# Patient Record
Sex: Female | Born: 1955
Health system: Southern US, Community
[De-identification: ages and names within clinical notes are randomized; demographics above are authoritative.]

## PROBLEM LIST (undated history)

## (undated) DIAGNOSIS — F329 Major depressive disorder, single episode, unspecified: Secondary | ICD-10-CM

## (undated) DIAGNOSIS — G44209 Tension-type headache, unspecified, not intractable: Secondary | ICD-10-CM

## (undated) DIAGNOSIS — T7840XA Allergy, unspecified, initial encounter: Secondary | ICD-10-CM

## (undated) DIAGNOSIS — I10 Essential (primary) hypertension: Secondary | ICD-10-CM

## (undated) DIAGNOSIS — F32A Depression, unspecified: Secondary | ICD-10-CM

## (undated) DIAGNOSIS — K635 Polyp of colon: Secondary | ICD-10-CM

## (undated) DIAGNOSIS — M1990 Unspecified osteoarthritis, unspecified site: Secondary | ICD-10-CM

## (undated) DIAGNOSIS — K529 Noninfective gastroenteritis and colitis, unspecified: Secondary | ICD-10-CM

## (undated) DIAGNOSIS — E785 Hyperlipidemia, unspecified: Secondary | ICD-10-CM

## (undated) DIAGNOSIS — K5792 Diverticulitis of intestine, part unspecified, without perforation or abscess without bleeding: Secondary | ICD-10-CM

## (undated) DIAGNOSIS — K589 Irritable bowel syndrome without diarrhea: Secondary | ICD-10-CM

## (undated) DIAGNOSIS — K5790 Diverticulosis of intestine, part unspecified, without perforation or abscess without bleeding: Secondary | ICD-10-CM

## (undated) DIAGNOSIS — F419 Anxiety disorder, unspecified: Secondary | ICD-10-CM

## (undated) DIAGNOSIS — Q2730 Arteriovenous malformation, site unspecified: Secondary | ICD-10-CM

## (undated) HISTORY — PX: SHOULDER SURGERY: SHX246

## (undated) HISTORY — DX: Arteriovenous malformation, site unspecified: Q27.30

## (undated) HISTORY — DX: Diverticulitis of intestine, part unspecified, without perforation or abscess without bleeding: K57.92

## (undated) HISTORY — PX: REDUCTION MAMMAPLASTY: SUR839

## (undated) HISTORY — PX: UPPER GASTROINTESTINAL ENDOSCOPY: SHX188

## (undated) HISTORY — DX: Irritable bowel syndrome, unspecified: K58.9

## (undated) HISTORY — DX: Tension-type headache, unspecified, not intractable: G44.209

## (undated) HISTORY — DX: Hyperlipidemia, unspecified: E78.5

## (undated) HISTORY — PX: ORTHOPEDIC SURGERY: SHX850

## (undated) HISTORY — DX: Anxiety disorder, unspecified: F41.9

## (undated) HISTORY — PX: BREAST ENHANCEMENT SURGERY: SHX7

## (undated) HISTORY — PX: COLONOSCOPY: SHX174

## (undated) HISTORY — DX: Major depressive disorder, single episode, unspecified: F32.9

## (undated) HISTORY — DX: Unspecified osteoarthritis, unspecified site: M19.90

## (undated) HISTORY — PX: BREAST SURGERY: SHX581

## (undated) HISTORY — PX: BREAST EXCISIONAL BIOPSY: SUR124

## (undated) HISTORY — DX: Allergy, unspecified, initial encounter: T78.40XA

## (undated) HISTORY — PX: ABDOMINAL HYSTERECTOMY: SHX81

## (undated) HISTORY — DX: Polyp of colon: K63.5

## (undated) HISTORY — DX: Essential (primary) hypertension: I10

## (undated) HISTORY — DX: Diverticulosis of intestine, part unspecified, without perforation or abscess without bleeding: K57.90

## (undated) HISTORY — DX: Depression, unspecified: F32.A

## (undated) HISTORY — PX: TUBAL LIGATION: SHX77

## (undated) HISTORY — DX: Noninfective gastroenteritis and colitis, unspecified: K52.9

---

## 1978-04-23 HISTORY — PX: BREAST SURGERY: SHX581

## 1983-04-24 HISTORY — PX: ABDOMINAL HYSTERECTOMY: SHX81

## 1998-04-23 HISTORY — PX: BUNIONECTOMY: SHX129

## 1998-04-23 HISTORY — PX: SHOULDER SURGERY: SHX246

## 1998-05-23 ENCOUNTER — Ambulatory Visit (HOSPITAL_COMMUNITY): Admission: RE | Admit: 1998-05-23 | Discharge: 1998-05-23 | Payer: Self-pay | Admitting: Obstetrics

## 1998-05-23 ENCOUNTER — Encounter: Payer: Self-pay | Admitting: Obstetrics

## 1999-05-10 ENCOUNTER — Other Ambulatory Visit: Admission: RE | Admit: 1999-05-10 | Discharge: 1999-05-10 | Payer: Self-pay | Admitting: Obstetrics

## 2000-08-30 ENCOUNTER — Ambulatory Visit (HOSPITAL_BASED_OUTPATIENT_CLINIC_OR_DEPARTMENT_OTHER): Admission: RE | Admit: 2000-08-30 | Discharge: 2000-08-30 | Payer: Self-pay | Admitting: Orthopedic Surgery

## 2000-10-03 ENCOUNTER — Other Ambulatory Visit: Admission: RE | Admit: 2000-10-03 | Discharge: 2000-10-03 | Payer: Self-pay | Admitting: Obstetrics

## 2000-10-16 ENCOUNTER — Encounter: Payer: Self-pay | Admitting: Obstetrics

## 2000-10-16 ENCOUNTER — Ambulatory Visit (HOSPITAL_COMMUNITY): Admission: RE | Admit: 2000-10-16 | Discharge: 2000-10-16 | Payer: Self-pay | Admitting: Obstetrics

## 2003-01-04 ENCOUNTER — Ambulatory Visit (HOSPITAL_COMMUNITY): Admission: RE | Admit: 2003-01-04 | Discharge: 2003-01-04 | Payer: Self-pay | Admitting: Obstetrics

## 2003-01-04 ENCOUNTER — Encounter: Payer: Self-pay | Admitting: Obstetrics

## 2003-09-03 ENCOUNTER — Encounter: Payer: Self-pay | Admitting: Gastroenterology

## 2003-09-09 ENCOUNTER — Encounter: Payer: Self-pay | Admitting: Gastroenterology

## 2003-09-17 ENCOUNTER — Ambulatory Visit (HOSPITAL_COMMUNITY): Admission: RE | Admit: 2003-09-17 | Discharge: 2003-09-17 | Payer: Self-pay | Admitting: Gastroenterology

## 2004-05-10 ENCOUNTER — Ambulatory Visit: Payer: Self-pay | Admitting: Internal Medicine

## 2004-06-30 ENCOUNTER — Ambulatory Visit: Payer: Self-pay | Admitting: Internal Medicine

## 2004-07-17 ENCOUNTER — Ambulatory Visit: Payer: Self-pay | Admitting: Internal Medicine

## 2005-01-08 ENCOUNTER — Ambulatory Visit: Payer: Self-pay | Admitting: Internal Medicine

## 2005-02-05 ENCOUNTER — Ambulatory Visit: Payer: Self-pay | Admitting: Internal Medicine

## 2005-02-08 ENCOUNTER — Ambulatory Visit: Payer: Self-pay | Admitting: Internal Medicine

## 2005-05-04 ENCOUNTER — Ambulatory Visit: Payer: Self-pay | Admitting: Internal Medicine

## 2005-06-08 ENCOUNTER — Ambulatory Visit: Payer: Self-pay | Admitting: Internal Medicine

## 2005-06-27 ENCOUNTER — Ambulatory Visit (HOSPITAL_COMMUNITY): Admission: RE | Admit: 2005-06-27 | Discharge: 2005-06-27 | Payer: Self-pay | Admitting: Internal Medicine

## 2005-07-06 ENCOUNTER — Ambulatory Visit (HOSPITAL_BASED_OUTPATIENT_CLINIC_OR_DEPARTMENT_OTHER): Admission: RE | Admit: 2005-07-06 | Discharge: 2005-07-06 | Payer: Self-pay | Admitting: Internal Medicine

## 2005-07-25 ENCOUNTER — Ambulatory Visit: Payer: Self-pay | Admitting: Pulmonary Disease

## 2005-09-14 ENCOUNTER — Ambulatory Visit: Payer: Self-pay | Admitting: Internal Medicine

## 2005-12-13 ENCOUNTER — Ambulatory Visit: Payer: Self-pay | Admitting: Internal Medicine

## 2006-06-26 ENCOUNTER — Ambulatory Visit: Payer: Self-pay | Admitting: Internal Medicine

## 2006-06-26 LAB — CONVERTED CEMR LAB
ALT: 23 units/L (ref 0–40)
AST: 16 units/L (ref 0–37)
Albumin: 4.2 g/dL (ref 3.5–5.2)
Alkaline Phosphatase: 98 units/L (ref 39–117)
BUN: 11 mg/dL (ref 6–23)
Basophils Absolute: 0.1 10*3/uL (ref 0.0–0.1)
Basophils Relative: 0.8 % (ref 0.0–1.0)
Bilirubin Urine: NEGATIVE
Bilirubin, Direct: 0.1 mg/dL (ref 0.0–0.3)
CO2: 30 meq/L (ref 19–32)
Calcium: 9.4 mg/dL (ref 8.4–10.5)
Chloride: 104 meq/L (ref 96–112)
Cholesterol: 320 mg/dL (ref 0–200)
Creatinine, Ser: 0.6 mg/dL (ref 0.4–1.2)
Direct LDL: 258 mg/dL
Eosinophils Absolute: 0 10*3/uL (ref 0.0–0.6)
Eosinophils Relative: 0.4 % (ref 0.0–5.0)
GFR calc Af Amer: 135 mL/min
GFR calc non Af Amer: 112 mL/min
Glucose, Bld: 101 mg/dL — ABNORMAL HIGH (ref 70–99)
HCT: 42.5 % (ref 36.0–46.0)
HDL: 37.9 mg/dL — ABNORMAL LOW (ref >39.0)
Hemoglobin, Urine: NEGATIVE
Hemoglobin: 14.1 g/dL (ref 12.0–15.0)
Ketones, ur: NEGATIVE mg/dL
Leukocytes, UA: NEGATIVE
Lymphocytes Relative: 24.4 % (ref 12.0–46.0)
MCHC: 33.2 g/dL (ref 30.0–36.0)
MCV: 90.9 fL (ref 78.0–100.0)
Monocytes Absolute: 0.7 10*3/uL (ref 0.2–0.7)
Monocytes Relative: 6.2 % (ref 3.0–11.0)
Neutro Abs: 7.5 10*3/uL (ref 1.4–7.7)
Neutrophils Relative %: 68.2 % (ref 43.0–77.0)
Nitrite: NEGATIVE
Platelets: 301 10*3/uL (ref 150–400)
Potassium: 3.6 meq/L (ref 3.5–5.1)
RBC: 4.67 M/uL (ref 3.87–5.11)
RDW: 13.1 % (ref 11.5–14.6)
Sed Rate: 48 mm/hr — ABNORMAL HIGH (ref 0–25)
Sodium: 142 meq/L (ref 135–145)
Specific Gravity, Urine: 1.015 (ref 1.000–1.03)
TSH: 0.89 microintl units/mL (ref 0.35–5.50)
Total Bilirubin: 1.2 mg/dL (ref 0.3–1.2)
Total CHOL/HDL Ratio: 8.4
Total Protein, Urine: NEGATIVE mg/dL
Total Protein: 7.8 g/dL (ref 6.0–8.3)
Triglycerides: 102 mg/dL (ref 0–149)
Urine Glucose: NEGATIVE mg/dL
Urobilinogen, UA: 0.2 (ref 0.0–1.0)
VLDL: 20 mg/dL (ref 0–40)
WBC: 11 10*3/uL — ABNORMAL HIGH (ref 4.5–10.5)
pH: 7 (ref 5.0–8.0)

## 2006-07-19 ENCOUNTER — Encounter: Admission: RE | Admit: 2006-07-19 | Discharge: 2006-07-19 | Payer: Self-pay | Admitting: Internal Medicine

## 2006-07-19 ENCOUNTER — Ambulatory Visit: Payer: Self-pay | Admitting: Internal Medicine

## 2006-08-30 ENCOUNTER — Ambulatory Visit: Payer: Self-pay | Admitting: Internal Medicine

## 2006-11-26 ENCOUNTER — Ambulatory Visit: Payer: Self-pay | Admitting: Internal Medicine

## 2006-11-26 LAB — CONVERTED CEMR LAB
ALT: 21 units/L (ref 0–35)
AST: 17 units/L (ref 0–37)
Albumin: 3.9 g/dL (ref 3.5–5.2)
Alkaline Phosphatase: 87 units/L (ref 39–117)
BUN: 13 mg/dL (ref 6–23)
Basophils Absolute: 0 10*3/uL (ref 0.0–0.1)
Basophils Relative: 0.6 % (ref 0.0–1.0)
Bilirubin Urine: NEGATIVE
Bilirubin, Direct: 0.2 mg/dL (ref 0.0–0.3)
CO2: 32 meq/L (ref 19–32)
Calcium: 9.8 mg/dL (ref 8.4–10.5)
Chloride: 107 meq/L (ref 96–112)
Cholesterol: 318 mg/dL (ref 0–200)
Creatinine, Ser: 0.7 mg/dL (ref 0.4–1.2)
Direct LDL: 254.5 mg/dL
Eosinophils Absolute: 0.1 10*3/uL (ref 0.0–0.6)
Eosinophils Relative: 2.2 % (ref 0.0–5.0)
GFR calc Af Amer: 113 mL/min
GFR calc non Af Amer: 93 mL/min
Glucose, Bld: 99 mg/dL (ref 70–99)
HCT: 41.2 % (ref 36.0–46.0)
HDL: 39.4 mg/dL (ref >39.0)
Hemoglobin, Urine: NEGATIVE
Hemoglobin: 14 g/dL (ref 12.0–15.0)
Ketones, ur: NEGATIVE mg/dL
Leukocytes, UA: NEGATIVE
Lymphocytes Relative: 44.2 % (ref 12.0–46.0)
MCHC: 34 g/dL (ref 30.0–36.0)
MCV: 89.9 fL (ref 78.0–100.0)
Monocytes Absolute: 0.4 10*3/uL (ref 0.2–0.7)
Monocytes Relative: 7.3 % (ref 3.0–11.0)
Neutro Abs: 2.7 10*3/uL (ref 1.4–7.7)
Neutrophils Relative %: 45.7 % (ref 43.0–77.0)
Nitrite: NEGATIVE
Platelets: 325 10*3/uL (ref 150–400)
Potassium: 4.1 meq/L (ref 3.5–5.1)
RBC: 4.58 M/uL (ref 3.87–5.11)
RDW: 12.9 % (ref 11.5–14.6)
Sed Rate: 62 mm/hr — ABNORMAL HIGH (ref 0–25)
Sodium: 145 meq/L (ref 135–145)
Specific Gravity, Urine: 1.015 (ref 1.000–1.03)
TSH: 1.05 microintl units/mL (ref 0.35–5.50)
Total Bilirubin: 1.4 mg/dL — ABNORMAL HIGH (ref 0.3–1.2)
Total CHOL/HDL Ratio: 8.1
Total Protein, Urine: NEGATIVE mg/dL
Total Protein: 7.6 g/dL (ref 6.0–8.3)
Triglycerides: 75 mg/dL (ref 0–149)
Urine Glucose: NEGATIVE mg/dL
Urobilinogen, UA: 0.2 (ref 0.0–1.0)
VLDL: 15 mg/dL (ref 0–40)
WBC: 6 10*3/uL (ref 4.5–10.5)
pH: 7.5 (ref 5.0–8.0)

## 2007-08-21 ENCOUNTER — Encounter: Admission: RE | Admit: 2007-08-21 | Discharge: 2007-08-21 | Payer: Self-pay | Admitting: Internal Medicine

## 2007-12-05 DIAGNOSIS — G44209 Tension-type headache, unspecified, not intractable: Secondary | ICD-10-CM | POA: Insufficient documentation

## 2007-12-05 DIAGNOSIS — M19019 Primary osteoarthritis, unspecified shoulder: Secondary | ICD-10-CM | POA: Insufficient documentation

## 2007-12-05 DIAGNOSIS — I10 Essential (primary) hypertension: Secondary | ICD-10-CM | POA: Insufficient documentation

## 2007-12-05 DIAGNOSIS — M1710 Unilateral primary osteoarthritis, unspecified knee: Secondary | ICD-10-CM

## 2007-12-05 DIAGNOSIS — E669 Obesity, unspecified: Secondary | ICD-10-CM | POA: Insufficient documentation

## 2007-12-05 DIAGNOSIS — IMO0002 Reserved for concepts with insufficient information to code with codable children: Secondary | ICD-10-CM | POA: Insufficient documentation

## 2007-12-05 DIAGNOSIS — K5730 Diverticulosis of large intestine without perforation or abscess without bleeding: Secondary | ICD-10-CM | POA: Insufficient documentation

## 2007-12-08 ENCOUNTER — Ambulatory Visit: Payer: Self-pay | Admitting: Internal Medicine

## 2007-12-08 LAB — CONVERTED CEMR LAB
Bilirubin Urine: NEGATIVE
Hemoglobin, Urine: NEGATIVE
Ketones, ur: NEGATIVE mg/dL
Leukocytes, UA: NEGATIVE
Nitrite: NEGATIVE
Protein, ur: NEGATIVE mg/dL
Specific Gravity, Urine: 1.012 (ref 1.005–1.03)
Urine Glucose: NEGATIVE mg/dL
Urobilinogen, UA: 0.2 (ref 0.0–1.0)
Vit D, 1,25-Dihydroxy: 5 — ABNORMAL LOW (ref 30–89)
pH: 7 (ref 5.0–8.0)

## 2007-12-09 LAB — CONVERTED CEMR LAB
ALT: 26 units/L (ref 0–35)
AST: 18 units/L (ref 0–37)
Albumin: 4.2 g/dL (ref 3.5–5.2)
Alkaline Phosphatase: 112 units/L (ref 39–117)
BUN: 12 mg/dL (ref 6–23)
Basophils Absolute: 0.1 10*3/uL (ref 0.0–0.1)
Basophils Relative: 0.8 % (ref 0.0–3.0)
Bilirubin, Direct: 0.1 mg/dL (ref 0.0–0.3)
CO2: 32 meq/L (ref 19–32)
Calcium: 9.4 mg/dL (ref 8.4–10.5)
Chloride: 108 meq/L (ref 96–112)
Creatinine, Ser: 0.9 mg/dL (ref 0.4–1.2)
Eosinophils Absolute: 0.2 10*3/uL (ref 0.0–0.7)
Eosinophils Relative: 2.7 % (ref 0.0–5.0)
GFR calc Af Amer: 84 mL/min
GFR calc non Af Amer: 69 mL/min
Glucose, Bld: 106 mg/dL — ABNORMAL HIGH (ref 70–99)
HCT: 41.9 % (ref 36.0–46.0)
Hemoglobin: 14.8 g/dL (ref 12.0–15.0)
Lymphocytes Relative: 44 % (ref 12.0–46.0)
MCHC: 35.2 g/dL (ref 30.0–36.0)
MCV: 90 fL (ref 78.0–100.0)
Monocytes Absolute: 0.5 10*3/uL (ref 0.1–1.0)
Monocytes Relative: 6.8 % (ref 3.0–12.0)
Neutro Abs: 3 10*3/uL (ref 1.4–7.7)
Neutrophils Relative %: 45.7 % (ref 43.0–77.0)
Phosphorus: 3.4 mg/dL (ref 2.3–4.6)
Platelets: 303 10*3/uL (ref 150–400)
Potassium: 3.7 meq/L (ref 3.5–5.1)
RBC: 4.66 M/uL (ref 3.87–5.11)
RDW: 13.7 % (ref 11.5–14.6)
Sodium: 142 meq/L (ref 135–145)
TSH: 1.75 microintl units/mL (ref 0.35–5.50)
Total Bilirubin: 0.9 mg/dL (ref 0.3–1.2)
Total Protein: 7.5 g/dL (ref 6.0–8.3)
WBC: 6.7 10*3/uL (ref 4.5–10.5)

## 2008-11-02 ENCOUNTER — Telehealth (INDEPENDENT_AMBULATORY_CARE_PROVIDER_SITE_OTHER): Payer: Self-pay | Admitting: *Deleted

## 2010-04-30 ENCOUNTER — Encounter (INDEPENDENT_AMBULATORY_CARE_PROVIDER_SITE_OTHER): Payer: Self-pay | Admitting: *Deleted

## 2010-04-30 ENCOUNTER — Emergency Department (HOSPITAL_COMMUNITY)
Admission: EM | Admit: 2010-04-30 | Discharge: 2010-04-30 | Payer: Self-pay | Source: Home / Self Care | Admitting: Emergency Medicine

## 2010-05-01 ENCOUNTER — Encounter (INDEPENDENT_AMBULATORY_CARE_PROVIDER_SITE_OTHER): Payer: Self-pay | Admitting: *Deleted

## 2010-05-08 LAB — URINALYSIS, ROUTINE W REFLEX MICROSCOPIC
Bilirubin Urine: NEGATIVE
Hgb urine dipstick: NEGATIVE
Ketones, ur: NEGATIVE mg/dL
Nitrite: NEGATIVE
Protein, ur: NEGATIVE mg/dL
Specific Gravity, Urine: 1.023 (ref 1.005–1.030)
Urine Glucose, Fasting: NEGATIVE mg/dL
Urobilinogen, UA: 0.2 mg/dL (ref 0.0–1.0)
pH: 6 (ref 5.0–8.0)

## 2010-05-08 LAB — DIFFERENTIAL
Basophils Absolute: 0 10*3/uL (ref 0.0–0.1)
Basophils Relative: 0 % (ref 0–1)
Eosinophils Absolute: 0.4 10*3/uL (ref 0.0–0.7)
Eosinophils Relative: 4 % (ref 0–5)
Lymphocytes Relative: 40 % (ref 12–46)
Lymphs Abs: 4.3 10*3/uL — ABNORMAL HIGH (ref 0.7–4.0)
Monocytes Absolute: 0.9 10*3/uL (ref 0.1–1.0)
Monocytes Relative: 8 % (ref 3–12)
Neutro Abs: 5.2 10*3/uL (ref 1.7–7.7)
Neutrophils Relative %: 48 % (ref 43–77)

## 2010-05-08 LAB — COMPREHENSIVE METABOLIC PANEL
ALT: 30 U/L (ref 0–35)
AST: 20 U/L (ref 0–37)
Albumin: 3.6 g/dL (ref 3.5–5.2)
Alkaline Phosphatase: 88 U/L (ref 39–117)
BUN: 16 mg/dL (ref 6–23)
CO2: 24 mEq/L (ref 19–32)
Calcium: 8.7 mg/dL (ref 8.4–10.5)
Chloride: 107 mEq/L (ref 96–112)
Creatinine, Ser: 0.77 mg/dL (ref 0.4–1.2)
GFR calc Af Amer: >60 mL/min (ref >60)
GFR calc non Af Amer: >60 mL/min (ref >60)
Glucose, Bld: 100 mg/dL — ABNORMAL HIGH (ref 70–99)
Potassium: 3.7 mEq/L (ref 3.5–5.1)
Sodium: 139 mEq/L (ref 135–145)
Total Bilirubin: 0.7 mg/dL (ref 0.3–1.2)
Total Protein: 6.4 g/dL (ref 6.0–8.3)

## 2010-05-08 LAB — CBC
HCT: 38.4 % (ref 36.0–46.0)
Hemoglobin: 12.8 g/dL (ref 12.0–15.0)
MCH: 29.6 pg (ref 26.0–34.0)
MCHC: 33.3 g/dL (ref 30.0–36.0)
MCV: 88.7 fL (ref 78.0–100.0)
Platelets: 280 10*3/uL (ref 150–400)
RBC: 4.33 MIL/uL (ref 3.87–5.11)
RDW: 14.6 % (ref 11.5–15.5)
WBC: 10.9 10*3/uL — ABNORMAL HIGH (ref 4.0–10.5)

## 2010-05-08 LAB — LIPASE, BLOOD: Lipase: 33 U/L (ref 11–59)

## 2010-05-20 LAB — BASIC METABOLIC PANEL
BUN: 23 mg/dL — AB (ref 4–21)
Creatinine: 0.8 mg/dL (ref 0.5–1.1)
Glucose: 92 mg/dL
Potassium: 4 mmol/L (ref 3.4–5.3)
Sodium: 141 mmol/L (ref 137–147)

## 2010-05-20 LAB — LIPID PANEL
Cholesterol: 310 mg/dL — AB (ref 0–200)
HDL: 39 mg/dL (ref 35–70)
LDL Cholesterol: 253 mg/dL
LDl/HDL Ratio: 7.9
Triglycerides: 92 mg/dL (ref 40–160)

## 2010-05-25 NOTE — Letter (Signed)
Summary: New Patient letter  [facility]  [address] [place], [place] [address]   Phone: [phone]  Fax: [phone]       05/01/2010 MRN: [id number]  Krystal Gibson [address] [place], [place]  [address]  Dear Krystal Gibson,  Welcome to the Gastroenterology Division at [facility].    You are scheduled to see Dr.  Russella Dar on 06-09-10 at 3:30p.m. on the 3rd floor at [facility], [address]  We ask that you try to arrive at our office 15 minutes prior to your appointment time to allow for check-in.  We would like you to complete the enclosed self-administered evaluation form prior to your visit and bring it with you on the day of your appointment.  We will review it with you.  Also, please bring a complete list of all your medications or, if you prefer, bring the medication bottles and we will list them.  Please bring your insurance card so that we may make a copy of it.  If your insurance requires a referral to see a specialist, please bring your referral form from your primary care physician.  Co-payments are due at the time of your visit and may be paid by cash, check or credit card.     Your office visit will consist of a consult with your physician (includes a physical exam), any laboratory testing he/she may order, scheduling of any necessary diagnostic testing (e.g. x-ray, ultrasound, CT-scan), and scheduling of a procedure (e.g. Endoscopy, Colonoscopy) if required.  Please allow enough time on your schedule to allow for any/all of these possibilities.    If you cannot keep your appointment, please call [phone] to cancel or reschedule prior to your appointment date.  This allows [place] the opportunity to schedule an appointment for another patient in need of care.  If you do not cancel or reschedule by 5 p.m. the business day prior to your appointment date, you will be charged a $50.00 late cancellation/no-show fee.    Thank you for choosing  [facility] for your medical needs.  We appreciate the opportunity to care for you.  Please visit [place] at our website  to learn more about our practice.                     Sincerely,                                                             The Gastroenterology Division

## 2010-06-08 ENCOUNTER — Other Ambulatory Visit: Payer: Self-pay | Admitting: Family Medicine

## 2010-06-09 ENCOUNTER — Encounter: Payer: Self-pay | Admitting: Gastroenterology

## 2010-06-09 ENCOUNTER — Ambulatory Visit (INDEPENDENT_AMBULATORY_CARE_PROVIDER_SITE_OTHER): Payer: BC Managed Care – PPO | Admitting: Gastroenterology

## 2010-06-09 DIAGNOSIS — K5732 Diverticulitis of large intestine without perforation or abscess without bleeding: Secondary | ICD-10-CM

## 2010-06-09 DIAGNOSIS — K589 Irritable bowel syndrome without diarrhea: Secondary | ICD-10-CM

## 2010-06-12 ENCOUNTER — Other Ambulatory Visit: Payer: Self-pay | Admitting: Gastroenterology

## 2010-06-12 ENCOUNTER — Other Ambulatory Visit (AMBULATORY_SURGERY_CENTER): Payer: BC Managed Care – PPO | Admitting: Gastroenterology

## 2010-06-12 DIAGNOSIS — R197 Diarrhea, unspecified: Secondary | ICD-10-CM

## 2010-06-12 DIAGNOSIS — K5289 Other specified noninfective gastroenteritis and colitis: Secondary | ICD-10-CM

## 2010-06-12 DIAGNOSIS — D126 Benign neoplasm of colon, unspecified: Secondary | ICD-10-CM

## 2010-06-12 DIAGNOSIS — K5730 Diverticulosis of large intestine without perforation or abscess without bleeding: Secondary | ICD-10-CM

## 2010-06-14 NOTE — Letter (Signed)
Summary: Out of Work  [facility]  [address] [place], [place] [address]   Phone: [phone]  Fax: [phone]    June 09, 2010   Employee:  Krystal Gibson    To Whom It May Concern:   For Medical reasons, please excuse the above named employee from work for the following dates:  Start:   06/11/10  End:   06/12/10  If you need additional information, please feel free to contact our office.         Sincerely,    Christie Nottingham CMA (AAMA)

## 2010-06-14 NOTE — Assessment & Plan Note (Signed)
Summary: Gastroenterology  [place]   MR#:  [id number] Page #  NAME:  Krystal Gibson, Krystal Gibson    OFFICE NO:  [id number]  DATE:  09/03/03  DOB:  April 03, 2056  REFERRING PHYSICIAN:  Dr. Sandrea Hughs   REASON FOR REFERRAL:  Diarrhea and abdominal pain.  HISTORY OF PRESENT ILLNESS:  The patient is a 55 year old African-American female that I saw in the past in 1992 and 1993 for alternating diarrhea and constipation.  Irritable bowel syndrome was suspected.  The patient states that over the past 10 years she generally has normal bowel movements to occasional diarrhea.  Her symptoms are often associated with some mild left lower quadrant pain.  For the past 6 months her symptoms have substantially worsened and her diarrhea is occurring much more frequently.  She has occasional diarrhea that wakes her from sleep between 3 and 4 in the morning.  She notes that her symptoms are frequently brought on by meals and occasionally she has sweats associated with her diarrhea.  She sometimes has up to 6 or 7 bowel movements a day.  This is associated with a crampy type left lower quadrant pain.  She notes no recent antibiotic usage except she may have had preoperative antibiotics with right shoulder surgery, performed by Dr. Cleophas Dunker on July 01, 2003.  She denies any melena, hematochezia, rectal pain, nausea, vomiting, fevers, chills, weight loss, or change in stool caliber.  No family history of colon cancer, colon polyps, or inflammatory bowel disease.  She did have a flexible sigmoidoscopy performed in August of 1992 that revealed scattered diverticulosis and external hemorrhoids.    PAST MEDICAL HISTORY:  Borderline hypertension, status post right shoulder surgery in March 2005, status post bilateral carpal tunnel releases, status post surgical repair of a dislocated right thumb, status post left foot surgery, irritable bowel syndrome, status post hysterectomy, hyperlipidemia.  CURRENT MEDICATIONS:  Ibuprofen p.r.n.  MEDICATION  ALLERGIES:  Sulfa drugs.  SOCIAL HISTORY:  She is divorced with 2 children.  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  She is a cigarette smoker and denies alcohol usage.   REVIEW OF SYSTEMS:  Remarkable for right shoulder pain and sleeping problems.  The remainder of her review of systems is entirely negative.   PHYSICAL EXAMINATION:  No acute distress.  Height 5 feet 6 inches.  Weight 176 pounds.  Blood pressure is 132/82.  Pulse is 64 and regular.  HEENT exam:  Anicteric sclerae.  Oropharynx clear.  Chest:  Clear to auscultation and percussion.  Cardiac:  Regular rate and rhythm without murmurs.  Abdomen is soft, nondistended, with mild left lower quadrant tenderness to deep palpation.  No rebound or guarding.  No palpable organomegaly, masses, or hernias.  Normoactive bowel sounds.  Rectal examination deferred to the time of colonoscopy.  Extremities without clubbing, cyanosis, or edema.  Neurologic:  Grossly nonfocal.  ASSESSMENT AND PLAN:  Worsening diarrhea with associated left lower quadrant pain.  Rule out inflammatory bowel disease, colorectal neoplasms, occult infection, and irritable bowel syndrome.  We will obtain a CBC, CMET, erythrocyte sedimentation rate, TSH, stool Hemoccults, and stool studies for Clostridium difficile, ova and parasites, and enteric pathogens.  She is to begin a trial of lactose product avoidance.  If the above evaluation is unremarkable, we will plan to proceed with colonoscopy for further evaluation.  Risks, benefits, and alternatives to colonoscopy with possible biopsy and possible polypectomy discussed with the patient and she consents to proceed if this is  indeed required.  In the interim, we will begin a trial of Robinul Forte one-half to one whole tablet b.i.d. p.r.n.        Venita Lick. Russella Dar, M.D., F.A.C.G.  [id number] cc:  Dr. Sandrea Hughs  D:  09/07/03; T:  ; Job [id number]

## 2010-06-14 NOTE — Letter (Signed)
Summary: [facility] Instructions  [facility]  [address] [place], [place] [address]   Phone: [phone]  Fax: [phone]       Krystal Gibson    1955-09-19    MRN: [id number]        Procedure Day /Date: Monday Wayne Sever 20th, 2012     Arrival Time: 8:30am     Procedure Time: 9:30am     Location of Procedure:                    _ x_  [facility] (4th Floor)                        PREPARATION FOR COLONOSCOPY WITH MOVIPREP   Starting 5 days prior to your procedure today do not eat nuts, seeds, popcorn, corn, beans, peas,  salads, or any raw vegetables.  Do not take any fiber supplements (e.g. Metamucil, Citrucel, and Benefiber).  THE DAY BEFORE YOUR PROCEDURE         DATE: 06/11/10  DAY: Sunday  1.  Drink clear liquids the entire day-NO SOLID FOOD  2.  Do not drink anything colored red or purple.  Avoid juices with pulp.  No orange juice.  3.  Drink at least 64 oz. (8 glasses) of fluid/clear liquids during the day to prevent dehydration and help the prep work efficiently.  CLEAR LIQUIDS INCLUDE: Water Jello Ice Popsicles Tea (sugar ok, no milk/cream) Powdered fruit flavored drinks Coffee (sugar ok, no milk/cream) Gatorade Juice: apple, white grape, white cranberry  Lemonade Clear bullion, consomm, broth Carbonated beverages (any kind) Strained chicken noodle soup Hard Candy                             4.  In the morning, mix first dose of MoviPrep solution:    Empty 1 Pouch A and 1 Pouch B into the disposable container    Add lukewarm drinking water to the top line of the container. Mix to dissolve    Refrigerate (mixed solution should be used within 24 hrs)  5.  Begin drinking the prep at 5:00 p.m. The MoviPrep container is divided by 4 marks.   Every 15 minutes drink the solution down to the next mark (approximately 8 oz) until the full liter is complete.   6.  Follow completed prep with 16 oz of clear liquid of your choice  (Nothing red or purple).  Continue to drink clear liquids until bedtime.  7.  Before going to bed, mix second dose of MoviPrep solution:    Empty 1 Pouch A and 1 Pouch B into the disposable container    Add lukewarm drinking water to the top line of the container. Mix to dissolve    Refrigerate  THE DAY OF YOUR PROCEDURE      DATE: 06/12/10 DAY: Monday  Beginning at 4:30 a.m. (5 hours before procedure):         1. Every 15 minutes, drink the solution down to the next mark (approx 8 oz) until the full liter is complete.  2. Follow completed prep with 16 oz. of clear liquid of your choice.    3. You may drink clear liquids until 7:30am (2 HOURS BEFORE PROCEDURE).   MEDICATION INSTRUCTIONS  Unless otherwise instructed, you should take regular prescription medications with a small sip of water   as early as possible the morning of your  procedure.         OTHER INSTRUCTIONS  You will need a responsible adult at least 57 years of age to accompany you and drive you home.   This person must remain in the waiting room during your procedure.  Wear loose fitting clothing that is easily removed.  Leave jewelry and other valuables at home.  However, you may wish to bring a book to read or  an iPod/MP3 player to listen to music as you wait for your procedure to start.  Remove all body piercing jewelry and leave at home.  Total time from sign-in until discharge is approximately 2-3 hours.  You should go home directly after your procedure and rest.  You can resume normal activities the  day after your procedure.  The day of your procedure you should not:   Drive   Make legal decisions   Operate machinery   Drink alcohol   Return to work  You will receive specific instructions about eating, activities and medications before you leave.    The above instructions have been reviewed and explained to me by   _______________________    I fully understand and can verbalize  these instructions _____________________________ Date _________

## 2010-06-14 NOTE — Assessment & Plan Note (Signed)
Summary[facility]IC[facility] A...   History of Present Illness Visit Type: consult Primary GI MD: Elie Goody MD [facility] Primary Provider: Janace Hoard, MD Requesting Provider: Eber Hong, MD Chief Complaint: Patient complains of explosive diarrhea and lower abdominal pain and tenderness. Patient is scared to eat due to diarrhea.  History of Present Illness:   This is a 55 year old female that I saw in the past for diverticulosis and irritable bowel syndrome.  Colonoscopy in May 2005 showing diverticulosis from the transverse colon to sigmoid colon. Random biopsies of the colon were negative.  She states that for the past 2 years she has had problems with alternating diarrhea constipation associated with  lower abdominal soreness and tenderness. She states she's been under more stress at work over the past several months and her symptoms have worsened.  She was seen in the emergency department for an episode of diverticulitis in January to review those records. She was treated with a seven-day course of Cipro and Flagyl with resolution of her acute symptoms.   GI Review of Systems    Reports abdominal pain and  bloating.     Location of  Abdominal pain: lower abdomen.    Denies acid reflux, belching, chest pain, dysphagia with liquids, dysphagia with solids, heartburn, loss of appetite, nausea, vomiting, vomiting blood, weight loss, and  weight gain.      Reports change in bowel habits, constipation, diarrhea, diverticulosis, and  irritable bowel syndrome.     Denies anal fissure, black tarry stools, fecal incontinence, heme positive stool, hemorrhoids, jaundice, light color stool, liver problems, rectal bleeding, and  rectal pain. Preventive Screening-Counseling & Management      Drug Use:  no.    Current Medications (verified): 1)  Aspirin Adult Low Strength 81 Mg  Tbec (Aspirin) .... Once Daily 2)  Lisinopril-Hydrochlorothiazide 20-12.5 Mg  Tabs (Lisinopril-Hydrochlorothiazide) .... Take One By Mouth Once Daily 3)  Citalopram Hydrobromide 20 Mg Tabs (Citalopram Hydrobromide) .... Take One By Mouth Once Daily 4)  Livalo 4 Mg Tabs (Pitavastatin Calcium) .... Take One By Mouth Once Daily 5)  Ambien 5 Mg Tabs (Zolpidem Tartrate) .... Take One By Mouth At Bedtime As Needed  Allergies (verified): 1)  ! Sulfa 2)  * Eye Drops  Past History:  Past Medical History: DIVERTICULAR DISEASE/IDiVERTICULITIS   - Colonoscpy 09/09/03  DEGENERATIVE JOINT DISEASE, RIGHT SHOULDER (ICD-715.91) OBESITY (ICD-278.00)   - Target wt  =  179  for BMI < 30  DEGENERATIVE JOINT DISEASE, BOTH KNEES, SEVERE (ICD-715.96) HYPERTENSION (ICD-401.9) HEADACHE, TENSION (ICD-307.81) Hyperlipidemia   -Target LDL < 130 smoker, hbp, pos fm hx Irritable Bowel Syndrome Hemorrhoids  Past Surgical History: Hysterectomy 1996 Right shoulder surgery 06/2003 Bilateral Carpal Tunnel Release Repair dislocated right thumb Left foot surgery Mamoplastric surgery 1980  Family History: Father pna expired Mother DM HBP brother with renal failure No FH of Colon Cancer:  Social History: Current smoker.  Started at age 89.  Smokes less and a 1/2 ppd trying to quit No ETOH [employer]. Daily Caffeine Use one per day Illicit Drug Use - no Drug Use:  no  Review of Systems       The patient complains of change in vision, cough, headaches-new, night sweats, and sore throat.         The pertinent positives and negatives are noted as above and in the HPI. All other ROS were reviewed and were negative.   Vital Signs:  Patient profile:  55 year old female Height:      66 inches Weight:      171.8 pounds BMI:     27.83 Pulse rate:   72 / minute Pulse rhythm:   regular BP sitting:   130 / 82  (left arm) Cuff size:   regular  Vitals Entered By: Harlow Mares CMA Duncan Dull) (June 09, 2010 3:46 PM)  Physical Exam  General:  Well developed, well nourished, no acute  distress. Head:  Normocephalic and atraumatic. Eyes:  PERRLA, no icterus. Ears:  Normal auditory acuity. Mouth:  No deformity or lesions, dentition normal. Neck:  Supple; no masses or thyromegaly. Lungs:  Clear throughout to auscultation. Heart:  Regular rate and rhythm; no murmurs, rubs,  or bruits. Abdomen:  Soft and nondistended. No masses, hepatosplenomegaly or hernias noted. Normal bowel sounds. Mild lower minimal tenderness to deep palpation without rebound or guarding. Rectal:  deferred until time of colonoscopy.   Msk:  Symmetrical with no gross deformities. Normal posture. Pulses:  Normal pulses noted. Extremities:  No clubbing, cyanosis, edema or deformities noted. Neurologic:  Alert and  oriented x4;  grossly normal neurologically. Cervical Nodes:  No significant cervical adenopathy. Inguinal Nodes:  No significant inguinal adenopathy. Psych:  Alert and cooperative. Normal mood and affect.  Impression & Recommendations:  Problem # 1:  DIVERTICULITIS OF COLON (ICD-562.11)  Recent episode of diverticulitis has clinically resolved. Long-term high fiber diet with fiber supplements and increased water intake Orders: Colonoscopy (Colon)  Problem # 2:  IBS (ICD-564.1)  I suspect the majority of her symptoms are related to irritable bowel syndrome. Above measures for diverticulosis. Begin Align and glycopyrrolate. Rule out colorectal neoplasms and inflammatory bowel disease with colonoscopy. The risks, benefits and alternatives to colonoscopy with possible biopsy and possible polypectomy were discussed with the patient and they consent to proceed. The procedure will be scheduled electively. Orders: Colonoscopy (Colon)  Patient Instructions: 1)  Colonoscopy brochure given.  2)  High Fiber, Low Fat  Healthy Eating Plan brochure given.  3)  Start Citracel Clear daily. 4)  Start Align samples one tablet  by mouth once daily x 1 month.  5)  Pick up your prescriptions from your  pharmacy.  6)  Copy sent to : Janace Hoard, MD 7)  The medication list was reviewed and reconciled.  All changed / newly prescribed medications were explained.  A complete medication list was provided to the patient / caregiver.  Prescriptions: ROBINUL 1 MG TABS (GLYCOPYRROLATE) one tablet by mouth two times a day  #60 x 11   Entered by:   Christie Nottingham CMA (AAMA)   Authorized by:   Meryl Dare MD [facility]   Signed by:   Christie Nottingham CMA (AAMA) on 06/09/2010   Method used:   Electronically to        [facility] [phone]* (retail)       [address] [place], [place]  [address]       Ph: [phone] or [phone]       Fax: [phone]   RxID:   [id number] MOVIPREP 100 GM  SOLR (PEG-KCL-NACL-NASULF-NA ASC-C) As per prep instructions.  #1 x 0   Entered by:   Christie Nottingham CMA (AAMA)   Authorized by:   Meryl Dare MD [facility]   Signed by:   Christie Nottingham CMA (AAMA) on 06/09/2010   Method used:   Electronically to  [facility]  [address] [facility] [phone]* (retail)       [address] [place], [place]  [address]       Ph: [phone] or [phone]       Fax: [phone]   RxID:   [id number]

## 2010-06-15 ENCOUNTER — Encounter: Payer: Self-pay | Admitting: Gastroenterology

## 2010-06-20 NOTE — Procedures (Signed)
Summary: Colonoscopy  Patient: Krystal Gibson Note: All result statuses are Final unless otherwise noted.  Tests: (1) Colonoscopy (COL)   COL Colonoscopy           DONE     [facility]     [address]     [place], [place]  [address]           COLONOSCOPY PROCEDURE REPORT           PATIENT:  Krystal Gibson, Krystal Gibson  MR#:  [id number]     BIRTHDATE:  July 19, 1955, 54 yrs. old  GENDER:  female     ENDOSCOPIST:  Judie Petit T. Russella Dar, MD, [facility]           PROCEDURE DATE:  06/12/2010     PROCEDURE:  Colonoscopy with snare polypectomy     ASA CLASS:  Class II     INDICATIONS:  1) diverticulitis  2) unexplained diarrhea     MEDICATIONS:   Fentanyl 50 mcg IV, Versed 6 mg IV     DESCRIPTION OF PROCEDURE:   After the risks benefits and     alternatives of the procedure were thoroughly explained, informed     consent was obtained.  Digital rectal exam was performed and     revealed no abnormalities.   The LB [id number]-[id number] endoscope     was introduced through the anus and advanced to the cecum, which     was identified by both the appendix and ileocecal valve, without     limitations.  The quality of the prep was good, using MoviPrep.     The instrument was then slowly withdrawn as the colon was fully     examined.     <<PROCEDUREIMAGES>>     FINDINGS:  Severe diverticulosis was found in the sigmoid to     transverse colon. Scattered diverticula were found in the cecum to     hepatic flexure. A sessile polyp was found in the ascending colon.     It was white and granular. Polyp was snared without cautery.     Retrieval was successful. Otherwise normal colonoscopy without     other polyps, masses, vascular ectasias, or inflammatory changes.     Retroflexed views in the rectum revealed no abnormalities. The     time to cecum =  1.33  minutes. The scope was then withdrawn (time     =  9.75  min) from the patient and the procedure completed.           COMPLICATIONS:  None        ENDOSCOPIC IMPRESSION:     1) Severe diverticulosis in the sigmoid to transverse colon     2) Diverticula, scattered in cecum to hepatic flexure     3) Sessile polyp in the ascending colon           RECOMMENDATIONS:     1) Await pathology results     2) High fiber diet with liberal fluid intake.     3) If the polyp is adenomatous (pre-cancerous), colonoscopy in 5     years. Otherwise follow colorectal cancer screening guidelines for     "routine risk" patients with colonoscopy in 10 years.           Venita Lick. Russella Dar, MD, [facility]           CC:  Janace Hoard, MD           n.     Rosalie DoctorJudie Petit  Dellie Burns at 06/12/2010 10:06 AM           Krystal Gibson, [id number]  Note: An exclamation mark (!) indicates a result that was not dispersed into the flowsheet. Document Creation Date: 06/12/2010 10:06 AM _______________________________________________________________________  (1) Order result status: Final Collection or observation date-time: 06/12/2010 09:58 Requested date-time:  Receipt date-time:  Reported date-time:  Referring Physician:   Ordering Physician: Claudette Head [phone]) Specimen Source:  Source: Launa Grill Order Number: [id number] Lab site:   Appended Document: Colonoscopy     Procedures Next Due Date:    Colonoscopy: 06/2020

## 2010-06-20 NOTE — Letter (Signed)
Summary: Patient Notice- Colon Biospy Results  [facility]  [address] [place], [place] [address]   Phone: [phone]  Fax: [phone]        June 15, 2010 MRN: [id number]    [facility] [address] [place], [place]  [address]    Dear Ms. Jablon,  I am pleased to inform you that the biopsies taken during your recent colonoscopy did not show any evidence of cancer upon pathologic examination. The biopsies showed focal colitis. This is likely a self limited (will resolve on its own) problem. There was no polyp tissue.  Continue with the treatment plan as outlined on the day of your exam.  You should have a repeat colonoscopy examination for colorectal cancer screening in 10 years.  Please call us if you are having persistent problems or have questions about your condition that have not been fully answered at this time.  Sincerely,  Meryl Dare MD [facility]  This letter has been electronically signed by your physician.

## 2010-07-18 ENCOUNTER — Other Ambulatory Visit: Payer: Self-pay | Admitting: Family Medicine

## 2010-07-18 DIAGNOSIS — Z1231 Encounter for screening mammogram for malignant neoplasm of breast: Secondary | ICD-10-CM

## 2010-07-28 ENCOUNTER — Ambulatory Visit
Admission: RE | Admit: 2010-07-28 | Discharge: 2010-07-28 | Disposition: A | Discharge status: Home / Self Care | Payer: BC Managed Care – PPO | Source: Ambulatory Visit | Attending: Family Medicine | Admitting: Family Medicine

## 2010-07-28 DIAGNOSIS — Z1231 Encounter for screening mammogram for malignant neoplasm of breast: Secondary | ICD-10-CM

## 2010-09-05 NOTE — Assessment & Plan Note (Signed)
[facility]                           PRIMARY CARE OFFICE NOTE   Krystal Gibson, Krystal Gibson                     MRN:          [id number]  DATE:11/26/2006                            DOB:          1955-12-09    This is a 55 year old black female active smoker whom I follow for  hypertension, mild degenerative arthritis and moderate obesity.  She  comes in acutely ill today with onset left lower abdominal discomfort  that occurred while sitting and watching a movie on August 1.  It was  associated with nausea, vomiting and diarrhea.  All of these problems  have resolved and she had a normal bowel movement this morning.  She has  been left with mild left abdominal and flank discomfort.  She denies any  dysuria or fever, unusual travel or exposure to potential tainted foods  and no one in her family has had similar problems.   PAST MEDICAL HISTORY:  1. Diverticulosis, see colonoscopy dated 09/09/2003.  Concern then was      that she had irritable bowel syndrome.  2. Status post hysterectomy.   ALLERGIES:  Possible eye drop allergies not otherwise specified.   MEDICATIONS:  Not consistent about using Citrucel as recommended on her  last visit.  Taking baby aspirin daily and Maxzide 25 1/2 daily.   SOCIAL HISTORY:  She continues to smoke but now down to 1/2 pack every  week.  She denies any alcohol use.  She works as an Airline pilot.  She  lives with her son who is in the 12th grade.   FAMILY HISTORY:  Positive for diabetes in her mother and hypertension in  her brother who also had renal failure.  Her mother had some form of  rheumatism. No premature heart disease or cancer in her family to her  knowledge.  No history of inflammatory bowel disease in her family to  her knowledge.   REVIEW OF SYSTEMS:  Taken in detail on the work sheet, negative except  as outlined above.   PHYSICAL EXAMINATION:  This is a mildly anxious and somewhat depressed  appearing,  ambulatory black female in no acute distress.  VITAL SIGNS:  Stable.  HEENT:  Unremarkable. Pharynx is clear. Neck is supple without cervical  adenopathy or tenderness. Dentition is intact. Ear canals clear  bilaterally. Nasal turbinates normal.  LUNG FIELDS:  Perfectly clear bilaterally to auscultation and  percussion.  HEART:  There is a regular rhythm without murmurs, rubs, or gallops.  ABDOMEN:  Is completely soft and benign with negative without palpable  organomegaly, masses or tenderness. Femoral pulses were present  bilaterally.  GENITOURINARY/RECTAL:  Per GYN.  EXTREMITIES:  Warm without calf tenderness, cyanosis, clubbing or edema.  NEUROLOGIC:  No focal deficits. Pathologic reflexes.  SKIN EXAM:  Warm and dry.   Chest x-ray is normal.  EKG is normal.  CBC is normal.  Sed rate was 62.  Chemistry profile is normal.  Total cholesterol is 318 with an LDL of  254 and HDL of 39.  TSH was normal.  Urinalysis was unremarkable.   IMPRESSION:  1. Acute  gastroenteritis resolving, with residual mild abdominal      discomfort but normal bowel movement today. Question IBS?  I recommend that she start back on Citrucel 1 tsp. b.i.d. regularly and  gave her dietary recommendations as well.  1. Severe hyperlipidemia with weight well in excess of target of 156.      Her main risk factor is hypertension with a negative family history      for premature disease.  She had been previously maintained on      Statin's but for reasons that are unclear is no longer taking these      medicines.  I am going to ask her to return in 2 weeks to review      these issues as well as make sure that her GI symptoms have totally      resolved or that GI f/u has been arranged.  2. General health maintenance.  She was updated in tetanus in 2002 as      well as Pneumovax.  She is due in the computer for recall for      colonoscopy and I have asked her to contact Dr. Ardell Isaacs office      directly for GI symptoms  continue after initiating Citrucel      perfectly regularly.     Charlaine Dalton. Sherene Sires, MD, [facility]  Electronically Signed    MBW/MedQ  DD: 11/26/2006  DT: 11/26/2006  Job #: [id number]

## 2010-09-05 NOTE — Assessment & Plan Note (Signed)
[facility]                             PULMONARY OFFICE NOTE   NAME:Krystal Gibson, Krystal Gibson                     MRN:          [id number]  DATE:08/30/2006                            DOB:          Dec 14, 1955    PRIMARY SERVICE/FOLLOWUP OFFICE VISIT:   HISTORY:  A very nice 55 year old black female, former smoker with  moderate obesity, complicated by hypertension and hyperlipidemia with a  target LDL less than 130, based on the fact that she has been a smoker,  has hypertension and positive family history.  She has been working  vigorously toward weight-loss and has enjoyed working out at [employer] on  a daily basis now with no exertional chest pain, orthopnea, PND, or leg-  swelling, TIA or claudication symptoms.   Most recently, she had difficulty with flare-up of abdominal pain, which  was presumed to be diverticulosis/diverticulitis, but this has been  eliminated now with Citrucel used as a maintenance medicine.  Her only  other maintenance medicine is baby aspirin daily and Maxzide 25 one half  daily.   PHYSICAL EXAM:  She is a pleasant, ambulatory, black woman, in no acute  distress with a blood pressure of 124/78.  HEENT:  Unremarkable.  Oropharynx is clear.  LUNG FIELDS:  Clear bilaterally to auscultation and percussion.  There is a regular rate and rhythm without murmur, gallop or rub.  ABDOMEN:  Soft, benign.  EXTREMITIES:  Warm without calf tenderness, cyanosis, clubbing or edema.   IMPRESSION:  1. Hypertension:  Well-controlled on present regimen.  2. Obesity with target weight of less than 156 to bring her BMI down      to less than 26 (she is at 28 now).  Discussed with the patient the      context of optimal calorie balance and the fact that it is going to      be more difficult to lose the extra weight.  3. Hyperlipidemia:  Should improve with weight-loss.  She is due for      comprehensive health care evaluation and will have a fasting  lipid      profile in August of 2008.     Charlaine Dalton. Sherene Sires, MD, [facility]  Electronically Signed    MBW/MedQ  DD: 08/30/2006  DT: 08/30/2006  Job #: [id number]

## 2010-09-08 NOTE — Assessment & Plan Note (Signed)
[facility]                               PULMONARY OFFICE NOTE   Krystal Gibson, Krystal Gibson                     MRN:          [id number]  DATE:12/13/2005                            DOB:          26-Jul-1955    PRIMARY SERVICE COMPREHENSIVE HEALTH CARE EVALUATION:   HISTORY:  This is a 55 year old black female active smoker, still actively  smoking, in for follow-up evaluation of hypertension and hyperlipidemia.  She denies any shortness of breath, chest pain, orthopnea, PND or leg  swelling.   Her medicines include baby aspirin one daily, Vytorin 10/40 mg one q.p.m.,  and Maxzide 25 one-half q.a.m., but did not take her medicines this morning.   PHYSICAL EXAMINATION:  GENERAL:  She is a pleasant, ambulatory black female  in no acute distress.  VITAL SIGNS:  Stable vital signs.  Blood pressure 136/90.  No change in her  height at 5 feet 4-12 inches.  Weight is 175 pounds, which is no change.  HEENT:  Unremarkable.  Pharynx clear.  Dentition intact.  NECK:  Supple without cervical adenopathy or tenderness.  Trachea is  midline.  LUNGS:  Lung fields are perfectly clear bilaterally to auscultation and  percussion.  CARDIAC:  Regular rhythm without murmur, gallop, or rub.  ABDOMEN:  Soft, benign.  EXTREMITIES:  Warm without calf tenderness, cyanosis, clubbing or edema.   LABORATORY DATA:  Normal chemistry profile.  Total cholesterol of 343 with  an LDL of 293 and an HDL of 36.   IMPRESSION:  Severe hyperlipidemia in a patient who is already maintained on  Vytorin, with relatively low HDL and high LDL.  Perhaps Advicor would be a  better choice for her than Vytorin.  Either one is going to represent a very  difficult situation as it will be difficult for her to afford, and I suspect  the reason that she is not well-controlled presently is that she is not  consistently taking her Vytorin because of insurance issues.   For now, therefore, I am going to  recommend she simply take the Vytorin  consistently for the next 3 months, use diet and exercise strategies as I  discussed before, and return for follow-up in 3 months for a fasting lipid  profile, and also continue a baby aspirin daily.                                   Charlaine Dalton. Sherene Sires, MD, [facility]   MBW/MedQ  DD:  12/13/2005  DT:  12/14/2005  Job #:  [id number]

## 2010-09-08 NOTE — Assessment & Plan Note (Signed)
[facility]                             PULMONARY OFFICE NOTE   Krystal, Gibson Krystal Gibson                     MRN:          [id number]  DATE:07/22/2006                            DOB:          [birth year]    PRIMARY SERVICE/ACUTE OFFICE VISIT:   HISTORY:  A 55 year old black female with a history of documented  diverticulosis by most recent colonoscopy dated Sep 09, 2003, who was  seen by our nurse practitioner on March 5 with lower abdominal pain  associated with constipation.  It was felt that she probably had a  flareup of diverticulosis by diverticulitis, and it was recommended she  take Flagyl and Cipro for 7 days as well as initiate Citrucel.  She says  she is quite a bit better.  Denies any fever or change in bowel or  bladder habits.  However, she still has mild discomfort in her lower  abdomen.   The patient did not use Citrucel as recommended.   PHYSICAL EXAMINATION:  GENERAL:  She is a pleasant, ambulatory, black  female in no acute distress.  VITAL SIGNS:  She has normal vital signs.  HEENT:  Unremarkable.  Oropharynx clear.  LUNGS:  Lung fields clear to auscultation bilaterally.  HEART: Regular rate and rhythm without murmur, gallop, or rub.  ABDOMEN:  Minimal tenderness in the left lower quadrant.  No guarding or  rebound.  No evidence of abdominal mass.  EXTREMITIES:  Warm without calf tenderness, cyanosis, or clubbing.   Lab studies from her visit with our nurse practitioner on March 5  indicate a white count of only 11,000.  Sed rate 48.   IMPRESSION:  This most likely represents low-grade diverticulitis for  which she has already received appropriate treatment with Flagyl and  Cipro.  In the absence of fever or more convincing exam, I believe the  best thing to do is to take the Citrucel as previously recommended by  the nurse practitioner at 1 teaspoon b.i.d. and make a followup  appointment to see Dr. Russella Dar, her GI physician of  record.  In the  meantime, should her abdominal pain worsen or be associated with fever  or vomiting, she needs to either give [place] or Dr. Russella Dar a call or go  immediately to t he emergency room.     Charlaine Dalton. Sherene Sires, MD, [facility]  Electronically Signed    MBW/MedQ  DD: 07/22/2006  DT: 07/22/2006  Job #: [id number]   cc:   Venita Lick. Russella Dar, MD, [facility]

## 2010-09-08 NOTE — Procedures (Signed)
Krystal Gibson, Krystal Gibson NO.:  [id number]   MEDICAL RECORD NO.:  [id number]          PATIENT TYPE:  OUT   LOCATION:  SLEEP CENTER                 FACILITY:  [facility]   PHYSICIAN:  Marcelyn Bruins, M.D. [facility] DATE OF BIRTH:  12/19/1955   DATE OF STUDY:  07/06/2005                              NOCTURNAL POLYSOMNOGRAM   INDICATIONS:  Hypersomnia with sleep apnea.   Epworth score:  12.  Sleep architecture:  The patient total sleep time of  241 minutes with decreased slow wave sleep in REM.  Sleep onset latency was  prolonged at 62 minutes and REM onset was prolonged as well at 174 minutes.  Sleep efficiency was decreased at 53%.   RESPIRATORY DATA:  The patient was found to have 29 hypopneas and 18 apneas  for a respiratory disturbance index of 12 events per hour.  The events were  more common during REM.  There was moderate snoring noted throughout.   OXYGEN DATA:  The O2 desaturation was low as 90%.   CARDIAC DATA:  No clinically significant cardiac arrhythmias.  Movement/parasomnias:  The patient was found to have 37 leg jerks with less  than one per hour resulting in arousal or awakening.   IMPRESSION/RECOMMENDATIONS:  Mild obstructive sleep apnea/hypopnea syndrome  with a respiratory disturbance index of 12 events per hour and O2  desaturation as low as 90%.  Treatment for this degree of sleep apnea may  include weight loss alone if applicable, oral appliance, upper airway  surgery, and also CPAP.  Clinical correlation is suggested.           ______________________________  Marcelyn Bruins, M.D. [facility]  Diplomate, American Board of Sleep  Medicine     KC/MEDQ  D:  07/25/2005 08:40:46  T:  07/25/2005 17:12:27  Job:  [id number]

## 2010-09-08 NOTE — Op Note (Signed)
[facility]  Patient:    Krystal Gibson, Krystal Gibson                    MRN: [id number]. Date: 08/30/00 Attending:  Katy Fitch. Naaman Plummer., M.D.                           Operative Report  PREOPERATIVE DIAGNOSIS:  Locked right thumb thumb stenosing tenosynovitis at A-1 pulley.  POSTOPERATIVE DIAGNOSIS:  Locked right thumb thumb stenosing tenosynovitis at A-1 pulley.  OPERATION PERFORMED:  Release of right thumb A-1 pulley with recovery of full active range of motion at the right thumb interphalangeal joint.  SURGEON:  Katy Fitch. Sypher, Montez Hageman., M.D.  ASSISTANT:  Jonni Sanger, P.A.  ANESTHESIA:  0.25% Marcaine and 2% lidocaine metacarpal head level block, right thumb.  SUPERVISING ANESTHESIOLOGIST:  Cliffton Asters. Ivin Booty, M.D.  INDICATIONS FOR PROCEDURE:  The patient is a 55 year old woman who has had chronic stenosing tenosynovitis affecting her right thumb.  This has failed nonoperative measures including splinting and anti-inflammatory medication. Due to failure to respond to nonoperative management, she is brought to the operating room at this time for release of her right thumb A-1 pulley.  DESCRIPTION OF PROCEDURE:  [name] was brought to the operating room and placed in supine position on the operating table.  Following light sedation, the right arm was prepped with Betadine soap and solution and sterilely draped.  The arm was exsanguinated with an Esmarch bandage.  The arterial tourniquet was inflated to 220 mmHg.  0.25% Marcaine and 2% lidocaine were infiltrated at the metacarpal head level to obtain a digital block.  When anesthesia was satisfactory, a short transverse incision was fashioned in the proximal thumb flexion crease.  The subcutaneous tissues were carefully divided taking care to identify the radial proper digital nerve.  This was gently retracted.  The A-1 pulley was isolated and split with scalpel and scissors.  The  tendon was delivered and found to have a rather significant swelling proximal to the pulley.  Thereafter full range of motion of the IP joint was recovered.  The wound was then repaired with interrupted sutures of 5-0 nylon.  The wound was then dressed with Xeroflo, sterile gauze and Coban.  There were no apparent complications.   For aftercare, Ms. Dantuono was given prescription for Vicodin 5 mg tablets, 20, 1 p.o. q.4-6h. p.r.n. pain, no refills.  She will return to office for follow-up in seven days for suture removal. DD:  08/30/00 TD:  08/30/00 Job: [id number]

## 2010-09-08 NOTE — Assessment & Plan Note (Signed)
[facility]                             PULMONARY OFFICE NOTE   Krystal, Gibson Krystal Gibson                     MRN:          [id number]  DATE:06/26/2006                            DOB:          [birth year]    HISTORY OF PRESENT ILLNESS:  The patient is a 55 year old African  American female patient of Dr. Thurston Gibson who has a known history of  hypertension and hyperlipidemia.  Presents for an acute office visit.  The patient complains over the last 5 days that she has had intermittent  onset of lower abdominal pain increase on the left.  The patient felt  that she might be somewhat constipated with only small stools and took a  laxative which seemed to help somewhat.  However, this morning when  trying to have a bowel movement she had a severe episode of left lower  abdominal pain that was quite severe and feels still very tender along  that side today.  The patient did vomit one time this morning.  The  patient denies any bloody stools, hematemesis, fever, chest pain,  palpitations, shortness of breath, urinary symptoms.  The patient does  have a known history of diverticulosis with previous colonoscopy in May  of 2005 by Dr. Russella Gibson.  The patient also has been diagnosed with  irritable bowel syndrome in the past with intermittent episodes of  diarrhea.  The patient denies any recent travel or antibiotic use.   PAST MEDICAL HISTORY:  Reviewed.   CURRENT MEDICATIONS:  Reviewed.   PHYSICAL EXAMINATION:  The patient is a pleasant female in no acute  distress.  She is afebrile, blood pressure 124/88, O2 saturation is 98%  on room air.  Weight is at 170.  HEENT:  Sclerae nonicteric.  Posterior pharynx is clear.  NECK:  Supple without cervical adenopathy.  No JVD.  LUNGS:  Sounds are clear to auscultation bilaterally without any  wheezing or crackles.  CARDIAC:  S1, S2, without murmur, rub, or gallop.  ABDOMEN:  Soft with positive bowel sounds throughout all 4  quadrants.  The patient does have tenderness along the left lower quadrant.  No  guarding or rebound noted.  Negative CVA tenderness.  EXTREMITIES:  Warm without any calf tenderness, cyanosis, clubbing, or  edema.   IMPRESSION/PLAN:  1. Left lower abdominal pain, questionable underlying diverticulitis.      Have recommended patient undergo a CT abdomen and pelvis.  However,      patient has declined this informing me that she only has a very      Engineer, petroleum that will not cover this.  Have advised her      that if symptoms do not improve she will need to undergo this.  The      patient has been recommended to begin Flagyl times seven days along      with Cipro 750 b.i.d. times seven days.  She may use Levsin as      needed for abdominal cramping.  Advance a clear liquid diet as      tolerated.  Labs including CBC, sed rate, and  hepatic function      tests are pending.  The patient will return here in 1 week with Dr.      Sherene Gibson or sooner if needed.  2. Hyperlipidemia.  Fasting lipid panel is pending.  Will follow up in      clinic.  She will return in one week to review these labs.      Krystal Oaks, NP  Electronically Signed      Krystal Gibson. Krystal Sires, MD, [facility]  Electronically Signed   TP/MedQ  DD: 06/26/2006  DT: 06/26/2006  Job #: [id number]

## 2011-05-11 ENCOUNTER — Ambulatory Visit: Payer: Self-pay | Admitting: Family Medicine

## 2011-05-29 ENCOUNTER — Telehealth: Payer: Self-pay | Admitting: Family Medicine

## 2011-05-29 NOTE — Telephone Encounter (Signed)
Pt called, has been having "dizzy spells" when she gets up, and has a flash of light for a second. She is also experiencing hair loss. Has seen dr Audria Nine in the past.

## 2011-05-30 NOTE — Telephone Encounter (Signed)
SPOKE WITH PT AND ADVISED TO COME IN FOR EVAL. PT WANTED TO MAKE AN APPT SO I TRANSFERRED HER TO 104

## 2011-06-05 ENCOUNTER — Encounter: Payer: Self-pay | Admitting: Family Medicine

## 2011-06-05 DIAGNOSIS — F419 Anxiety disorder, unspecified: Secondary | ICD-10-CM | POA: Insufficient documentation

## 2011-06-06 ENCOUNTER — Encounter: Payer: Self-pay | Admitting: Family Medicine

## 2011-06-06 ENCOUNTER — Ambulatory Visit (INDEPENDENT_AMBULATORY_CARE_PROVIDER_SITE_OTHER): Payer: BC Managed Care – PPO | Admitting: Family Medicine

## 2011-06-06 DIAGNOSIS — M542 Cervicalgia: Secondary | ICD-10-CM

## 2011-06-06 DIAGNOSIS — R42 Dizziness and giddiness: Secondary | ICD-10-CM

## 2011-06-06 DIAGNOSIS — H8110 Benign paroxysmal vertigo, unspecified ear: Secondary | ICD-10-CM

## 2011-06-06 DIAGNOSIS — I10 Essential (primary) hypertension: Secondary | ICD-10-CM

## 2011-06-06 NOTE — Patient Instructions (Addendum)
You will be scheduled for MRI of cervical spine to look for an explanation for the vertigo.    Vertigo Vertigo means you feel like you or your surroundings are moving when they are not. Vertigo can be dangerous if it occurs when you are at work, driving, or performing difficult activities.  CAUSES  Vertigo occurs when there is a conflict of signals sent to your brain from the visual and sensory systems in your body. There are many different causes of vertigo, including:  Infections, especially in the inner ear.   A bad reaction to a drug or misuse of alcohol and medicines.   Withdrawal from drugs or alcohol.   Rapidly changing positions, such as lying down or rolling over in bed.   A migraine headache.   Decreased blood flow to the brain.   Increased pressure in the brain from a head injury, infection, tumor, or bleeding.  SYMPTOMS  You may feel as though the world is spinning around or you are falling to the ground. Because your balance is upset, vertigo can cause nausea and vomiting. You may have involuntary eye movements (nystagmus). DIAGNOSIS  Vertigo is usually diagnosed by physical exam. If the cause of your vertigo is unknown, your caregiver may perform imaging tests, such as an MRI scan (magnetic resonance imaging). TREATMENT  Most cases of vertigo resolve on their own, without treatment. Depending on the cause, your caregiver may prescribe certain medicines. If your vertigo is related to body position issues, your caregiver may recommend movements or procedures to correct the problem. In rare cases, if your vertigo is caused by certain inner ear problems, you may need surgery. HOME CARE INSTRUCTIONS   Follow your caregiver's instructions.   Avoid driving.   Avoid operating heavy machinery.   Avoid performing any tasks that would be dangerous to you or others during a vertigo episode.   Tell your caregiver if you notice that certain medicines seem to be causing your  vertigo. Some of the medicines used to treat vertigo episodes can actually make them worse in some people.  SEEK IMMEDIATE MEDICAL CARE IF:   Your medicines do not relieve your vertigo or are making it worse.   You develop problems with talking, walking, weakness, or using your arms, hands, or legs.   You develop severe headaches.   Your nausea or vomiting continues or gets worse.   You develop visual changes.   A family member notices behavioral changes.   Your condition gets worse.  MAKE SURE YOU:  Understand these instructions.   Will watch your condition.   Will get help right away if you are not doing well or get worse.  Document Released: 01/17/2005 Document Revised: 12/20/2010 Document Reviewed: 10/26/2010 [facility] Patient Information 2012 [place], [place].

## 2011-06-06 NOTE — Progress Notes (Signed)
  Subjective:    Patient ID: Krystal Gibson, female    DOB: May 17, 1955, 56 y.o.   MRN: [id number]  HPI   This 56 y.o. AA female with well- controlled HTN returns for follow-up. Today, she complains   of persistent dizziness and pain on left side of neck. Visual disturbances occur also with certain  head/ neck positions. Eye exanmrecently was unchanged from last year. Stopped smoking in October.  Pt. Can push on a spot on left side of neck and reproduce symptoms. Has a lot of popping in neck.  Review of Systems  Constitutional: Negative.   HENT: Positive for neck pain and neck stiffness. Negative for ear pain and tinnitus.   Eyes: Positive for visual disturbance.  Cardiovascular: Negative.   Musculoskeletal: Negative for gait problem.  Neurological: Negative for tremors, speech difficulty, weakness, numbness and headaches.  Psychiatric/Behavioral: Negative for confusion and sleep disturbance. The patient is nervous/anxious.        Objective:   Physical Exam  Nursing note and vitals reviewed. Constitutional: She appears well-developed and well-nourished. No distress.  HENT:  Head: Normocephalic and atraumatic.  Right Ear: External ear normal.  Left Ear: External ear normal.  Mouth/Throat: Oropharynx is clear and moist.  Eyes: EOM are normal. No scleral icterus.       Conjunctivae injected bilat.  Neck: Normal range of motion. No thyromegaly present.       Tender on posterior left side  Cardiovascular: Normal rate.   Pulmonary/Chest: Effort normal. No respiratory distress.  Lymphadenopathy:    She has no cervical adenopathy.  Skin: Skin is warm and dry.  Psychiatric: She has a normal mood and affect. Her behavior is normal.          Assessment & Plan:   1. Cervical pain (neck)  MR Cervical Spine Wo Contrast to r/o Cervical DDD Which may be impinging on Vertebral vessel; pt is reluctant to have procedure done with contrast as she said it made her sick last time  2.  Dizziness  This is mostly positional so I have advised her to limit driving and avoid rapid head-turning movements which  Bring on the dizziness   3.  Hypertension                                                      Stable and well- controlled on current medication

## 2011-06-07 ENCOUNTER — Other Ambulatory Visit: Payer: Self-pay | Admitting: Family Medicine

## 2011-06-07 ENCOUNTER — Telehealth: Payer: Self-pay

## 2011-06-07 ENCOUNTER — Encounter: Payer: Self-pay | Admitting: Family Medicine

## 2011-06-07 DIAGNOSIS — E7800 Pure hypercholesterolemia, unspecified: Secondary | ICD-10-CM

## 2011-06-07 NOTE — Telephone Encounter (Signed)
.  [facility] PT STATES SHE WAS TO HAVE A REFERRAL THAT WAS TO EXPENSIVE, HAVE ALREADY MADE AN APPT WITH [facility] TOMORROW NEED TO HAVE AN ORDER FAXED OVER TO THEM . PLEASE CALL PT AT [phone]

## 2011-06-07 NOTE — Telephone Encounter (Signed)
Called 104 and spoke with Dr Audria Nine to let her know I was forwarding message for her for referral order

## 2011-06-07 NOTE — Progress Notes (Signed)
Left message on machine for patient to call back.

## 2011-06-08 ENCOUNTER — Other Ambulatory Visit: Payer: Self-pay | Admitting: Family Medicine

## 2011-06-08 DIAGNOSIS — M542 Cervicalgia: Secondary | ICD-10-CM

## 2011-06-08 NOTE — Progress Notes (Signed)
Left message for patient to return call.

## 2011-06-08 NOTE — Progress Notes (Signed)
Patient notified and voiced understanding.

## 2011-06-11 ENCOUNTER — Encounter (INDEPENDENT_AMBULATORY_CARE_PROVIDER_SITE_OTHER): Payer: BC Managed Care – PPO | Admitting: Family Medicine

## 2011-06-11 ENCOUNTER — Other Ambulatory Visit: Payer: Self-pay | Admitting: Family Medicine

## 2011-06-11 ENCOUNTER — Other Ambulatory Visit: Payer: Self-pay | Admitting: *Deleted

## 2011-06-11 VITALS — BP 135/81 | HR 64 | Temp 98.1°F | Resp 16 | Ht 65.5 in | Wt 180.0 lb

## 2011-06-11 DIAGNOSIS — R42 Dizziness and giddiness: Secondary | ICD-10-CM

## 2011-06-11 DIAGNOSIS — E7800 Pure hypercholesterolemia, unspecified: Secondary | ICD-10-CM

## 2011-06-11 DIAGNOSIS — E785 Hyperlipidemia, unspecified: Secondary | ICD-10-CM

## 2011-06-11 LAB — LIPID PANEL
Cholesterol: 305 mg/dL — ABNORMAL HIGH (ref 0–200)
Total CHOL/HDL Ratio: 7.3 Ratio
VLDL: 21 mg/dL (ref 0–40)

## 2011-06-11 LAB — ALT: ALT: 23 U/L (ref 0–35)

## 2011-06-11 NOTE — Progress Notes (Signed)
  Subjective:    Patient ID: Krystal Gibson, female    DOB: [birth year], 56 y.o.   MRN: [id number]  HPI  Review of Systems     Objective:   Physical Exam        Assessment & Plan:  Patient apparently came in and got her blood drawn today. This encounter was created in error - please disregard.

## 2011-06-13 ENCOUNTER — Telehealth: Payer: Self-pay

## 2011-06-13 NOTE — Telephone Encounter (Signed)
Message copied by Jerelene Redden on Wed Jun 13, 2011  1:10 PM ------      Message from: Malvin Johns      Created: Thu Jun 07, 2011  2:01 PM       Hi Britta Mccreedy                We need to call [facility] at [phone] to give additional clinical info for this pt  Thanks!

## 2011-06-13 NOTE — Telephone Encounter (Signed)
Called [facility] to make sure pt was approved and did get MRI done there on 06/08/11. Was told that she did and they will fax report to [place].

## 2011-06-19 ENCOUNTER — Encounter: Payer: Self-pay | Admitting: Family Medicine

## 2011-06-19 ENCOUNTER — Emergency Department (HOSPITAL_COMMUNITY): Payer: BC Managed Care – PPO

## 2011-06-19 ENCOUNTER — Ambulatory Visit (INDEPENDENT_AMBULATORY_CARE_PROVIDER_SITE_OTHER): Payer: BC Managed Care – PPO | Admitting: Family Medicine

## 2011-06-19 ENCOUNTER — Encounter (HOSPITAL_COMMUNITY): Payer: Self-pay | Admitting: *Deleted

## 2011-06-19 ENCOUNTER — Emergency Department (HOSPITAL_COMMUNITY)
Admission: EM | Admit: 2011-06-19 | Discharge: 2011-06-19 | Disposition: A | Discharge status: Home / Self Care | Payer: BC Managed Care – PPO | Attending: Emergency Medicine | Admitting: Emergency Medicine

## 2011-06-19 DIAGNOSIS — K5730 Diverticulosis of large intestine without perforation or abscess without bleeding: Secondary | ICD-10-CM | POA: Insufficient documentation

## 2011-06-19 DIAGNOSIS — I10 Essential (primary) hypertension: Secondary | ICD-10-CM | POA: Insufficient documentation

## 2011-06-19 DIAGNOSIS — R42 Dizziness and giddiness: Secondary | ICD-10-CM

## 2011-06-19 DIAGNOSIS — R112 Nausea with vomiting, unspecified: Secondary | ICD-10-CM | POA: Insufficient documentation

## 2011-06-19 DIAGNOSIS — F341 Dysthymic disorder: Secondary | ICD-10-CM | POA: Insufficient documentation

## 2011-06-19 DIAGNOSIS — R1084 Generalized abdominal pain: Secondary | ICD-10-CM

## 2011-06-19 DIAGNOSIS — K5790 Diverticulosis of intestine, part unspecified, without perforation or abscess without bleeding: Secondary | ICD-10-CM

## 2011-06-19 DIAGNOSIS — Z79899 Other long term (current) drug therapy: Secondary | ICD-10-CM | POA: Insufficient documentation

## 2011-06-19 DIAGNOSIS — M542 Cervicalgia: Secondary | ICD-10-CM

## 2011-06-19 LAB — URINALYSIS, ROUTINE W REFLEX MICROSCOPIC
Bilirubin Urine: NEGATIVE
Hgb urine dipstick: NEGATIVE
Nitrite: NEGATIVE
Protein, ur: NEGATIVE mg/dL
Urobilinogen, UA: 1 mg/dL (ref 0.0–1.0)

## 2011-06-19 LAB — COMPREHENSIVE METABOLIC PANEL
Albumin: 4.2 g/dL (ref 3.5–5.2)
BUN: 18 mg/dL (ref 6–23)
Calcium: 10.1 mg/dL (ref 8.4–10.5)
Chloride: 101 mEq/L (ref 96–112)
Creatinine, Ser: 0.72 mg/dL (ref 0.50–1.10)
Total Bilirubin: 0.6 mg/dL (ref 0.3–1.2)

## 2011-06-19 LAB — DIFFERENTIAL
Basophils Relative: 0 % (ref 0–1)
Eosinophils Absolute: 0 10*3/uL (ref 0.0–0.7)
Eosinophils Relative: 0 % (ref 0–5)
Monocytes Absolute: 0.8 10*3/uL (ref 0.1–1.0)
Monocytes Relative: 6 % (ref 3–12)
Neutro Abs: 11.1 10*3/uL — ABNORMAL HIGH (ref 1.7–7.7)

## 2011-06-19 LAB — CBC
HCT: 39.5 % (ref 36.0–46.0)
Hemoglobin: 14.1 g/dL (ref 12.0–15.0)
MCH: 30.8 pg (ref 26.0–34.0)
MCHC: 35.7 g/dL (ref 30.0–36.0)
MCV: 86.2 fL (ref 78.0–100.0)
RDW: 14 % (ref 11.5–15.5)

## 2011-06-19 LAB — PROCALCITONIN: Procalcitonin: <0.1 ng/mL

## 2011-06-19 LAB — LACTIC ACID, PLASMA: Lactic Acid, Venous: 1.4 mmol/L (ref 0.5–2.2)

## 2011-06-19 LAB — LIPASE, BLOOD: Lipase: 17 U/L (ref 11–59)

## 2011-06-19 MED ORDER — IOHEXOL 300 MG/ML  SOLN
100.0000 mL | Freq: Once | INTRAMUSCULAR | Status: AC | PRN
  Administered 2011-06-19: 100 mL via INTRAVENOUS

## 2011-06-19 MED ORDER — HYDROMORPHONE HCL PF 1 MG/ML IJ SOLN
1.0000 mg | Freq: Once | INTRAMUSCULAR | Status: AC
  Administered 2011-06-19: 1 mg via INTRAVENOUS
  Filled 2011-06-19: qty 1

## 2011-06-19 MED ORDER — ONDANSETRON 8 MG PO TBDP: 8.0000 mg | ORAL_TABLET | Freq: Three times a day (TID) | ORAL | Status: AC | PRN

## 2011-06-19 MED ORDER — MORPHINE SULFATE 2 MG/ML IJ SOLN
2.0000 mg | INTRAMUSCULAR | Status: DC | PRN
  Administered 2011-06-19: 2 mg via INTRAVENOUS
  Filled 2011-06-19: qty 1

## 2011-06-19 MED ORDER — ONDANSETRON HCL 4 MG/2ML IJ SOLN
4.0000 mg | INTRAMUSCULAR | Status: DC | PRN
  Administered 2011-06-19: 4 mg via INTRAVENOUS
  Filled 2011-06-19: qty 2

## 2011-06-19 MED ORDER — HYDROCODONE-ACETAMINOPHEN 5-325 MG PO TABS: 2.0000 | ORAL_TABLET | ORAL | Status: AC | PRN

## 2011-06-19 MED ORDER — PROMETHAZINE HCL 25 MG PO TABS: 25.0000 mg | ORAL_TABLET | Freq: Three times a day (TID) | ORAL | Status: DC | PRN

## 2011-06-19 MED ORDER — SODIUM CHLORIDE 0.9 % IV SOLN
INTRAVENOUS | Status: DC
  Administered 2011-06-19: 20:00:00 via INTRAVENOUS

## 2011-06-19 NOTE — ED Provider Notes (Signed)
55yo F, c/o gradual onset and persistence of constant RLQ abd "pain" since yesterday.  Has been assoc with N/V/D and poor PO intake since yesterday.  Describes the diarrhea as "watery."  Denies fevers, no black or blood in stools or emesis, no vaginal bleeding/discharge, no dysuria, no flank pain, no rash.  Has hx of similar pain approx 1 month, dx diverticulitis, tx with abx with resolution of symptoms.  Also endorses several month hx of "dizziness" that she has been eval by her PMD for and "is supposed to be going to a Neurologist soon."  States she is here in the ED today because her PMD wanted her to be eval for her abd pain.  VSS/afebrile, A&O, neuro non-focal, CTA, RRR, +RLQ TTP, no CVAT bilat.  Labs, UA/UC, CT A/P, IVF, pain meds/antiemetics ordered.   I saw this patient for screening purposes.  Pt is stable, but further workup and evaluation is needed beyond triage capabilities. Shakiara Lukic Allison Quarry, DO 06/19/11 2128

## 2011-06-19 NOTE — Progress Notes (Signed)
  Subjective:    Patient ID: Krystal Gibson, female    DOB: [birth year], 56 y.o.   MRN: [id number]  HPI   The pt presents with onset of abd pain last PM; she thinks it may be a flare-up of diverticulitis. Stools were "messy" then changed to diarrhea lats night.  Had nausea and vomiting but no fever. Pain is "9" out of 10. She continues to have dizziness. Today, she is not able to tolerate even liquids. She says she did not sleep last night and was in tears most of the night.  The pt had and MRI done at [facility]; it shows a bulging disc in the C-spine. She needs a referral to  a Specialist.  Review of Systems as per HPI     Objective:   Physical Exam  Constitutional: She is oriented to person, place, and time. She appears well-developed. She appears distressed.  HENT:  Head: Normocephalic and atraumatic.  Abdominal: Soft. She exhibits no distension and no mass. There is tenderness. There is rebound.       Tenderness is most noted in RLQ.  Genitourinary: Guaiac negative stool.       Rectal exam: hemorrhoids; no masses.  Neurological: She is alert and oriented to person, place, and time.  Skin: Skin is warm and dry.  Psychiatric: She has a normal mood and affect. Her behavior is normal. Judgment normal.     Results for orders placed in visit on 06/19/11  IFOBT (OCCULT BLOOD)      Component Value Range   IFOBT Negative           Assessment & Plan:   1. Abdominal pain - hx of Diverticular disease  IFOBT POC (occult bld, rslt in office), After further conversation with pt, it was decided that she needs further evaluation at [facility] ED. She agrees and will be transported there by her son by private vehicle.       2. Dizziness  Abnormal MRI- refer pt to Neurosurgeon

## 2011-06-19 NOTE — Discharge Instructions (Signed)
Follow up with your primary care physician above for further evaluation of your abdominal pain. Only use your pain medication for severe pain. Do not operate heavy machinery while on pain medication or muscle relaxer. Note that your pain medication contains acetaminophen (Tylenol) & its is not recommended that you use additional acetaminophen (Tylenol) while taking this medication.   Abdominal Pain  Your exam might not show the exact reason you have abdominal pain. Since there are many different causes of abdominal pain, another checkup and more tests may be needed. It is very important to follow up for lasting (persistent) or worsening symptoms. A possible cause of abdominal pain in any person who still has his or her appendix is acute appendicitis. Appendicitis is often hard to diagnose. Normal blood tests, urine tests, ultrasound, and CT scans do not completely rule out early appendicitis or other causes of abdominal pain. Sometimes, only the changes that happen over time will allow appendicitis and other causes of abdominal pain to be determined. Other potential problems that may require surgery may also take time to become more apparent. Because of this, it is important that you follow all of the instructions below.   HOME CARE INSTRUCTIONS  Do not take laxatives unless directed by your caregiver. Rest as much as possible.  Do not eat solid food until your pain is gone: A diet of water, weak decaffeinated tea, broth or bouillon, gelatin, oral rehydration solutions (ORS), frozen ice pops, or ice chips may be helpful.  When pain is gone: Start a light diet (dry toast, crackers, applesauce, or white rice). Increase the diet slowly as long as it does not bother you. Eat no dairy products (including cheese and eggs) and no spicy, fatty, fried, or high-fiber foods.  Use no alcohol, caffeine, or cigarettes.  Take your regular medicines unless your caregiver told you not to.  Take any prescribed medicine as  directed.   SEEK IMMEDIATE MEDICAL CARE IF:  The pain does not go away.  You have a fever >101 that persists You keep throwing up (vomiting) or cannot drink liquids.  The pain becomes localized (Pain in the right side could possibly be appendicitis. In an adult, pain in the left lower portion of the abdomen could be colitis or diverticulitis). You pass bloody or black tarry stools.  You have shaking chills.  There is blood in your vomit or you see blood in your bowel movements.  Your bowel movements stop (become blocked) or you cannot pass gas.  You have bloody, frequent, or painful urination.  You have yellow discoloration in the skin or whites of the eyes.  Your stomach becomes bloated or bigger.  You have dizziness or fainting.  You have chest or back pain.

## 2011-06-19 NOTE — ED Notes (Signed)
Patient reports onset of right sided abd pain since yesterday.  Patient states she has had n/v and has not been able to eat.  Patient has been evaluated as well for the dizziness with ct scan.  Patient had an ekg on 02-15

## 2011-06-19 NOTE — ED Provider Notes (Signed)
History     CSN: [id number]  Arrival date & time 06/19/11  1620   First MD Initiated Contact with Patient 06/19/11 1810      Chief Complaint  Patient presents with  . Abdominal Pain  . Emesis  . Dizziness    (Consider location/radiation/quality/duration/timing/severity/associated sxs/prior treatment) HPI Comments: Patient reports that yesterday she began having RLQ and LLQ abdominal pain.  She reports that the pain is constant and is unchanged from onset.  She reports that she also had approximately seven episodes of vomiting today.  Denies hematemesis.  She reports that she had a couple of episodes of diarrhea yesterday.  She reports that she has has similar pain in the past when she was diagnosed with Diverticulitis.  Past surgical history of Partial Hysterectomy and c-section.    Patient is a 56 y.o. female presenting with abdominal pain and vomiting. The history is provided by the patient.  Abdominal Pain The primary symptoms of the illness include abdominal pain, nausea, vomiting and diarrhea. The primary symptoms of the illness do not include fever, shortness of breath, hematemesis, hematochezia, dysuria, vaginal discharge or vaginal bleeding. The current episode started yesterday. The problem has not changed since onset. The patient states that she believes she is currently not pregnant. The patient has not had a change in bowel habit. Symptoms associated with the illness do not include chills, diaphoresis, constipation, hematuria or frequency. Significant associated medical issues include diverticulitis.  Emesis  Associated symptoms include abdominal pain and diarrhea. Pertinent negatives include no chills and no fever.    Past Medical History  Diagnosis Date  . Hypertension   . Depression   . Anxiety   . Diverticulosis     Past Surgical History  Procedure Date  . Abdominal hysterectomy   . Breast enhancement surgery   . Shoulder surgery   . Orthopedic surgery      No family history on file.  History  Substance Use Topics  . Smoking status: Former Smoker -- 0.5 packs/day    Types: Cigarettes    Quit date: 02/22/2011  . Smokeless tobacco: Not on file  . Alcohol Use: No     stopped 1990    OB History    Grav Para Term Preterm Abortions TAB SAB Ect Mult Living                  Review of Systems  Constitutional: Negative for fever, chills and diaphoresis.  HENT: Negative for neck pain and neck stiffness.   Respiratory: Negative for shortness of breath.   Gastrointestinal: Positive for nausea, vomiting, abdominal pain and diarrhea. Negative for constipation, blood in stool, hematochezia and hematemesis.  Genitourinary: Negative for dysuria, frequency, hematuria, flank pain, vaginal bleeding, vaginal discharge, vaginal pain and pelvic pain.  Skin: Negative for rash.  Neurological: Negative for dizziness, syncope and light-headedness.    Allergies  Sulfa antibiotics  Home Medications   Current Outpatient Rx  Name Route Sig Dispense Refill  . ALPRAZOLAM 0.25 MG PO TABS Oral Take 0.125 mg by mouth at bedtime as needed. For sleep    . ASPIRIN 325 MG PO TBEC Oral Take 325 mg by mouth daily.    Marland Kitchen VITAMIN D 1000 UNITS PO TABS Oral Take 1,000 Units by mouth daily.    Marland Kitchen CITALOPRAM HYDROBROMIDE 20 MG PO TABS  take 1 tablet by mouth once daily for anxiety 30 tablet 0  . LISINOPRIL-HYDROCHLOROTHIAZIDE 10-12.5 MG PO TABS Oral Take 1 tablet by mouth daily.    Marland Kitchen  NICOTINE POLACRILEX 4 MG MT GUM Oral Take 4 mg by mouth as needed. For nicotine habit    . PROPYLENE GLYCOL 0.6 % OP SOLN Both Eyes Place 1 drop into both eyes daily.     Marland Kitchen HYDROCODONE-ACETAMINOPHEN 5-325 MG PO TABS Oral Take 2 tablets by mouth every 4 (four) hours as needed for pain. 10 tablet 0  . ONDANSETRON 8 MG PO TBDP Oral Take 1 tablet (8 mg total) by mouth every 8 (eight) hours as needed for nausea. 20 tablet 0    BP 133/70  Pulse 67  Temp(Src) 99.3 F (37.4 C) (Oral)  Resp 20   Ht 5' 5.5" (1.664 m)  Wt 178 lb (80.74 kg)  BMI 29.17 kg/m2  SpO2 100%  Physical Exam  Nursing note and vitals reviewed. Constitutional: She is oriented to person, place, and time. She appears well-developed and well-nourished. No distress.  HENT:  Head: Normocephalic and atraumatic.  Neck: Normal range of motion. Neck supple.  Cardiovascular: Normal rate, regular rhythm and normal heart sounds.   Pulmonary/Chest: Effort normal and breath sounds normal.  Abdominal: Soft. Normal appearance and bowel sounds are normal. She exhibits no mass. There is tenderness in the right lower quadrant and left lower quadrant. There is tenderness at McBurney's point. There is no rigidity, no rebound, no guarding and no CVA tenderness.  Musculoskeletal: Normal range of motion.  Neurological: She is alert and oriented to person, place, and time.  Skin: Skin is warm and dry. No rash noted. She is not diaphoretic. No erythema.  Psychiatric: She has a normal mood and affect.    ED Course  Procedures (including critical care time)  Labs Reviewed  CBC - Abnormal; Notable for the following:    WBC 14.4 (*)    All other components within normal limits  DIFFERENTIAL - Abnormal; Notable for the following:    Neutro Abs 11.1 (*)    All other components within normal limits  COMPREHENSIVE METABOLIC PANEL - Abnormal; Notable for the following:    Glucose, Bld 106 (*)    All other components within normal limits  LIPASE, BLOOD  LACTIC ACID, PLASMA  PROCALCITONIN  URINALYSIS, ROUTINE W REFLEX MICROSCOPIC  URINE CULTURE   Ct Abdomen Pelvis W Contrast  06/19/2011  *RADIOLOGY REPORT*  Clinical Data: The right lower quadrant pain.  Nausea vomiting.  A diverticulitis.  Anorexia.  CT ABDOMEN AND PELVIS WITH CONTRAST  Technique:  Multidetector CT imaging of the abdomen and pelvis was performed following the standard protocol during bolus administration of intravenous contrast.  Contrast:  100 ml Omnipaque-300 and  oral contrast  Comparison: 04/30/2010  Findings: Small hiatal hernia is again seen.  The liver, gallbladder, spleen, pancreas, and adrenal glands are normal in appearance.  Eight tiny left renal cyst is stable and kidneys otherwise normal in appearance.  No evidence of hydronephrosis.  No soft tissue masses or lymphadenopathy identified within the abdomen or pelvis.  Prior hysterectomy again noted.  Adnexa are unremarkable in appearance.  A severe diverticulosis is again seen involving the majority the colon, however there is no evidence of diverticulitis.  No other inflammatory process or abnormal fluid collections are identified within the abdomen or pelvis.  No evidence of bowel wall thickening or dilatation.  IMPRESSION:  1.  Diverticulosis.  No evidence of diverticulitis or other acute findings. 2.  Stable small hiatal hernia.  Original Report Authenticated By: Danae Orleans, M.D.   Dg Abd Acute W/chest  06/19/2011  *RADIOLOGY REPORT*  Clinical Data: Left lower quadrant pain.  Nausea and vomiting.  ACUTE ABDOMEN SERIES (ABDOMEN 2 VIEW & CHEST 1 VIEW)  Comparison: Abdominal CT 04/30/2010 and chest radiograph 11/26/2006  Findings: The lungs are clear. Heart and mediastinum are within normal limits.  The trachea is midline.  No evidence for free air. There is a small retrocardiac density which probably represents a hiatal hernia.  Nonspecific bowel gas pattern.  There is gas and stool in the colon.  No significant small bowel dilatation. Multiple calcifications in the pelvis are suggestive for phleboliths.  No gross bony abnormality.  IMPRESSION: No acute chest findings.  Evidence for a small hiatal hernia.  Nonspecific bowel gas pattern.  Original Report Authenticated By: Richarda Overlie, M.D.     1. Diverticulosis   2. Abdominal pain     12:13 AM Reassessed patient.  She reports that her pain and nausea are controlled at this time.  Patient able to tolerate po liquids.  MDM  Patient comes in today with  abdominal pain.  Patient afebrile.  Pain and vomiting controlled while in the ED.  Patient able to tolerate po liquids.  CT abdomen does not show any evidence of diverticulitis or other acute findings.  Patient has PCP follow up.  Patient instructed to follow up with her PCP and instructed to return to ED if pain changes or becomes worse.        Pascal Lux [place], PA-C 06/20/11 [phone]

## 2011-06-19 NOTE — ED Notes (Signed)
Pt st's she has had pain like this before and was told she had diverticulitis.  St's on of pain in RLQ yesterday with nausea and vomiting/

## 2011-06-19 NOTE — ED Notes (Signed)
Pt c/o lower abd pain, increase pain to RLQ region, dizziness for chronic vertigo, and N/V/D starting 1600 yesterday, vomit x7 today, pain in back and neck when vomiting, hx of diverticulosis. Denies any blood in urine or stool or burning w/urination, pt reports several episodes of loose stool yesterday am,

## 2011-06-19 NOTE — ED Notes (Signed)
Rounded on patient.  Patient in waiting room, reporting pain.  Updated on status, patient is next to be seen.  Offered warm blanket.

## 2011-06-20 LAB — URINE CULTURE

## 2011-06-20 NOTE — Progress Notes (Signed)
MR-Cervical Spine w/o contrast: Report from [facility]-  Multlevel osseous and disc changes                                                                                                                                      At C2-3: small central disc protrusion mildly impresses the ventral cord                                                                                                                                      At C5-6: spondylosis and shallow left paramidline disc protrusion result in mild canal narrowing with subtle cord impingement                                                                                                                                                    and moderate bilateral foraminal stenosis                                                                                                                                      Unusual T1 hyperintense signal  projects in the midline occipital lobe, included on sagittal images only (likely artifact).                                                                                                                                                    Brain MRI recommended for further assessment.

## 2011-06-21 ENCOUNTER — Ambulatory Visit: Payer: BC Managed Care – PPO | Admitting: Family Medicine

## 2011-06-21 NOTE — ED Provider Notes (Signed)
Medical screening examination/treatment/procedure(s) were performed by non-physician practitioner and as supervising physician I was immediately available for consultation/collaboration.   Celene Kras, MD 06/21/11 1011

## 2011-06-22 ENCOUNTER — Encounter: Payer: Self-pay | Admitting: Family Medicine

## 2011-06-22 ENCOUNTER — Ambulatory Visit (INDEPENDENT_AMBULATORY_CARE_PROVIDER_SITE_OTHER): Payer: BC Managed Care – PPO | Admitting: Family Medicine

## 2011-06-22 VITALS — BP 112/73 | HR 73 | Temp 97.4°F | Resp 16 | Ht 65.0 in | Wt 174.4 lb

## 2011-06-22 DIAGNOSIS — E785 Hyperlipidemia, unspecified: Secondary | ICD-10-CM

## 2011-06-22 DIAGNOSIS — M159 Polyosteoarthritis, unspecified: Secondary | ICD-10-CM

## 2011-06-22 DIAGNOSIS — M5030 Other cervical disc degeneration, unspecified cervical region: Secondary | ICD-10-CM

## 2011-06-22 DIAGNOSIS — R42 Dizziness and giddiness: Secondary | ICD-10-CM

## 2011-06-22 MED ORDER — PRAVASTATIN SODIUM 40 MG PO TABS: 40.0000 mg | ORAL_TABLET | Freq: Every day | ORAL | Status: DC

## 2011-07-02 ENCOUNTER — Other Ambulatory Visit: Payer: Self-pay | Admitting: Family Medicine

## 2011-07-02 ENCOUNTER — Telehealth: Payer: Self-pay

## 2011-07-02 NOTE — Telephone Encounter (Signed)
.  [facility] PT STATES SHE HASN'T HAD HER BLOOD PRESSURE MEDICINE FOR 5 DAYS NOW. HER [facility] IS SAYING THEY HAVE BEEN FAXING THE REQUEST OVER AND HASN'T HEARD FROM ANYONE. PLEASE CALL [phone]

## 2011-07-04 NOTE — Telephone Encounter (Signed)
Per [facility] request came in for lisinopril/hctz and we refilled this on 07/02/2011 to [facility] [employer].  Nothing received from [facility].  [facility] notifying patient that refill was available at local pharmacy.

## 2011-07-09 NOTE — Progress Notes (Signed)
  Subjective:    Patient ID: Krystal Gibson, female    DOB: 06/14/1955, 56 y.o.   MRN: [id number]  HPI  This pt returns to office for f/u after evaluation in ED for abdominal pain; diagnosed with Diverticulitis.  She is much better and trying to improve nutrition and avoid offending foods. Chronic neck pain  continues; she had MRI done on 06/08/11 and wants to discuss results.    Review of Systems  Constitutional: Positive for appetite change. Negative for fever and chills.  Gastrointestinal: Positive for diarrhea. Negative for nausea, vomiting, abdominal pain and constipation.  Musculoskeletal:       Neck pain  Neurological: Positive for dizziness.        Objective:   Physical Exam  Vitals reviewed. Constitutional: She is oriented to person, place, and time. She appears well-developed and well-nourished. No distress.  HENT:  Head: Normocephalic and atraumatic.  Eyes: Conjunctivae and EOM are normal. No scleral icterus.  Neck:       Neck- posterior pain with tenderness and decreased ROM  Abdominal: Soft. Bowel sounds are normal. She exhibits no mass. There is no tenderness. There is no rebound.  Neurological: She is alert and oriented to person, place, and time. No cranial nerve deficit.    MRI results- see scanned report      Assessment & Plan:   1. DDD (degenerative disc disease), cervical  Ambulatory referral to Physical Therapy       Continue current medications and other symptomatic treatments suggested

## 2011-07-12 ENCOUNTER — Encounter: Payer: Self-pay | Admitting: Family Medicine

## 2011-07-18 ENCOUNTER — Telehealth: Payer: Self-pay

## 2011-07-18 ENCOUNTER — Other Ambulatory Visit: Payer: Self-pay | Admitting: Physician Assistant

## 2011-07-18 ENCOUNTER — Other Ambulatory Visit: Payer: Self-pay

## 2011-07-18 MED ORDER — LISINOPRIL-HYDROCHLOROTHIAZIDE 10-12.5 MG PO TABS: 1.0000 | ORAL_TABLET | Freq: Every day | ORAL | Status: DC

## 2011-07-18 NOTE — Telephone Encounter (Signed)
PT WANTS YOU TO KNOW THAT SHE WENT TO THERAPY 2X TIMES AND STOPPED GOING AFTER THAT B/C SHE DID NOT SEE ANY CHANGE WITH POPPING, BUT IT DID HELP WITH THE TENSION. ALSO SHE WANTS TO KNOW IF SHE SHOULD GET LABS FOR CHOLESTROL WHEN SHE FINISHES 30 DAYS OF NEW MEDS. OR SHOULD SHE JUST CALL FOR A REFILL

## 2011-07-23 NOTE — Telephone Encounter (Signed)
Pt has RFs on the cholesterol medication; it looks like I have ordered Lipid panel and ALT .  The RF is at her pharmacy.

## 2011-07-24 ENCOUNTER — Other Ambulatory Visit (HOSPITAL_COMMUNITY): Payer: BC Managed Care – PPO

## 2011-07-24 NOTE — Telephone Encounter (Signed)
Pt is checking on status of a refill of her medication for cholesteral

## 2011-07-24 NOTE — Telephone Encounter (Signed)
Advised pt that she should have RFs on her pravastatin at pharmacy, and that Dr Audria Nine has ordered labs to be done when pt has finished 30 days of new med. Pt agreed.

## 2011-08-27 ENCOUNTER — Other Ambulatory Visit: Payer: Self-pay | Admitting: Physician Assistant

## 2011-08-28 ENCOUNTER — Other Ambulatory Visit: Payer: Self-pay

## 2011-10-16 ENCOUNTER — Other Ambulatory Visit: Payer: Self-pay | Admitting: Physician Assistant

## 2011-10-16 ENCOUNTER — Other Ambulatory Visit: Payer: Self-pay | Admitting: *Deleted

## 2011-12-02 ENCOUNTER — Other Ambulatory Visit: Payer: Self-pay | Admitting: Physician Assistant

## 2011-12-03 ENCOUNTER — Other Ambulatory Visit: Payer: Self-pay | Admitting: Physician Assistant

## 2011-12-03 ENCOUNTER — Other Ambulatory Visit: Payer: Self-pay | Admitting: Family Medicine

## 2011-12-03 NOTE — Telephone Encounter (Signed)
Ok x 1, needs OV?

## 2012-01-08 ENCOUNTER — Encounter: Payer: Self-pay | Admitting: Family Medicine

## 2012-01-08 ENCOUNTER — Ambulatory Visit (INDEPENDENT_AMBULATORY_CARE_PROVIDER_SITE_OTHER): Payer: BC Managed Care – PPO | Admitting: Family Medicine

## 2012-01-08 VITALS — BP 128/94 | HR 73 | Temp 98.3°F | Resp 16 | Ht 65.5 in | Wt 182.4 lb

## 2012-01-08 DIAGNOSIS — F32A Depression, unspecified: Secondary | ICD-10-CM | POA: Insufficient documentation

## 2012-01-08 DIAGNOSIS — I10 Essential (primary) hypertension: Secondary | ICD-10-CM

## 2012-01-08 DIAGNOSIS — M25512 Pain in left shoulder: Secondary | ICD-10-CM

## 2012-01-08 DIAGNOSIS — F329 Major depressive disorder, single episode, unspecified: Secondary | ICD-10-CM

## 2012-01-08 DIAGNOSIS — M5030 Other cervical disc degeneration, unspecified cervical region: Secondary | ICD-10-CM

## 2012-01-08 DIAGNOSIS — E785 Hyperlipidemia, unspecified: Secondary | ICD-10-CM

## 2012-01-08 LAB — TSH: TSH: 1.255 u[IU]/mL (ref 0.350–4.500)

## 2012-01-08 LAB — COMPREHENSIVE METABOLIC PANEL
CO2: 27 mEq/L (ref 19–32)
Creat: 0.67 mg/dL (ref 0.50–1.10)
Glucose, Bld: 90 mg/dL (ref 70–99)
Total Bilirubin: 0.9 mg/dL (ref 0.3–1.2)

## 2012-01-08 LAB — LIPID PANEL
HDL: 41 mg/dL (ref >39)
Triglycerides: 176 mg/dL — ABNORMAL HIGH (ref <150)

## 2012-01-08 MED ORDER — CITALOPRAM HYDROBROMIDE 40 MG PO TABS: 40.0000 mg | ORAL_TABLET | Freq: Every day | ORAL | Status: DC

## 2012-01-08 MED ORDER — PRAVASTATIN SODIUM 40 MG PO TABS: 40.0000 mg | ORAL_TABLET | Freq: Every day | ORAL | Status: DC

## 2012-01-08 MED ORDER — LISINOPRIL-HYDROCHLOROTHIAZIDE 10-12.5 MG PO TABS: 1.0000 | ORAL_TABLET | Freq: Every day | ORAL | Status: DC

## 2012-01-08 MED ORDER — ALPRAZOLAM 0.5 MG PO TBDP: ORAL_TABLET | ORAL | Status: DC

## 2012-01-08 NOTE — Patient Instructions (Addendum)
I am referring you to an Orthopedist for further evaluation of shoulder pain and right arm weakness which I think may be related to cervical spine disc disease. You have already been through physical therapy.

## 2012-01-08 NOTE — Progress Notes (Signed)
S:  Pt here for follow-up re: anxiety and depression; having crying spells because of familial stress of dealing with mother (chronically ill) and little help from siblings. Not sleeping well even with use of Alprazolam 0.25 mg at bedtime. 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 She has spoken with her supervisor about this situation. Has spoken with younger sister to enlist her help with mother.  Re: arm pain- left arm locks with certain extremes of ROM; it seems to lock up. Cannot lay on left side at night and cannot do any heavy lifting.  She is taking all meds as prescribed; needs labs today and med refills.  ROS: Pt is not suicidal. Arm pain not associated with numbess , paralysis or tremor.  O: Filed Vitals:   01/08/12 1302  BP: 128/94  Pulse: 73  Temp: 98.3 F (36.8 C)  Resp: 16   GEN: In NAD; WN,WD. HENT: [place]/AT. EOMI. NECK: Good ROM but increased discomfort with rotation to left. Tender point posterior L. MS: ROM at shoulders- limited on L with internal rotation and abduction above 90 degrees. Empty can test +/-. NEURO: A/O x 3; CNs intact. Motor Upper ext- 4/5 . DTRs 2+/=.Grip is good.                Mentation: somewhat tearful, flat affect.   A/P: 1. HTN (hypertension)  Comprehensive metabolic panel RF: Lisinopril- HCTZ 10-12.5 mg 1 tab daily  2. Hyperlipidemia  Lipid panel RF: Pravastatin 40 mg  1 tab every PM  3. Depression  TSH Increase Citalopram to 40 mg daily Increase Alprazolam to 0.5 mg  1/2 to 1 tab hs prn.  4. DDD (degenerative disc disease), cervical  Ambulatory referral to Orthopedic Surgery  5. Left shoulder pain  Ambulatory referral to Orthopedic Surgery

## 2012-01-10 ENCOUNTER — Encounter: Payer: Self-pay | Admitting: Radiology

## 2012-01-10 NOTE — Progress Notes (Signed)
Quick Note:  Please call pt and advise that the following labs are abnormal... Lipids are still very elevated on Pravastatin 40 mg. Your dose needs to be increased to 80 mg. You can take 2 -40 mg tablets until you run out . Please call the clinic to let me know if you are willing to continue with the Pravastatin at an increased dose. If so, I will need to e-prescribe it to your pharmacy.  Your other labs are normal.  Copy to pt. ______

## 2012-01-14 ENCOUNTER — Encounter: Payer: Self-pay | Admitting: *Deleted

## 2012-05-14 ENCOUNTER — Ambulatory Visit: Payer: BC Managed Care – PPO | Admitting: Family Medicine

## 2012-05-19 ENCOUNTER — Other Ambulatory Visit: Payer: Self-pay | Admitting: Family Medicine

## 2012-05-20 ENCOUNTER — Other Ambulatory Visit: Payer: Self-pay | Admitting: *Deleted

## 2012-05-20 MED ORDER — PRAVASTATIN SODIUM 20 MG PO TABS: 40.0000 mg | ORAL_TABLET | Freq: Every day | ORAL | Status: DC

## 2012-06-06 ENCOUNTER — Ambulatory Visit: Payer: Self-pay | Admitting: Family Medicine

## 2012-07-14 ENCOUNTER — Other Ambulatory Visit: Payer: Self-pay | Admitting: Physician Assistant

## 2012-12-02 ENCOUNTER — Encounter: Payer: Self-pay | Admitting: Gastroenterology

## 2012-12-02 ENCOUNTER — Encounter: Payer: Self-pay | Admitting: Pulmonary Disease

## 2013-06-25 ENCOUNTER — Other Ambulatory Visit: Payer: Self-pay | Admitting: Physician Assistant

## 2013-06-29 ENCOUNTER — Telehealth: Payer: Self-pay

## 2013-06-29 MED ORDER — LISINOPRIL-HYDROCHLOROTHIAZIDE 10-12.5 MG PO TABS: 1.0000 | ORAL_TABLET | Freq: Every day | ORAL | Status: DC

## 2013-06-29 NOTE — Telephone Encounter (Signed)
Pt states needs a refill on blood pressure medication, she only has 5 pills left, she is requesting a refill for 30 days. Per pt she is unable to afford an office visit at least for 30 days. Pt is scheduling an appt

## 2013-06-29 NOTE — Telephone Encounter (Signed)
Pt has appt with [name] in April. Refilled BP med for 30 days. Pt advised.

## 2013-07-30 ENCOUNTER — Encounter: Payer: Self-pay | Admitting: Family Medicine

## 2013-07-30 ENCOUNTER — Ambulatory Visit: Payer: Self-pay | Admitting: Family Medicine

## 2013-07-30 VITALS — BP 140/86 | HR 75 | Temp 98.5°F | Resp 16 | Ht 65.0 in | Wt 183.6 lb

## 2013-07-30 DIAGNOSIS — I10 Essential (primary) hypertension: Secondary | ICD-10-CM

## 2013-07-30 DIAGNOSIS — H109 Unspecified conjunctivitis: Secondary | ICD-10-CM

## 2013-07-30 DIAGNOSIS — E785 Hyperlipidemia, unspecified: Secondary | ICD-10-CM

## 2013-07-30 DIAGNOSIS — K5732 Diverticulitis of large intestine without perforation or abscess without bleeding: Secondary | ICD-10-CM

## 2013-07-30 DIAGNOSIS — Z0000 Encounter for general adult medical examination without abnormal findings: Secondary | ICD-10-CM

## 2013-07-30 DIAGNOSIS — N644 Mastodynia: Secondary | ICD-10-CM

## 2013-07-30 LAB — POCT URINALYSIS DIPSTICK
Bilirubin, UA: NEGATIVE
Blood, UA: NEGATIVE
Glucose, UA: NEGATIVE
Ketones, UA: NEGATIVE
Leukocytes, UA: NEGATIVE
Nitrite, UA: NEGATIVE
Protein, UA: NEGATIVE
Spec Grav, UA: 1.02
Urobilinogen, UA: 0.2
pH, UA: 5

## 2013-07-30 LAB — CBC
HCT: 41.3 % (ref 36.0–46.0)
Hemoglobin: 14.6 g/dL (ref 12.0–15.0)
MCH: 29.9 pg (ref 26.0–34.0)
MCHC: 35.4 g/dL (ref 30.0–36.0)
MCV: 84.6 fL (ref 78.0–100.0)
Platelets: 300 10*3/uL (ref 150–400)
RBC: 4.88 MIL/uL (ref 3.87–5.11)
RDW: 15 % (ref 11.5–15.5)
WBC: 7 10*3/uL (ref 4.0–10.5)

## 2013-07-30 MED ORDER — TOBRAMYCIN 0.3 % OP SOLN: 1.0000 [drp] | Freq: Four times a day (QID) | OPHTHALMIC | Status: DC

## 2013-07-30 MED ORDER — CIPROFLOXACIN HCL 500 MG PO TABS: 500.0000 mg | ORAL_TABLET | Freq: Two times a day (BID) | ORAL | Status: DC

## 2013-07-30 MED ORDER — PRAVASTATIN SODIUM 40 MG PO TABS: 40.0000 mg | ORAL_TABLET | Freq: Every day | ORAL | Status: DC

## 2013-07-30 MED ORDER — LISINOPRIL-HYDROCHLOROTHIAZIDE 10-12.5 MG PO TABS: 1.0000 | ORAL_TABLET | Freq: Every day | ORAL | Status: DC

## 2013-07-30 NOTE — Patient Instructions (Signed)

## 2013-07-30 NOTE — Progress Notes (Signed)
Subjective:    Patient ID: Krystal Gibson, female    DOB: Feb 16, 1956, 58 y.o.   MRN: [id number]  HPI This 58 year old woman who works in [employer]. She has 2 sons. Her mother died last year of ovarian cancer.  Patient's had intermittent abdominal pain, dry eyes with redness. She's also noticed some discomfort in the left breast. She's had a history of production mammoplasty  Abdominal pain is accompanied by some bloating and nausea. She has a history of diverticulitis and feels that's what she is suffering from now because the pain is largely in the left lower quadrant. She is able to keep fluids down at this point.  Patient had colonoscopy several years ago.  She feels she is up to date on her immunizations.  Review of Systems  Constitutional: Negative.   HENT: Negative.   Eyes: Positive for discharge, redness and itching.  Respiratory: Negative.   Cardiovascular: Negative.   Gastrointestinal: Positive for abdominal pain, diarrhea and constipation.  Endocrine: Negative.   Genitourinary: Negative.   Musculoskeletal: Positive for back pain and myalgias.  Skin: Negative.   Allergic/Immunologic: Negative.   Neurological: Negative.   Hematological: Negative.   Psychiatric/Behavioral: Positive for sleep disturbance.       Objective:   Physical Exam Alert and in no acute distress, cooperative and friendly Both eyes are injected, fundi are normal, EOM intact, pupils equal and reactive TMs: Normal Oropharynx: Marked dental work, no acute lesions Neck: Supple no adenopathy, no thyromegaly, no bruits Chest: Clear Heart: Regular, soft 1/6 systolic ejection murmur best heard at the left sternal border Breasts exam: Mild nodularity left upper quadrant of the left breast Abdomen: Soft nontender without HSM Genitalia: Normal external female genitalia, normal bimanual exam Extremities: Good pulses, no edema Skin: No rashes or suspicious lesions Results for orders placed in  visit on 01/08/12  LIPID PANEL      Result Value Ref Range   Cholesterol 309 (*) 0 - 200 mg/dL   Triglycerides 176 (*) <150 mg/dL   HDL 41  >39 mg/dL   Total CHOL/HDL Ratio 7.5     VLDL 35  0 - 40 mg/dL   LDL Cholesterol 233 (*) 0 - 99 mg/dL  COMPREHENSIVE METABOLIC PANEL      Result Value Ref Range   Sodium 142  135 - 145 mEq/L   Potassium 3.9  3.5 - 5.3 mEq/L   Chloride 105  96 - 112 mEq/L   CO2 27  19 - 32 mEq/L   Glucose, Bld 90  70 - 99 mg/dL   BUN 13  6 - 23 mg/dL   Creat 0.67  0.50 - 1.10 mg/dL   Total Bilirubin 0.9  0.3 - 1.2 mg/dL   Alkaline Phosphatase 116  39 - 117 U/L   AST 14  0 - 37 U/L   ALT 17  0 - 35 U/L   Total Protein 7.1  6.0 - 8.3 g/dL   Albumin 4.6  3.5 - 5.2 g/dL   Calcium 9.7  8.4 - 10.5 mg/dL  TSH      Result Value Ref Range   TSH 1.255  0.350 - 4.500 uIU/mL        Assessment & Plan:   This is a relatively healthy 58 year old who is problems include chronic intermittent diverticulitis, mild obesity, hyperlipidemia, hypertension. She's had some dry eye issues which may actually be some blepharitis.  She no longer needs PAPs because of hysterectomy and no h/o abnormal PAP.  Diverticulitis of colon (without mention of hemorrhage) - Plan: ciprofloxacin (CIPRO) 500 MG tablet  Conjunctivitis - Plan: tobramycin (TOBREX) 0.3 % ophthalmic solution  Breast pain - Plan: MM Digital Diagnostic Unilat L, CANCELED: MM Digital Diag Ltd L  Other and unspecified hyperlipidemia - Plan: pravastatin (PRAVACHOL) 40 MG tablet  Hypertension - Plan: lisinopril-hydrochlorothiazide (PRINZIDE,ZESTORETIC) 10-12.5 MG per tablet  Signed, Robyn Haber, MD

## 2013-07-31 ENCOUNTER — Ambulatory Visit
Admission: RE | Admit: 2013-07-31 | Discharge: 2013-07-31 | Disposition: A | Discharge status: Home / Self Care | Payer: PRIVATE HEALTH INSURANCE | Source: Ambulatory Visit | Attending: Family Medicine | Admitting: Family Medicine

## 2013-07-31 ENCOUNTER — Ambulatory Visit: Payer: BC Managed Care – PPO | Admitting: Family Medicine

## 2013-07-31 DIAGNOSIS — N644 Mastodynia: Secondary | ICD-10-CM

## 2013-07-31 LAB — LIPID PANEL
Cholesterol: 336 mg/dL — ABNORMAL HIGH (ref 0–200)
HDL: 40 mg/dL (ref >39)
LDL Cholesterol: 270 mg/dL — ABNORMAL HIGH (ref 0–99)
Total CHOL/HDL Ratio: 8.4 Ratio
Triglycerides: 130 mg/dL (ref <150)
VLDL: 26 mg/dL (ref 0–40)

## 2013-07-31 LAB — COMPREHENSIVE METABOLIC PANEL
ALT: 25 U/L (ref 0–35)
AST: 18 U/L (ref 0–37)
Albumin: 4.4 g/dL (ref 3.5–5.2)
Alkaline Phosphatase: 117 U/L (ref 39–117)
BUN: 21 mg/dL (ref 6–23)
CO2: 26 mEq/L (ref 19–32)
Calcium: 9.3 mg/dL (ref 8.4–10.5)
Chloride: 106 mEq/L (ref 96–112)
Creat: 0.68 mg/dL (ref 0.50–1.10)
Glucose, Bld: 90 mg/dL (ref 70–99)
Potassium: 4 mEq/L (ref 3.5–5.3)
Sodium: 142 mEq/L (ref 135–145)
Total Bilirubin: 0.9 mg/dL (ref 0.2–1.2)
Total Protein: 7 g/dL (ref 6.0–8.3)

## 2014-01-21 ENCOUNTER — Encounter: Payer: Self-pay | Admitting: Family Medicine

## 2014-01-21 ENCOUNTER — Ambulatory Visit (INDEPENDENT_AMBULATORY_CARE_PROVIDER_SITE_OTHER): Payer: No Typology Code available for payment source | Admitting: Family Medicine

## 2014-01-21 VITALS — BP 139/83 | HR 70 | Temp 97.8°F | Resp 16 | Ht 66.0 in | Wt 178.0 lb

## 2014-01-21 DIAGNOSIS — J209 Acute bronchitis, unspecified: Secondary | ICD-10-CM

## 2014-01-21 DIAGNOSIS — J302 Other seasonal allergic rhinitis: Secondary | ICD-10-CM | POA: Insufficient documentation

## 2014-01-21 DIAGNOSIS — I10 Essential (primary) hypertension: Secondary | ICD-10-CM

## 2014-01-21 MED ORDER — LISINOPRIL-HYDROCHLOROTHIAZIDE 10-12.5 MG PO TABS: 1.0000 | ORAL_TABLET | Freq: Every day | ORAL | Status: DC

## 2014-01-21 MED ORDER — HYDROCOD POLST-CHLORPHEN POLST 10-8 MG/5ML PO LQCR: 5.0000 mL | Freq: Two times a day (BID) | ORAL | Status: DC

## 2014-01-21 MED ORDER — DESLORATADINE 5 MG PO TABS: 5.0000 mg | ORAL_TABLET | Freq: Every day | ORAL | Status: DC

## 2014-01-21 NOTE — Patient Instructions (Signed)
Acute Bronchitis Bronchitis is inflammation of the airways that extend from the windpipe into the lungs (bronchi). The inflammation often causes mucus to develop. This leads to a cough, which is the most common symptom of bronchitis.  In acute bronchitis, the condition usually develops suddenly and goes away over time, usually in a couple weeks. Smoking, allergies, and asthma can make bronchitis worse. Repeated episodes of bronchitis may cause further lung problems.  CAUSES Acute bronchitis is most often caused by the same virus that causes a cold. The virus can spread from person to person (contagious) through coughing, sneezing, and touching contaminated objects. SIGNS AND SYMPTOMS   Cough.   Fever.   Coughing up mucus.   Body aches.   Chest congestion.   Chills.   Shortness of breath.   Sore throat.  DIAGNOSIS  Acute bronchitis is usually diagnosed through a physical exam. Your health care provider will also ask you questions about your medical history. Tests, such as chest X-rays, are sometimes done to rule out other conditions.  TREATMENT  Acute bronchitis usually goes away in a couple weeks. Oftentimes, no medical treatment is necessary. Medicines are sometimes given for relief of fever or cough. Antibiotic medicines are usually not needed but may be prescribed in certain situations. In some cases, an inhaler may be recommended to help reduce shortness of breath and control the cough. A cool mist vaporizer may also be used to help thin bronchial secretions and make it easier to clear the chest.  HOME CARE INSTRUCTIONS  Get plenty of rest.   Drink enough fluids to keep your urine clear or pale yellow (unless you have a medical condition that requires fluid restriction). Increasing fluids may help thin your respiratory secretions (sputum) and reduce chest congestion, and it will prevent dehydration.   Take medicines only as directed by your health care provider.  If  you were prescribed an antibiotic medicine, finish it all even if you start to feel better.  Avoid smoking and secondhand smoke. Exposure to cigarette smoke or irritating chemicals will make bronchitis worse. If you are a smoker, consider using nicotine gum or skin patches to help control withdrawal symptoms. Quitting smoking will help your lungs heal faster.   Reduce the chances of another bout of acute bronchitis by washing your hands frequently, avoiding people with cold symptoms, and trying not to touch your hands to your mouth, nose, or eyes.   Keep all follow-up visits as directed by your health care provider.  SEEK MEDICAL CARE IF: Your symptoms do not improve after 1 week of treatment.  SEEK IMMEDIATE MEDICAL CARE IF:  You develop an increased fever or chills.   You have chest pain.   You have severe shortness of breath.  You have bloody sputum.   You develop dehydration.  You faint or repeatedly feel like you are going to pass out.  You develop repeated vomiting.  You develop a severe headache. MAKE SURE YOU:   Understand these instructions.  Will watch your condition.  Will get help right away if you are not doing well or get worse. Document Released: 05/17/2004 Document Revised: 08/24/2013 Document Reviewed: 09/30/2012 [facility] Patient Information 2015 [place], [place]. This information is not intended to replace advice given to you by your health care provider. Make sure you discuss any questions you have with your health care provider.   I have prescribed cough medication to be taken twice a day.  Also the allergy pill should be taken in the evening.  Get an over-the-counter nasal spray like AYR and use it as per package directions.

## 2014-01-23 NOTE — Progress Notes (Signed)
Subjective:    Patient ID: Krystal Gibson, female    DOB: 02-Mar-1956, 58 y.o.   MRN: [id number]  Cough Associated symptoms include eye redness, postnasal drip, a sore throat and wheezing. Pertinent negatives include no ear pain or shortness of breath. Her past medical history is significant for environmental allergies.      This 58 y.o. AA female is here for treatment for persistent cough after URI at end of August. She had low-grade fever, no chills, sinus congestion w/ prod cough and chest tightness. Symptomatic treatment w/ OTC medications, including Coricidin HBP, Delsym and Mucinex resulted in gradual improvement after 3 weeks. Pt states she is just starting to improve in last week. She still has raspy voice and NP cough w/ sinus congestion. She has hx of seasonal allergies but takes no medication regularly.  Pt has HTN  and is compliant w/ medication. No report of fatigue, vision disturbances, CP or tightness, palpitations, SOB or DOE, edema, HA, dizziness, lightheadedness, numbness, weakness or syncope.  Patient Active Problem List   Diagnosis Date Noted  . Seasonal allergies 01/21/2014  . Depression 01/08/2012  . Anxiety 06/05/2011  . DIVERTICULITIS OF COLON 06/09/2010  . IBS 06/09/2010  . OBESITY 12/05/2007  . HEADACHE, TENSION 12/05/2007  . Essential hypertension 12/05/2007  . DIVERTICULAR DISEASE 12/05/2007  . DEGENERATIVE JOINT DISEASE, RIGHT SHOULDER 12/05/2007  . DEGENERATIVE JOINT DISEASE, BOTH KNEES, SEVERE 12/05/2007    Prior to Admission medications   Medication Sig Start Date End Date Taking? Authorizing Provider  aspirin 325 MG EC tablet Take 325 mg by mouth daily.   Yes Historical Provider, MD  cholecalciferol (VITAMIN D) 1000 UNITS tablet Take 1,000 Units by mouth daily.   Yes Historical Provider, MD  lisinopril-hydrochlorothiazide (PRINZIDE,ZESTORETIC) 10-12.5 MG per tablet Take 1 tablet by mouth daily. 01/21/14  Yes Barton Fanny, MD    History    Social History  . Marital Status: Single    Spouse Name: N/A    Number of Children: N/A  . Years of Education: N/A   Occupational History  . Not on file.   Social History Main Topics  . Smoking status: Current Every Day Smoker -- 0.50 packs/day    Types: Cigarettes    Last Attempt to Quit: 02/22/2011  . Smokeless tobacco: Not on file  . Alcohol Use: No     Comment: stopped 1990  . Drug Use: No     Comment: stopped 5 yr ago  . Sexual Activity: Not on file   Other Topics Concern  . Not on file   Social History Narrative   Divorced. Education: [employer]. Exercise: Yes.    Review of Systems  Constitutional: Positive for appetite change. Negative for diaphoresis and unexpected weight change.  HENT: Positive for postnasal drip and sore throat. Negative for ear pain, nosebleeds, sneezing and trouble swallowing.   Eyes: Positive for redness. Negative for visual disturbance.  Respiratory: Positive for cough and wheezing. Negative for shortness of breath.   Cardiovascular: Negative.   Musculoskeletal: Negative.   Skin: Negative.   Allergic/Immunologic: Positive for environmental allergies.  Neurological: Negative.        Objective:   Physical Exam  Nursing note and vitals reviewed. Constitutional: She is oriented to person, place, and time. Vital signs are normal. She appears well-developed and well-nourished. No distress.  HENT:  Head: Normocephalic and atraumatic.  Right Ear: Hearing, tympanic membrane, external ear and ear canal normal.  Left Ear: Hearing, tympanic membrane, external ear and ear  canal normal.  Nose: Mucosal edema present. No rhinorrhea, nasal deformity or septal deviation. Right sinus exhibits no maxillary sinus tenderness and no frontal sinus tenderness. Left sinus exhibits no maxillary sinus tenderness and no frontal sinus tenderness.  Mouth/Throat: Uvula is midline and mucous membranes are normal. No oral lesions. Normal dentition. No uvula swelling.  Posterior oropharyngeal erythema present. No oropharyngeal exudate.  Eyes: EOM and lids are normal. Pupils are equal, round, and reactive to light. Right conjunctiva is injected. Left conjunctiva is injected. No scleral icterus.  Neck: Normal range of motion. Neck supple. No thyromegaly present.  Cardiovascular: Normal rate, regular rhythm and normal heart sounds.   Pulmonary/Chest: Effort normal and breath sounds normal. No respiratory distress. She has no wheezes. She has no rales.  Musculoskeletal: Normal range of motion. She exhibits no edema.  Lymphadenopathy:    She has no cervical adenopathy.  Neurological: She is alert and oriented to person, place, and time. No cranial nerve deficit. She exhibits normal muscle tone. Coordination normal.  Skin: Skin is warm and dry. No rash noted. She is not diaphoretic. No erythema.  Psychiatric: She has a normal mood and affect. Her behavior is normal. Judgment and thought content normal.       Assessment & Plan:  Bronchitis, acute, with bronchospasm- RX: Tussionex liquid.  Seasonal allergies- Advised to get OTC saline nasal mist AYR; take antihistamine prescribed daily.  Essential hypertension - Plan: lisinopril-hydrochlorothiazide (PRINZIDE,ZESTORETIC) 10-12.5 MG per tablet   Meds ordered this encounter  Medications  . chlorpheniramine-HYDROcodone (TUSSIONEX) 10-8 MG/5ML LQCR    Sig: Take 5 mLs by mouth 2 (two) times daily.    Dispense:  115 mL    Refill:  0  . desloratadine (CLARINEX) 5 MG tablet    Sig: Take 1 tablet (5 mg total) by mouth daily.    Dispense:  30 tablet    Refill:  5  . lisinopril-hydrochlorothiazide (PRINZIDE,ZESTORETIC) 10-12.5 MG per tablet    Sig: Take 1 tablet by mouth daily.    Dispense:  30 tablet    Refill:  11

## 2014-05-17 ENCOUNTER — Emergency Department (HOSPITAL_COMMUNITY)
Admission: EM | Admit: 2014-05-17 | Discharge: 2014-05-17 | Disposition: A | Discharge status: Home / Self Care | Payer: No Typology Code available for payment source | Attending: Emergency Medicine | Admitting: Emergency Medicine

## 2014-05-17 ENCOUNTER — Emergency Department (HOSPITAL_COMMUNITY): Payer: No Typology Code available for payment source

## 2014-05-17 ENCOUNTER — Encounter (HOSPITAL_COMMUNITY): Payer: Self-pay | Admitting: Emergency Medicine

## 2014-05-17 DIAGNOSIS — Y92410 Unspecified street and highway as the place of occurrence of the external cause: Secondary | ICD-10-CM | POA: Insufficient documentation

## 2014-05-17 DIAGNOSIS — Z720 Tobacco use: Secondary | ICD-10-CM | POA: Diagnosis not present

## 2014-05-17 DIAGNOSIS — M1990 Unspecified osteoarthritis, unspecified site: Secondary | ICD-10-CM | POA: Diagnosis not present

## 2014-05-17 DIAGNOSIS — Z79899 Other long term (current) drug therapy: Secondary | ICD-10-CM | POA: Diagnosis not present

## 2014-05-17 DIAGNOSIS — I10 Essential (primary) hypertension: Secondary | ICD-10-CM | POA: Insufficient documentation

## 2014-05-17 DIAGNOSIS — Z8659 Personal history of other mental and behavioral disorders: Secondary | ICD-10-CM | POA: Diagnosis not present

## 2014-05-17 DIAGNOSIS — Y998 Other external cause status: Secondary | ICD-10-CM | POA: Diagnosis not present

## 2014-05-17 DIAGNOSIS — S4992XA Unspecified injury of left shoulder and upper arm, initial encounter: Secondary | ICD-10-CM | POA: Diagnosis not present

## 2014-05-17 DIAGNOSIS — R52 Pain, unspecified: Secondary | ICD-10-CM

## 2014-05-17 DIAGNOSIS — M25512 Pain in left shoulder: Secondary | ICD-10-CM

## 2014-05-17 DIAGNOSIS — Z8719 Personal history of other diseases of the digestive system: Secondary | ICD-10-CM | POA: Diagnosis not present

## 2014-05-17 DIAGNOSIS — Y9389 Activity, other specified: Secondary | ICD-10-CM | POA: Diagnosis not present

## 2014-05-17 DIAGNOSIS — Z7982 Long term (current) use of aspirin: Secondary | ICD-10-CM | POA: Insufficient documentation

## 2014-05-17 MED ORDER — TRAMADOL HCL 50 MG PO TABS: 50.0000 mg | ORAL_TABLET | Freq: Four times a day (QID) | ORAL | Status: DC | PRN

## 2014-05-17 MED ORDER — METHOCARBAMOL 500 MG PO TABS: 500.0000 mg | ORAL_TABLET | Freq: Two times a day (BID) | ORAL | Status: DC
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
## 2014-05-17 NOTE — Discharge Instructions (Signed)

## 2014-05-27 ENCOUNTER — Ambulatory Visit (INDEPENDENT_AMBULATORY_CARE_PROVIDER_SITE_OTHER): Payer: No Typology Code available for payment source | Admitting: Family Medicine

## 2014-05-27 ENCOUNTER — Encounter: Payer: Self-pay | Admitting: Family Medicine

## 2014-05-27 VITALS — BP 140/80 | HR 77 | Temp 98.2°F | Resp 16 | Ht 65.25 in | Wt 180.0 lb

## 2014-05-27 DIAGNOSIS — K5730 Diverticulosis of large intestine without perforation or abscess without bleeding: Secondary | ICD-10-CM

## 2014-05-27 DIAGNOSIS — I10 Essential (primary) hypertension: Secondary | ICD-10-CM

## 2014-05-27 MED ORDER — METRONIDAZOLE 500 MG PO TABS: 500.0000 mg | ORAL_TABLET | Freq: Two times a day (BID) | ORAL | Status: DC

## 2014-05-27 MED ORDER — CIPROFLOXACIN HCL 500 MG PO TABS: 500.0000 mg | ORAL_TABLET | Freq: Two times a day (BID) | ORAL | Status: DC

## 2014-05-27 NOTE — Patient Instructions (Signed)
I have prescribed 10 days of 2 different antibiotics. Be sure to tell the GI specialist that you have recently taken antibiotics. Usually, colonoscopy is performed at least 6 weeks after most recent infection.  If you have worsening symptoms, get to the ED for treatment.

## 2014-05-29 NOTE — Progress Notes (Signed)
Subjective:    Patient ID: Krystal Gibson, female    DOB: 11-Dec-1955, 59 y.o.   MRN: [id number]  HPI  This 59 y.o. Female has stable HTN and is compliant w/ medication and nutrition. She reports no medication side effects. She also has moderately severe diverticular disease. Recent GI symptoms include intermittent LLQ pain and change in stools. She denies hematochezia or melena but stools are malodorous. She denies fever/chills but has decreased appetite. Stools have been loose, not watery, x 3 days >> occasional inability to control bowels. OTC products do not improve condition. Last hospitalization related to diverticulitis was Feb 2013.  Last CRS reportedly 2012.  Patient Active Problem List   Diagnosis Date Noted  . Seasonal allergies 01/21/2014  . Depression 01/08/2012  . Anxiety 06/05/2011  . DIVERTICULITIS OF COLON 06/09/2010  . IBS 06/09/2010  . OBESITY 12/05/2007  . HEADACHE, TENSION 12/05/2007  . Essential hypertension 12/05/2007  . Diverticulosis of large intestine 12/05/2007  . DEGENERATIVE JOINT DISEASE, RIGHT SHOULDER 12/05/2007  . DEGENERATIVE JOINT DISEASE, BOTH KNEES, SEVERE 12/05/2007    Prior to Admission medications   Medication Sig Start Date End Date Taking? Authorizing Provider  aspirin 325 MG EC tablet Take 325 mg by mouth daily.   Yes Historical Provider, MD  desloratadine (CLARINEX) 5 MG tablet Take 1 tablet (5 mg total) by mouth daily. 01/21/14  Yes Barton Fanny, MD  lisinopril-hydrochlorothiazide (PRINZIDE,ZESTORETIC) 10-12.5 MG per tablet Take 1 tablet by mouth daily. 01/21/14  Yes Barton Fanny, MD  cholecalciferol (VITAMIN D) 1000 UNITS tablet Take 1,000 Units by mouth daily.    Historical Provider, MD  methocarbamol (ROBAXIN) 500 MG tablet Take 1 tablet (500 mg total) by mouth 2 (two) times daily. Patient not taking: Reported on 05/27/2014 05/17/14   Domenic Moras, PA-C  traMADol (ULTRAM) 50 MG tablet Take 1 tablet (50 mg total) by mouth every 6  (six) hours as needed. Patient not taking: Reported on 05/27/2014 05/17/14   Domenic Moras, PA-C    Past Surgical History  Procedure Laterality Date  . Abdominal hysterectomy    . Breast enhancement surgery    . Shoulder surgery    . Orthopedic surgery     History   Social History  . Marital Status: Single    Spouse Name: N/A    Number of Children: N/A  . Years of Education: N/A   Occupational History  . Not on file.   Social History Main Topics  . Smoking status: Current Every Day Smoker -- 0.50 packs/day    Types: Cigarettes    Last Attempt to Quit: 02/22/2011  . Smokeless tobacco: Not on file  . Alcohol Use: No     Comment: stopped 1990  . Drug Use: No     Comment: stopped 5 yr ago  . Sexual Activity: Not on file   Other Topics Concern  . Not on file   Social History Narrative   Divorced. Education: [employer]. Exercise: Yes.    Family History  Problem Relation Age of Onset  . Cancer Mother   . Diabetes Mother   . Hyperlipidemia Mother   . Diabetes Brother   . Hyperlipidemia Brother     Review of Systems  Constitutional: Positive for appetite change and fatigue. Negative for fever, chills and unexpected weight change.  Respiratory: Negative.   Cardiovascular: Negative.   Gastrointestinal: Positive for nausea, diarrhea and abdominal distention. Negative for vomiting, blood in stool, anal bleeding and rectal pain.  Neurological: Negative.  Psychiatric/Behavioral: Positive for sleep disturbance.       Objective:   Physical Exam  Constitutional: She is oriented to person, place, and time. She appears well-developed and well-nourished. No distress.  HENT:  Head: Normocephalic and atraumatic.  Mouth/Throat: Oropharynx is clear and moist. No oropharyngeal exudate.  Eyes: Conjunctivae and EOM are normal. Pupils are equal, round, and reactive to light. No scleral icterus.  Neck: Normal range of motion. Neck supple.  Cardiovascular: Normal rate, regular rhythm and  normal heart sounds.   Pulmonary/Chest: Effort normal and breath sounds normal. No respiratory distress.  Abdominal: Soft. Normal appearance. She exhibits no distension and no mass. Bowel sounds are increased. There is tenderness in the left lower quadrant. There is no rebound, no guarding and no CVA tenderness.  Musculoskeletal: Normal range of motion. She exhibits no edema.  Neurological: She is alert and oriented to person, place, and time. No cranial nerve deficit. Coordination normal.  Skin: Skin is warm and dry. She is not diaphoretic.  Psychiatric: She has a normal mood and affect. Her behavior is normal. Judgment and thought content normal.  Nursing note and vitals reviewed.      Assessment & Plan:  Diverticulosis of large intestine without hemorrhage - Will terat for uncomplicated flare of diverticulitis; dietary restrictions advised. CRS recommended no sooner than 6 weeks after recent bout with flare-up. Pt advised of this. Plan: Ambulatory referral to Gastroenterology  Essential hypertension- Stable on current medications; continue same.  Meds ordered this encounter  Medications  . ciprofloxacin (CIPRO) 500 MG tablet    Sig: Take 1 tablet (500 mg total) by mouth 2 (two) times daily.    Dispense:  20 tablet    Refill:  1  . metroNIDAZOLE (FLAGYL) 500 MG tablet    Sig: Take 1 tablet (500 mg total) by mouth 2 (two) times daily.    Dispense:  20 tablet    Refill:  1

## 2014-06-01 ENCOUNTER — Encounter: Payer: Self-pay | Admitting: Nurse Practitioner

## 2014-06-07 ENCOUNTER — Encounter: Payer: Self-pay | Admitting: Gastroenterology

## 2014-06-10 ENCOUNTER — Ambulatory Visit (INDEPENDENT_AMBULATORY_CARE_PROVIDER_SITE_OTHER): Payer: No Typology Code available for payment source | Admitting: Nurse Practitioner

## 2014-06-10 ENCOUNTER — Encounter: Payer: Self-pay | Admitting: Nurse Practitioner

## 2014-06-10 VITALS — BP 132/82 | HR 78 | Ht 65.5 in | Wt 178.0 lb

## 2014-06-10 DIAGNOSIS — R1084 Generalized abdominal pain: Secondary | ICD-10-CM

## 2014-06-10 MED ORDER — ALIGN PO CAPS: 1.0000 | ORAL_CAPSULE | Freq: Every day | ORAL | Status: DC

## 2014-06-10 MED ORDER — GLYCOPYRROLATE 1 MG PO TABS: 1.0000 mg | ORAL_TABLET | Freq: Two times a day (BID) | ORAL | Status: DC | PRN

## 2014-06-10 NOTE — Patient Instructions (Addendum)
We have sent the following medications to your pharmacy for you to pick up at your convenience: Align, Robinul   Please complete the course of antibiotics and call our office with an update on your condition after you have finished.  [id number] Mcpherson

## 2014-06-10 NOTE — Progress Notes (Signed)
HPI :  Patient is 59 year old female known to Dr. Fuller Plan. She has a history of RIGHT sided diverticulitis documented by CT scan in 2012. Her last colonoscopy was done February 2012 following the episode of diverticulitis.  A polyp was removed, diverticulosis seen. Polyp was not adenomatous.   Patient is referred by her PCP for evaluation of abdominal pain.  Patient gives a history of chronic loose stool, very much influenced by diet. She averages 6 loose stools a day depending on diet. Around the first of this month she had an exacerbation of diarrhea associated with lower abdominal pain. Following that patient had some mild constipation then recurrent diarrhea. Patient was evaluated by her PCP to 05/27/14 at which time Cipro and metronidazole were started for presumed diverticulitis. The patient has had 1 episode of vomiting. No fevers.  She is on day 10 of antibiotics and today is the first day she has had a normal bowel movement though pain not significantly improved.  .  Past Medical History  Diagnosis Date  . Hypertension   . Depression   . Anxiety   . Diverticulosis   . Allergy   . Arthritis   . Colon polyp   . Colitis   . DJD (degenerative joint disease)   . IBS (irritable bowel syndrome)   . Tension headache   . Diverticulitis     Family History  Problem Relation Age of Onset  . Cancer Mother     2014 cervical cancer  . Diabetes Mother   . Hyperlipidemia Mother   . Diabetes Brother   . Hyperlipidemia Brother   . Kidney disease Brother    History  Substance Use Topics  . Smoking status: Current Every Day Smoker -- 0.50 packs/day    Types: Cigarettes    Last Attempt to Quit: 02/22/2011  . Smokeless tobacco: Never Used  . Alcohol Use: No     Comment: stopped 1990   Current Outpatient Prescriptions  Medication Sig Dispense Refill  . aspirin 325 MG EC tablet Take 325 mg by mouth daily.    . cholecalciferol (VITAMIN D) 1000 UNITS tablet Take 1,000 Units by mouth  daily.    . ciprofloxacin (CIPRO) 500 MG tablet Take 1 tablet (500 mg total) by mouth 2 (two) times daily. 20 tablet 1  . desloratadine (CLARINEX) 5 MG tablet Take 1 tablet (5 mg total) by mouth daily. 30 tablet 5  . lisinopril-hydrochlorothiazide (PRINZIDE,ZESTORETIC) 10-12.5 MG per tablet Take 1 tablet by mouth daily. 30 tablet 11  . metroNIDAZOLE (FLAGYL) 500 MG tablet Take 1 tablet (500 mg total) by mouth 2 (two) times daily. 20 tablet 1   No current facility-administered medications for this visit.   Allergies  Allergen Reactions  . Sulfa Antibiotics Swelling    Eyes swollen and redness    Review of Systems: All systems reviewed and negative except where noted in HPI.   Physical Exam: BP 132/82 mmHg  Pulse 78  Ht 5' 5.5" (1.664 m)  Wt 178 lb (80.74 kg)  BMI 29.16 kg/m2 Constitutional: Pleasant,well-developed, black female in no acute distress. HEENT: Normocephalic and atraumatic. Conjunctivae are normal. No scleral icterus. Neck supple.  Cardiovascular: Normal rate, regular rhythm.  Pulmonary/chest: Effort normal and breath sounds normal. No wheezing, rales or rhonchi. Abdominal: Soft, nondistended, moderate diffuse lower abdominal tenderness. Bowel sounds active throughout. There are no masses palpable. No hepatomegaly. Extremities: no edema Lymphadenopathy: No cervical adenopathy noted. Neurological: Alert and oriented to person place and time. Skin: Skin is  warm and dry. No rashes noted. Psychiatric: Normal mood and affect. Behavior is normal.   ASSESSMENT AND PLAN:  59 year old female with IBS. She presents with an exacerbation of loose stool now associated with acute lower abdominal pain. Rule out IBS flare.  Rule out recurrent diverticulitis though not typically associated with diarrhea. Infectious etiology less likely.  > Recommend completion of the Flagyl and metronidazole prescribed by PCP.   > We saw the patient in 2012 for same symptoms at which time Align  and Robinul Forte were prescribed. Will repeat a course of Align and refill the Robinul.   > Patient will call with a condition up date in a few days, or sooner if she develops fever or worsening pain. She may need repeat imaging depending on clinical course.

## 2014-06-11 NOTE — Progress Notes (Signed)
Reviewed and agree with management plan.  Winifred Bodiford T. Audi Wettstein, MD FACG 

## 2014-07-26 ENCOUNTER — Ambulatory Visit: Payer: PRIVATE HEALTH INSURANCE | Admitting: Gastroenterology

## 2015-02-07 ENCOUNTER — Telehealth: Payer: Self-pay

## 2015-02-07 DIAGNOSIS — I10 Essential (primary) hypertension: Secondary | ICD-10-CM

## 2015-02-07 MED ORDER — LISINOPRIL-HYDROCHLOROTHIAZIDE 10-12.5 MG PO TABS: 1.0000 | ORAL_TABLET | Freq: Every day | ORAL | Status: DC

## 2015-02-07 NOTE — Telephone Encounter (Signed)
Rx sent 

## 2015-02-07 NOTE — Telephone Encounter (Signed)
Pt states she had called regarding a refill on her LISINOPRIL 10-12.5 MG didn't know Dr Leward Quan had retired and never received a letter, will make an appt for another PCP soon but Really need her BP medicine also would like a 90 day supply instead of the 30 day since the Dr told her she didn't have to come back for another year Please call [place]

## 2015-02-07 NOTE — Telephone Encounter (Signed)
Left message advising Rx sent in.

## 2015-08-01 ENCOUNTER — Other Ambulatory Visit: Payer: Self-pay | Admitting: Physician Assistant

## 2015-09-23 ENCOUNTER — Ambulatory Visit (INDEPENDENT_AMBULATORY_CARE_PROVIDER_SITE_OTHER): Payer: BLUE CROSS/BLUE SHIELD | Admitting: Family Medicine

## 2015-09-23 ENCOUNTER — Other Ambulatory Visit: Payer: Self-pay | Admitting: Emergency Medicine

## 2015-09-23 VITALS — BP 122/82 | HR 84 | Temp 98.3°F | Resp 16 | Ht 65.0 in | Wt 173.0 lb

## 2015-09-23 DIAGNOSIS — J069 Acute upper respiratory infection, unspecified: Secondary | ICD-10-CM | POA: Diagnosis not present

## 2015-09-23 DIAGNOSIS — R05 Cough: Secondary | ICD-10-CM | POA: Diagnosis not present

## 2015-09-23 DIAGNOSIS — J988 Other specified respiratory disorders: Secondary | ICD-10-CM | POA: Diagnosis not present

## 2015-09-23 DIAGNOSIS — R059 Cough, unspecified: Secondary | ICD-10-CM

## 2015-09-23 DIAGNOSIS — J22 Unspecified acute lower respiratory infection: Secondary | ICD-10-CM

## 2015-09-23 MED ORDER — LISINOPRIL-HYDROCHLOROTHIAZIDE 10-12.5 MG PO TABS: 1.0000 | ORAL_TABLET | Freq: Every day | ORAL | Status: DC

## 2015-09-23 MED ORDER — BENZONATATE 100 MG PO CAPS: 100.0000 mg | ORAL_CAPSULE | Freq: Three times a day (TID) | ORAL | Status: DC | PRN

## 2015-09-23 MED ORDER — AZITHROMYCIN 250 MG PO TABS: ORAL_TABLET | ORAL | Status: DC

## 2015-09-23 NOTE — Progress Notes (Signed)
Subjective:  By signing my name below, I, Moises Blood, attest that this documentation has been prepared under the direction and in the presence of [name], MD. Electronically Signed: Moises Blood, [place]. 09/23/2015 , 8:47 AM .  Patient was seen in Room 2 .   Patient ID: Krystal Gibson, female    DOB: [birth year], 60 y.o.   MRN: [id number] Chief Complaint  Patient presents with  . Nasal Congestion    x 5 days  . Dizziness  . Sore Throat  . Cough   HPI Krystal Gibson is a 60 y.o. female  Patient states feeling ill starting 5-6 days ago. She went to work 4 days ago and had a dizzy spell, thought due to her blood pressure. She noticed having a scratchy throat. Then, the day after, she had a fever (tmax 101). She didn't go to work 2 days ago. She returned to work yesterday and felt rumbling in her chest when she coughs. She notes shortness of breath and wheezing during her coughing fits and also with walking. She reports feeling worse today versus yesterday. She's taken nyquil with relief at night. She denies any known sick contact. She denies any chest pain.   Patient Active Problem List   Diagnosis Date Noted  . Generalized abdominal pain 06/10/2014  . Seasonal allergies 01/21/2014  . Depression 01/08/2012  . Anxiety 06/05/2011  . DIVERTICULITIS OF COLON 06/09/2010  . IBS 06/09/2010  . OBESITY 12/05/2007  . HEADACHE, TENSION 12/05/2007  . Essential hypertension 12/05/2007  . Diverticulosis of large intestine 12/05/2007  . DEGENERATIVE JOINT DISEASE, RIGHT SHOULDER 12/05/2007  . DEGENERATIVE JOINT DISEASE, BOTH KNEES, SEVERE 12/05/2007   Past Medical History  Diagnosis Date  . Hypertension   . Depression   . Anxiety   . Diverticulosis   . Allergy   . Arthritis   . Colon polyp   . Colitis   . DJD (degenerative joint disease)   . IBS (irritable bowel syndrome)   . Tension headache   . Diverticulitis    Past Surgical History  Procedure Laterality Date  .  Abdominal hysterectomy    . Breast enhancement surgery    . Shoulder surgery    . Orthopedic surgery     Allergies  Allergen Reactions  . Sulfa Antibiotics Swelling    Eyes swollen and redness   Prior to Admission medications   Medication Sig Start Date End Date Taking? Authorizing Provider  aspirin 325 MG EC tablet Take 325 mg by mouth daily.   Yes Historical Provider, MD  cholecalciferol (VITAMIN D) 1000 UNITS tablet Take 1,000 Units by mouth daily.   Yes Historical Provider, MD  desloratadine (CLARINEX) 5 MG tablet Take 1 tablet (5 mg total) by mouth daily. 01/21/14  Yes Barton Fanny, MD  lisinopril-hydrochlorothiazide (PRINZIDE,ZESTORETIC) 10-12.5 MG tablet take 1 tablet by mouth once daily 08/01/15  Yes Chelle Jeffery, PA-C  bifidobacterium infantis (ALIGN) capsule Take 1 capsule by mouth daily. Patient not taking: Reported on 09/23/2015 06/10/14   Willia Craze, NP  glycopyrrolate (ROBINUL) 1 MG tablet Take 1 tablet (1 mg total) by mouth 2 (two) times daily as needed. Patient not taking: Reported on 09/23/2015 06/10/14   Willia Craze, NP  metroNIDAZOLE (FLAGYL) 500 MG tablet Take 1 tablet (500 mg total) by mouth 2 (two) times daily. Patient not taking: Reported on 09/23/2015 05/27/14   Barton Fanny, MD   Social History   Social History  . Marital Status: Single  Spouse Name: N/A  . Number of Children: N/A  . Years of Education: N/A   Occupational History  . Not on file.   Social History Main Topics  . Smoking status: Current Every Day Smoker -- 0.50 packs/day    Types: Cigarettes    Last Attempt to Quit: 02/22/2011  . Smokeless tobacco: Never Used  . Alcohol Use: No     Comment: stopped 1990  . Drug Use: No     Comment: stopped 5 yr ago  . Sexual Activity: Not on file   Other Topics Concern  . Not on file   Social History Narrative   Divorced. Education: [employer]. Exercise: Yes.   Review of Systems  Constitutional: Positive for fever and fatigue.  Negative for chills.  HENT: Positive for congestion and sore throat.   Respiratory: Positive for cough, shortness of breath and wheezing.   Cardiovascular: Negative for chest pain.  Gastrointestinal: Negative for nausea, vomiting and diarrhea.  Neurological: Positive for dizziness.      Objective:   Physical Exam  Constitutional: She is oriented to person, place, and time. She appears well-developed and well-nourished. No distress.  HENT:  Head: Normocephalic and atraumatic.  Right Ear: Hearing, tympanic membrane, external ear and ear canal normal.  Left Ear: Hearing, tympanic membrane, external ear and ear canal normal.  Nose: Nose normal.  Mouth/Throat: Oropharynx is clear and moist. No oropharyngeal exudate.  Eyes: Conjunctivae and EOM are normal. Pupils are equal, round, and reactive to light.  Cardiovascular: Normal rate, regular rhythm, normal heart sounds and intact distal pulses.   No murmur heard. Pulmonary/Chest: Effort normal and breath sounds normal. No respiratory distress. She has no wheezes. She has no rhonchi.  Neurological: She is alert and oriented to person, place, and time.  Skin: Skin is warm and dry. No rash noted.  Psychiatric: She has a normal mood and affect. Her behavior is normal.  Vitals reviewed.   Filed Vitals:   09/23/15 0824  BP: 122/82  Pulse: 84  Temp: 98.3 F (36.8 C)  Resp: 16  Height: 5\' 5"  (1.651 m)  Weight: 173 lb (78.472 kg)  SpO2: 98%      Assessment & Plan:    Krystal Gibson is a 60 y.o. female Cough - Plan: benzonatate (TESSALON) 100 MG capsule, azithromycin (ZITHROMAX) 250 MG tablet  LRTI (lower respiratory tract infection) - Plan: azithromycin (ZITHROMAX) 250 MG tablet  Acute upper respiratory infection  Suspected viral infection initially, with low-grade fever. Differential includes influenza, but now day 5 of symptoms. Afebrile in office, lungs clear, O2 sat reassuring. Possible postnasal drip with cough, early  bronchitis, or less likely early community acquired pneumonia.  -Mucinex or Mucinex DM, Tessalon Perles as needed for cough.  -Fluids, relative rest, out of work today.  -If not improving through the weekend or fevers persist, can start antibiotic for lower respiratory tract infection, but suspected viral illness at this time. RTC precautions given.  Meds ordered this encounter  Medications  . benzonatate (TESSALON) 100 MG capsule    Sig: Take 1 capsule (100 mg total) by mouth 3 (three) times daily as needed for cough.    Dispense:  20 capsule    Refill:  0  . azithromycin (ZITHROMAX) 250 MG tablet    Sig: Take 2 pills by mouth on day 1, then 1 pill by mouth per day on days 2 through 5.    Dispense:  6 tablet    Refill:  0   Patient  Instructions       IF you received an x-ray today, you will receive an invoice from [facility]. Please contact [facility] at [phone] with questions or concerns regarding your invoice.   IF you received labwork today, you will receive an invoice from [employer]. Please contact [facility] at [phone] with questions or concerns regarding your invoice.   Our billing staff will not be able to assist you with questions regarding bills from these companies.  You will be contacted with the lab results as soon as they are available. The fastest way to get your results is to activate your My Chart account. Instructions are located on the last page of this paperwork. If you have not heard from [place] regarding the results in 2 weeks, please contact this office.     We recommend that you schedule a mammogram for breast cancer screening. Typically, you do not need a referral to do this. Please contact a local imaging center to schedule your mammogram.  [facility] - [phone]  *ask for the Radiology [place] ([place]) - [phone] or [phone]  [facility] - [phone] [place] [phone] [facility] - [phone]  *ask for the [facility] - [phone]  *ask for the Radiology Department [facility] - [phone]  *ask for the [place] - [phone]  Your symptoms appear to be due to a virus at this point, and even bronchitis initially can be due to a virus. Take Tessalon Perls up to 3 times per day as needed for cough, or Mucinex/Mucinex DM. Drink plenty fluids and rest as needed. If your cough is not improving into next week, or the fevers persist this weekend, start antibiotic as discussed. However if you are not improving within a few days of being on antibiotic, more short of breath, or otherwise worsening, return for recheck here or the emergency room.  Return to the clinic or go to the nearest emergency room if any of your symptoms worsen or new symptoms occur.   Cough, Adult Coughing is a reflex that clears your throat and your airways. Coughing helps to heal and protect your lungs. It is normal to cough occasionally, but a cough that happens with other symptoms or lasts a long time may be a sign of a condition that needs treatment. A cough may last only 2-3 weeks (acute), or it may last longer than 8 weeks (chronic). CAUSES Coughing is commonly caused by:  Breathing in substances that irritate your lungs.  A viral or bacterial respiratory infection.  Allergies.  Asthma.  Postnasal drip.  Smoking.  Acid backing up from the stomach into the esophagus (gastroesophageal reflux).  Certain medicines.  Chronic lung problems, including COPD (or rarely, lung cancer).  Other medical conditions such as heart failure. HOME CARE INSTRUCTIONS  Pay attention to any changes in your symptoms. Take these actions to help with your discomfort:  Take medicines only as told by your health care provider.  If you  were prescribed an antibiotic medicine, take it as told by your health care provider. Do not stop taking the antibiotic even if you start to feel better.  Talk with your health care provider before you take a cough suppressant medicine.  Drink enough fluid to keep your urine clear or pale yellow.  If the air is dry, use a cold steam vaporizer or humidifier  in your bedroom or your home to help loosen secretions.  Avoid anything that causes you to cough at work or at home.  If your cough is worse at night, try sleeping in a semi-upright position.  Avoid cigarette smoke. If you smoke, quit smoking. If you need help quitting, ask your health care provider.  Avoid caffeine.  Avoid alcohol.  Rest as needed. SEEK MEDICAL CARE IF:   You have new symptoms.  You cough up pus.  Your cough does not get better after 2-3 weeks, or your cough gets worse.  You cannot control your cough with suppressant medicines and you are losing sleep.  You develop pain that is getting worse or pain that is not controlled with pain medicines.  You have a fever.  You have unexplained weight loss.  You have night sweats. SEEK IMMEDIATE MEDICAL CARE IF:  You cough up blood.  You have difficulty breathing.  Your heartbeat is very fast.   This information is not intended to replace advice given to you by your health care provider. Make sure you discuss any questions you have with your health care provider.   Document Released: 10/06/2010 Document Revised: 12/29/2014 Document Reviewed: 06/16/2014 Elsevier Interactive Patient Education [employer].     I personally performed the services described in this documentation, which was scribed in my presence. The recorded information has been reviewed and considered, and addended by me as needed.   Signed,   [name], MD [facility].  09/23/2015 8:54 AM

## 2015-09-23 NOTE — Patient Instructions (Addendum)
IF you received an x-ray today, you will receive an invoice from [facility]. Please contact [facility] at [phone] with questions or concerns regarding your invoice.   IF you received labwork today, you will receive an invoice from [employer]. Please contact [facility] at [phone] with questions or concerns regarding your invoice.   Our billing staff will not be able to assist you with questions regarding bills from these companies.  You will be contacted with the lab results as soon as they are available. The fastest way to get your results is to activate your My Chart account. Instructions are located on the last page of this paperwork. If you have not heard from [place] regarding the results in 2 weeks, please contact this office.     We recommend that you schedule a mammogram for breast cancer screening. Typically, you do not need a referral to do this. Please contact a local imaging center to schedule your mammogram.  [facility] - [phone]  *ask for the Radiology [place] ([place]) - [phone] or [phone]  [facility] - [phone] [place] [phone] [facility] - [phone]  *ask for the [facility] - [phone]  *ask for the Radiology Department [facility] - [phone]  *ask for the [place] - [phone]  Your symptoms appear to be due to a virus at this point, and even bronchitis initially can be due to a virus. Take Tessalon Perls up to 3 times per day as needed for cough, or Mucinex/Mucinex DM. Drink plenty fluids and rest as needed. If your cough is not improving into next week, or the fevers persist this weekend, start antibiotic as discussed. However if you are not improving within a few days of being on antibiotic, more short of  breath, or otherwise worsening, return for recheck here or the emergency room.  Return to the clinic or go to the nearest emergency room if any of your symptoms worsen or new symptoms occur.   Cough, Adult Coughing is a reflex that clears your throat and your airways. Coughing helps to heal and protect your lungs. It is normal to cough occasionally, but a cough that happens with other symptoms or lasts a long time may be a sign of a condition that needs treatment. A cough may last only 2-3 weeks (acute), or it may last longer than 8 weeks (chronic). CAUSES Coughing is commonly caused by:  Breathing in substances that irritate your lungs.  A viral or bacterial respiratory infection.  Allergies.  Asthma.  Postnasal drip.  Smoking.  Acid backing up from the stomach into the esophagus (gastroesophageal reflux).  Certain medicines.  Chronic lung problems, including COPD (or rarely, lung cancer).  Other medical conditions such as heart failure. HOME CARE INSTRUCTIONS  Pay attention to any changes in your symptoms. Take these actions to help with your discomfort:  Take medicines only as told by your health care provider.  If you were prescribed an antibiotic medicine, take it as told by your health care provider. Do not stop taking the antibiotic even if you start to feel better.  Talk with your health care provider before you take a cough suppressant medicine.  Drink enough fluid to keep your urine clear or pale yellow.  If the air is dry, use a cold steam vaporizer or humidifier in your bedroom or your  home to help loosen secretions.  Avoid anything that causes you to cough at work or at home.  If your cough is worse at night, try sleeping in a semi-upright position.  Avoid cigarette smoke. If you smoke, quit smoking. If you need help quitting, ask your health care provider.  Avoid caffeine.  Avoid alcohol.  Rest as needed. SEEK MEDICAL CARE IF:   You have new  symptoms.  You cough up pus.  Your cough does not get better after 2-3 weeks, or your cough gets worse.  You cannot control your cough with suppressant medicines and you are losing sleep.  You develop pain that is getting worse or pain that is not controlled with pain medicines.  You have a fever.  You have unexplained weight loss.  You have night sweats. SEEK IMMEDIATE MEDICAL CARE IF:  You cough up blood.  You have difficulty breathing.  Your heartbeat is very fast.   This information is not intended to replace advice given to you by your health care provider. Make sure you discuss any questions you have with your health care provider.   Document Released: 10/06/2010 Document Revised: 12/29/2014 Document Reviewed: 06/16/2014 Elsevier Interactive Patient Education [employer].

## 2015-11-10 ENCOUNTER — Ambulatory Visit (INDEPENDENT_AMBULATORY_CARE_PROVIDER_SITE_OTHER): Payer: BLUE CROSS/BLUE SHIELD | Admitting: Family Medicine

## 2015-11-10 ENCOUNTER — Encounter: Payer: Self-pay | Admitting: Family Medicine

## 2015-11-10 ENCOUNTER — Other Ambulatory Visit: Payer: Self-pay | Admitting: Internal Medicine

## 2015-11-10 ENCOUNTER — Other Ambulatory Visit: Payer: Self-pay | Admitting: Family Medicine

## 2015-11-10 VITALS — BP 126/80 | HR 80 | Temp 98.1°F | Resp 18 | Ht 65.0 in | Wt 170.0 lb

## 2015-11-10 DIAGNOSIS — Z1231 Encounter for screening mammogram for malignant neoplasm of breast: Secondary | ICD-10-CM

## 2015-11-10 DIAGNOSIS — Z0000 Encounter for general adult medical examination without abnormal findings: Secondary | ICD-10-CM

## 2015-11-10 DIAGNOSIS — K5730 Diverticulosis of large intestine without perforation or abscess without bleeding: Secondary | ICD-10-CM

## 2015-11-10 DIAGNOSIS — Z23 Encounter for immunization: Secondary | ICD-10-CM

## 2015-11-10 DIAGNOSIS — I10 Essential (primary) hypertension: Secondary | ICD-10-CM | POA: Diagnosis not present

## 2015-11-10 DIAGNOSIS — K589 Irritable bowel syndrome without diarrhea: Secondary | ICD-10-CM | POA: Diagnosis not present

## 2015-11-10 DIAGNOSIS — E785 Hyperlipidemia, unspecified: Secondary | ICD-10-CM | POA: Diagnosis not present

## 2015-11-10 LAB — POCT URINALYSIS DIP (MANUAL ENTRY)
Glucose, UA: NEGATIVE
Leukocytes, UA: NEGATIVE
NITRITE UA: NEGATIVE
PH UA: 5.5
PROTEIN UA: NEGATIVE
RBC UA: NEGATIVE
Spec Grav, UA: 1.025
Urobilinogen, UA: 0.2

## 2015-11-10 LAB — LIPID PANEL
Cholesterol: 336 mg/dL — ABNORMAL HIGH (ref 125–200)
HDL: 44 mg/dL — ABNORMAL LOW (ref >=46)
LDL Cholesterol: 271 mg/dL — ABNORMAL HIGH (ref <130)
TRIGLYCERIDES: 105 mg/dL (ref <150)
Total CHOL/HDL Ratio: 7.6 Ratio — ABNORMAL HIGH (ref <=5.0)
VLDL: 21 mg/dL (ref <30)

## 2015-11-10 LAB — COMPLETE METABOLIC PANEL WITH GFR
ALT: 12 U/L (ref 6–29)
AST: 11 U/L (ref 10–35)
Albumin: 4.2 g/dL (ref 3.6–5.1)
Alkaline Phosphatase: 113 U/L (ref 33–130)
BILIRUBIN TOTAL: 0.9 mg/dL (ref 0.2–1.2)
BUN: 14 mg/dL (ref 7–25)
CHLORIDE: 104 mmol/L (ref 98–110)
CO2: 27 mmol/L (ref 20–31)
CREATININE: 0.66 mg/dL (ref 0.50–0.99)
Calcium: 9.2 mg/dL (ref 8.6–10.4)
GFR, Est African American: >89 mL/min (ref >=60)
GFR, Est Non African American: >89 mL/min (ref >=60)
Glucose, Bld: 89 mg/dL (ref 65–99)
Potassium: 3.8 mmol/L (ref 3.5–5.3)
Sodium: 140 mmol/L (ref 135–146)
Total Protein: 7 g/dL (ref 6.1–8.1)

## 2015-11-10 MED ORDER — ZOSTER VACCINE LIVE 19400 UNT/0.65ML ~~LOC~~ SUSR: 0.6500 mL | Freq: Once | SUBCUTANEOUS | Status: DC

## 2015-11-10 NOTE — Patient Instructions (Addendum)
: Schedule her mammogram as planned. Follow-up with your gastroenterologist regarding the abdominal pain, or discuss that further at follow-up with me. Also return in the next month or 2 to discuss your knee pain and shoulder pain further. Let me know if you have questions in the meantime.  Once we receive your cholesterol results, can discuss different options on medications, but this can be done through an office visit if possible so we can review what you have taken before, and what specific reactions they caused.   IF you received an x-ray today, you will receive an invoice from [facility]. Please contact [facility] at [phone] with questions or concerns regarding your invoice.   IF you received labwork today, you will receive an invoice from [employer]. Please contact [facility] at [phone] with questions or concerns regarding your invoice.   Our billing staff will not be able to assist you with questions regarding bills from these companies.  You will be contacted with the lab results as soon as they are available. The fastest way to get your results is to activate your My Chart account. Instructions are located on the last page of this paperwork. If you have not heard from [place] regarding the results in 2 weeks, please contact this office.    Keeping You Healthy  Get These Tests  Blood Pressure- Have your blood pressure checked by your healthcare provider at least once a year.  Normal blood pressure is 120/80.  Weight- Have your body mass index (BMI) calculated to screen for obesity.  BMI is a measure of body fat based on height and weight.  You can calculate your own BMI at [employer]  Cholesterol- Have your cholesterol checked every year.  Diabetes- Have your blood sugar checked every year if you have high blood pressure, high cholesterol, a family history of diabetes or if you are overweight.  Pap Test - Have a pap  test every 1 to 5 years if you have been sexually active.  If you are older than 65 and recent pap tests have been normal you may not need additional pap tests.  In addition, if you have had a hysterectomy  for benign disease additional pap tests are not necessary.  Mammogram-Yearly mammograms are essential for early detection of breast cancer  Screening for Colon Cancer- Colonoscopy starting at age 26. Screening may begin sooner depending on your family history and other health conditions.  Follow up colonoscopy as directed by your Gastroenterologist.  Screening for Osteoporosis- Screening begins at age 106 with bone density scanning, sooner if you are at higher risk for developing Osteoporosis.  Get these medicines  Calcium with Vitamin D- Your body requires 1200-1500 mg of Calcium a day and [phone] IU of Vitamin D a day.  You can only absorb 500 mg of Calcium at a time therefore Calcium must be taken in 2 or 3 separate doses throughout the day.  Hormones- Hormone therapy has been associated with increased risk for certain cancers and heart disease.  Talk to your healthcare provider about if you need relief from menopausal symptoms.  Aspirin- Ask your healthcare provider about taking Aspirin to prevent Heart Disease and Stroke.  Get these Immuniztions  Flu shot- Every fall  Pneumonia shot- Once after the age of 20; if you are younger ask your healthcare provider if you need a pneumonia shot.  Tetanus- Every ten years.  Zostavax- Once after the age of 25 to prevent shingles.  Take these steps  Don't smoke- Your healthcare provider can help you quit. For tips on how to quit, ask your healthcare provider or go to www.smokefree.gov or call 1-800 QUIT-NOW.  Be physically active- Exercise 5 days a week for a minimum of 30 minutes.  If you are not already physically active, start slow and gradually work up to 30 minutes of moderate physical activity.  Try walking, dancing, bike riding,  swimming, etc.  Eat a healthy diet- Eat a variety of healthy foods such as fruits, vegetables, whole grains, low fat milk, low fat cheeses, yogurt, lean meats, chicken, fish, eggs, dried beans, tofu, etc.  For more information go to www.thenutritionsource.org  Dental visit- Brush and floss teeth twice daily; visit your dentist twice a year.  Eye exam- Visit your Optometrist or Ophthalmologist yearly.  Drink alcohol in moderation- Limit alcohol intake to one drink or less a day.  Never drink and drive.  Depression- Your emotional health is as important as your physical health.  If you're feeling down or losing interest in things you normally enjoy, please talk to your healthcare provider.  Seat Belts- can save your life; always wear one  Smoke/Carbon Monoxide detectors- These detectors need to be installed on the appropriate level of your home.  Replace batteries at least once a year.  Violence- If anyone is threatening or hurting you, please tell your healthcare provider.  Living Will/ Health care power of attorney- Discuss with your healthcare provider and family.

## 2015-11-10 NOTE — Progress Notes (Addendum)
By signing my name below, I, Krystal Gibson, attest that this documentation has been prepared under the direction and in the presence of Krystal Ray, MD.  Electronically Signed: Verlee Gibson, Medical Scribe. 11/10/2015. 11:32 AM.  Subjective:    Patient ID: Krystal Gibson, female    DOB: 1955-07-14, 60 y.o.   MRN: [id number]  HPI Chief Complaint  Patient presents with  . Annual Exam  . Diverticulitis    not getting better    HPI Comments: BRANWEN Gibson is a 60 y.o. female with a PMHx of HTN, allergic rhinitis, HLD, who presents to the [facility] for her annual physical. previous pt of [name]. Pt reports black tag on her inner right thigh, came off then back - plan on discussing further at future visit.   Diverticulosis and IBS: [name] Plan. He told her if her symptoms continued without changes then he recommended her to get surgery. Pt was treated last inflamation was in 2012. Pt has diarrhea and abdominal pain last year and was given pain medication for her symptoms.  HTN: She takes Zestoretic QD. Pt denies experiencing any side affects on this medication. Pt had an EKG done at the hospital last year that was okay. Lab Results  Component Value Date   CREATININE 0.68 07/30/2013    HLD: She was advised in 2015 to discuss cholesterol medication based on that level. Not currently on statin. Pt states she's taken cholesterol medication (Crestor, Lipitor) but has since stopped them because she doesn't like the side affects they bring- dry mouth, and nausea. Lab Results  Component Value Date   CHOL 336* 07/30/2013   HDL 40 07/30/2013   LDLCALC 270* 07/30/2013   LDLDIRECT 254.5 11/26/2006   TRIG 130 07/30/2013   CHOLHDL 8.4 07/30/2013   Lab Results  Component Value Date   ALT 25 07/30/2013   AST 18 07/30/2013   ALKPHOS 117 07/30/2013   BILITOT 0.9 07/30/2013   Cancer Screening: Colonoscopy: Last done Feb 2012- diverticulosis  with polyp. Planned repeat in 10 years. Breast: Mammogram 2015- no evidence of malignancy, repeat in 1 year. Pt plans on getting her mammogram soon. Pt denies feeling any nodules in her breast. FHx: Pt's niece had breast CA-  part of her breast was removed Cervical: Pt had a partial hysterectomy for benign reasons- she still has her ovaries. Pt was told she didn't need a pap smear. FHx: Mom was 42 y/o when she was dx with cervical CA.  Immunizations: Pt has not had her shingles vaccine. Pt has had her Tetanus vaccine about 7 years ago- from toe injury.  There is no immunization history on file for this patient.  Dentist: Pt hasn't seen a dentist in 2 years, but she plans on getting an appintment. Pt has partial dentures.  Vision: Pt's eye redness and itchiness is from seasonal allergies. Pt sees Dr. [name] annually.  Visual Acuity Screening   Right eye Left eye Both eyes  Without correction:     With correction: 20/30 20/30 20/25    Exercise: Pt isn't exercising as much as she should but pt goes up and down the steps at work. Pt states she does a lot of walking.  Pt stretches in the morning and at night.  Depression: Depression screen [facility] 2/9 11/10/2015 09/23/2015  Decreased Interest 0 0  Down, Depressed, Hopeless 0 0  PHQ - 2 Score 0 0   Patient Active Problem List   Diagnosis Date Noted  .  Generalized abdominal pain 06/10/2014  . Seasonal allergies 01/21/2014  . Depression 01/08/2012  . Anxiety 06/05/2011  . DIVERTICULITIS OF COLON 06/09/2010  . IBS 06/09/2010  . OBESITY 12/05/2007  . HEADACHE, TENSION 12/05/2007  . Essential hypertension 12/05/2007  . Diverticulosis of large intestine 12/05/2007  . DEGENERATIVE JOINT DISEASE, RIGHT SHOULDER 12/05/2007  . DEGENERATIVE JOINT DISEASE, BOTH KNEES, SEVERE 12/05/2007   Past Medical History  Diagnosis Date  . Hypertension   . Depression   . Anxiety   . Diverticulosis   . Allergy   . Arthritis   . Colon polyp   . Colitis   .  DJD (degenerative joint disease)   . IBS (irritable bowel syndrome)   . Tension headache   . Diverticulitis    Past Surgical History  Procedure Laterality Date  . Abdominal hysterectomy    . Breast enhancement surgery    . Shoulder surgery    . Orthopedic surgery    . Breast surgery    . Cesarean section    . Tubal ligation     Allergies  Allergen Reactions  . Sulfa Antibiotics Swelling    Eyes swollen and redness   Prior to Admission medications   Medication Sig Start Date End Date Taking? Authorizing Provider  aspirin 325 MG EC tablet Take 325 mg by mouth daily.   Yes Historical Provider, MD  cholecalciferol (VITAMIN D) 1000 UNITS tablet Take 1,000 Units by mouth daily.   Yes Historical Provider, MD  desloratadine (CLARINEX) 5 MG tablet Take 1 tablet (5 mg total) by mouth daily. 01/21/14  Yes Barton Fanny, MD  lisinopril-hydrochlorothiazide (PRINZIDE,ZESTORETIC) 10-12.5 MG tablet Take 1 tablet by mouth daily. 09/23/15  Yes Wendie Agreste, MD   Social History   Social History  . Marital Status: Single    Spouse Name: N/A  . Number of Children: N/A  . Years of Education: N/A   Occupational History  . Not on file.   Social History Main Topics  . Smoking status: Current Every Day Smoker -- 0.50 packs/day    Types: Cigarettes    Last Attempt to Quit: 02/22/2011  . Smokeless tobacco: Never Used  . Alcohol Use: No     Comment: stopped 1990  . Drug Use: No     Comment: stopped 5 yr ago  . Sexual Activity: Not on file   Other Topics Concern  . Not on file   Social History Narrative   Divorced. Education: [employer]. Exercise: Yes.   Review of Systems  Eyes: Positive for redness and itching.  Gastrointestinal: Positive for vomiting, abdominal pain, diarrhea and constipation.       Hx of diverticulosis and IBS- gastroenterologist Dr. Fuller Plan  Musculoskeletal: Positive for myalgias and arthralgias.  13 point ROS positive for eye redness, eye itchiness, nausea,  vomiting , diarrhea, vomiting, constipation, myalgias, and arthralgias. Objective:  BP 126/80 mmHg  Pulse 80  Temp(Src) 98.1 F (36.7 C) (Oral)  Resp 18  Ht 5\' 5"  (1.651 m)  Wt 170 lb (77.111 kg)  BMI 28.29 kg/m2  SpO2 98%  Physical Exam  Constitutional: She is oriented to person, place, and time. She appears well-developed and well-nourished.  HENT:  Head: Normocephalic and atraumatic.  Right Ear: External ear normal.  Left Ear: External ear normal.  Mouth/Throat: Oropharynx is clear and moist.  Eyes: Conjunctivae are normal. Pupils are equal, round, and reactive to light.  Neck: Normal range of motion. Neck supple. No thyromegaly present.  Cardiovascular: Normal rate, regular rhythm,  normal heart sounds and intact distal pulses.   No murmur heard. Pulmonary/Chest: Effort normal and breath sounds normal. No respiratory distress. She has no wheezes.  Right breast: Aproximately 3 o' clock slightly firm non tender  Abdominal: Soft. Bowel sounds are normal. There is tenderness (slightly sensitive) in the left lower quadrant.  Musculoskeletal: Normal range of motion. She exhibits no edema or tenderness.  Lymphadenopathy:    She has no cervical adenopathy.  Neurological: She is alert and oriented to person, place, and time.  Skin: Skin is warm and dry. No rash noted.  Psychiatric: She has a normal mood and affect. Her behavior is normal. Thought content normal.  Vitals reviewed.  Assessment & Plan:   CHERRE TALFORD is a 60 y.o. female Annual physical exam  --anticipatory guidance as below in AVS, screening labs above. Health maintenance items as above in HPI discussed/recommended as applicable.   Need for shingles vaccine - Plan: Zoster Vaccine Live, PF, (ZOSTAVAX) 16109 UNT/0.65ML injection  Printed to have given at pharmacy.   Hyperlipidemia - Plan: COMPLETE METABOLIC PANEL WITH GFR, Lipid panel  -Check labs, but patient would prefer not to be on statin. Can discuss  different statins and her reactions or side effects with those at an office visit if needed to discuss treatment options if lipids significantly elevated.  IBS (irritable bowel syndrome) Diverticulosis of colon without hemorrhage  -Episodic discomfort. Maybe more IBS than her diverticulosis, or diverticulitis. Reassuring exam today, follow-up with gastroenterologist, or RTC precautions if acute worsening of pain.  Essential hypertension - Plan: COMPLETE METABOLIC PANEL WITH GFR, Lipid panel, POCT urinalysis dipstick  Labs pending, hypertension appears controlled. No change in meds for now.  Other issues that were not deemed to be urgent were briefly discussed, such as knee/shoulder pain, but requested she return to discuss these further due to time constraints with physical.  Meds ordered this encounter  Medications  . Zoster Vaccine Live, PF, (ZOSTAVAX) 60454 UNT/0.65ML injection    Sig: Inject 19,400 Units into the skin once.    Dispense:  1 each    Refill:  0   Patient Instructions   : Schedule her mammogram as planned. Follow-up with your gastroenterologist regarding the abdominal pain, or discuss that further at follow-up with me. Also return in the next month or 2 to discuss your knee pain and shoulder pain further. Let me know if you have questions in the meantime.  Once we receive your cholesterol results, can discuss different options on medications, but this can be done through an office visit if possible so we can review what you have taken before, and what specific reactions they caused.   IF you received an x-Gibson today, you will receive an invoice from [facility]. Please contact [facility] at [phone] with questions or concerns regarding your invoice.   IF you received labwork today, you will receive an invoice from [employer]. Please contact [facility] at [phone] with questions or concerns regarding your invoice.   Our  billing staff will not be able to assist you with questions regarding bills from these companies.  You will be contacted with the lab results as soon as they are available. The fastest way to get your results is to activate your My Chart account. Instructions are located on the last page of this paperwork. If you have not heard from [place] regarding the results in 2 weeks, please contact this office.    Keeping You Healthy  Get These Tests  Blood Pressure- Have your blood pressure checked by your healthcare provider at least once a year.  Normal blood pressure is 120/80.  Weight- Have your body mass index (BMI) calculated to screen for obesity.  BMI is a measure of body fat based on height and weight.  You can calculate your own BMI at [employer]  Cholesterol- Have your cholesterol checked every year.  Diabetes- Have your blood sugar checked every year if you have high blood pressure, high cholesterol, a family history of diabetes or if you are overweight.  Pap Test - Have a pap test every 1 to 5 years if you have been sexually active.  If you are older than 65 and recent pap tests have been normal you may not need additional pap tests.  In addition, if you have had a hysterectomy  for benign disease additional pap tests are not necessary.  Mammogram-Yearly mammograms are essential for early detection of breast cancer  Screening for Colon Cancer- Colonoscopy starting at age 32. Screening may begin sooner depending on your family history and other health conditions.  Follow up colonoscopy as directed by your Gastroenterologist.  Screening for Osteoporosis- Screening begins at age 59 with bone density scanning, sooner if you are at higher risk for developing Osteoporosis.  Get these medicines  Calcium with Vitamin D- Your body requires 1200-1500 mg of Calcium a day and [phone] IU of Vitamin D a day.  You can only absorb 500 mg of Calcium at a time therefore Calcium must be taken  in 2 or 3 separate doses throughout the day.  Hormones- Hormone therapy has been associated with increased risk for certain cancers and heart disease.  Talk to your healthcare provider about if you need relief from menopausal symptoms.  Aspirin- Ask your healthcare provider about taking Aspirin to prevent Heart Disease and Stroke.  Get these Immuniztions  Flu shot- Every fall  Pneumonia shot- Once after the age of 56; if you are younger ask your healthcare provider if you need a pneumonia shot.  Tetanus- Every ten years.  Zostavax- Once after the age of 31 to prevent shingles.  Take these steps  Don't smoke- Your healthcare provider can help you quit. For tips on how to quit, ask your healthcare provider or go to www.smokefree.gov or call 1-800 QUIT-NOW.  Be physically active- Exercise 5 days a week for a minimum of 30 minutes.  If you are not already physically active, start slow and gradually work up to 30 minutes of moderate physical activity.  Try walking, dancing, bike riding, swimming, etc.  Eat a healthy diet- Eat a variety of healthy foods such as fruits, vegetables, whole grains, low fat milk, low fat cheeses, yogurt, lean meats, chicken, fish, eggs, dried beans, tofu, etc.  For more information go to www.thenutritionsource.org  Dental visit- Brush and floss teeth twice daily; visit your dentist twice a year.  Eye exam- Visit your Optometrist or Ophthalmologist yearly.  Drink alcohol in moderation- Limit alcohol intake to one drink or less a day.  Never drink and drive.  Depression- Your emotional health is as important as your physical health.  If you're feeling down or losing interest in things you normally enjoy, please talk to your healthcare provider.  Seat Belts- can save your life; always wear one  Smoke/Carbon Monoxide detectors- These detectors need to be installed on the appropriate level of your home.  Replace batteries at least once a year.  Violence- If  anyone is threatening or hurting you, please  tell your healthcare provider.  Living Will/ Health care power of attorney- Discuss with your healthcare provider and family.    I personally performed the services described in this documentation, which was scribed in my presence. The recorded information has been reviewed and considered, and addended by me as needed.   Signed,   Krystal Ray, MD [facility].  11/11/2015 2:10 PM

## 2015-11-24 ENCOUNTER — Encounter: Payer: Self-pay | Admitting: Family Medicine

## 2015-11-25 ENCOUNTER — Encounter: Payer: Self-pay | Admitting: Family Medicine

## 2015-11-28 ENCOUNTER — Ambulatory Visit
Admission: RE | Admit: 2015-11-28 | Discharge: 2015-11-28 | Disposition: A | Discharge status: Home / Self Care | Payer: BLUE CROSS/BLUE SHIELD | Source: Ambulatory Visit | Attending: Family Medicine | Admitting: Family Medicine

## 2015-11-28 DIAGNOSIS — Z1231 Encounter for screening mammogram for malignant neoplasm of breast: Secondary | ICD-10-CM

## 2015-12-01 ENCOUNTER — Encounter: Payer: Self-pay | Admitting: Family Medicine
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
## 2016-05-17 ENCOUNTER — Telehealth: Payer: Self-pay | Admitting: Gastroenterology

## 2016-05-17 NOTE — Telephone Encounter (Signed)
Miralax daily as needed. Call back if symptoms not improved.

## 2016-05-17 NOTE — Telephone Encounter (Signed)
Patient states she has not had a complete bm in 3 days, today only small amount. She has not tried anything OTC as she is afraid that last time she did ended up with combination constipation/diarrhea. Please advise on what she can take. Thanks.

## 2016-05-17 NOTE — Telephone Encounter (Signed)
Left message for patient to call back  

## 2016-05-18 NOTE — Telephone Encounter (Signed)
Patient notified

## 2017-02-06 ENCOUNTER — Other Ambulatory Visit: Payer: Self-pay | Admitting: Family Medicine

## 2017-02-06 MED ORDER — LISINOPRIL-HYDROCHLOROTHIAZIDE 10-12.5 MG PO TABS: 1.0000 | ORAL_TABLET | Freq: Every day | ORAL | 0 refills | Status: DC

## 2017-02-06 NOTE — Telephone Encounter (Signed)
Refill request for lisinopril-hydrochlorothiazide 10-12.5 mg #15 with no refills.  Pt needs appt no more refills without scheduled and kept appt.  Will send to schedulers to call pt to setup appt.

## 2017-02-25 ENCOUNTER — Ambulatory Visit (INDEPENDENT_AMBULATORY_CARE_PROVIDER_SITE_OTHER): Payer: BLUE CROSS/BLUE SHIELD | Admitting: Family Medicine

## 2017-02-25 ENCOUNTER — Encounter: Payer: Self-pay | Admitting: Family Medicine

## 2017-02-25 VITALS — BP 134/80 | HR 80 | Temp 98.1°F | Resp 18 | Ht 65.0 in | Wt 175.4 lb

## 2017-02-25 DIAGNOSIS — M170 Bilateral primary osteoarthritis of knee: Secondary | ICD-10-CM | POA: Diagnosis not present

## 2017-02-25 DIAGNOSIS — Z789 Other specified health status: Secondary | ICD-10-CM | POA: Diagnosis not present

## 2017-02-25 DIAGNOSIS — K5730 Diverticulosis of large intestine without perforation or abscess without bleeding: Secondary | ICD-10-CM | POA: Diagnosis not present

## 2017-02-25 DIAGNOSIS — F17200 Nicotine dependence, unspecified, uncomplicated: Secondary | ICD-10-CM | POA: Diagnosis not present

## 2017-02-25 DIAGNOSIS — E782 Mixed hyperlipidemia: Secondary | ICD-10-CM

## 2017-02-25 DIAGNOSIS — K582 Mixed irritable bowel syndrome: Secondary | ICD-10-CM | POA: Diagnosis not present

## 2017-02-25 DIAGNOSIS — E785 Hyperlipidemia, unspecified: Secondary | ICD-10-CM

## 2017-02-25 DIAGNOSIS — I10 Essential (primary) hypertension: Secondary | ICD-10-CM | POA: Diagnosis not present

## 2017-02-25 LAB — COMPREHENSIVE METABOLIC PANEL
A/G RATIO: 1.6 (ref 1.2–2.2)
ALBUMIN: 4.5 g/dL (ref 3.6–4.8)
ALT: 19 IU/L (ref 0–32)
AST: 12 IU/L (ref 0–40)
Alkaline Phosphatase: 119 IU/L — ABNORMAL HIGH (ref 39–117)
BUN/Creatinine Ratio: 20 (ref 12–28)
BUN: 16 mg/dL (ref 8–27)
Bilirubin Total: 0.5 mg/dL (ref 0.0–1.2)
CALCIUM: 9.5 mg/dL (ref 8.7–10.3)
CHLORIDE: 101 mmol/L (ref 96–106)
CO2: 22 mmol/L (ref 20–29)
Creatinine, Ser: 0.8 mg/dL (ref 0.57–1.00)
GFR, EST AFRICAN AMERICAN: 91 mL/min/{1.73_m2} (ref >59)
GFR, EST NON AFRICAN AMERICAN: 79 mL/min/{1.73_m2} (ref >59)
Globulin, Total: 2.9 g/dL (ref 1.5–4.5)
Glucose: 93 mg/dL (ref 65–99)
Potassium: 3.8 mmol/L (ref 3.5–5.2)
Sodium: 141 mmol/L (ref 134–144)
TOTAL PROTEIN: 7.4 g/dL (ref 6.0–8.5)

## 2017-02-25 LAB — LIPID PANEL
Chol/HDL Ratio: 6.8 ratio — ABNORMAL HIGH (ref 0.0–4.4)
Cholesterol, Total: 315 mg/dL — ABNORMAL HIGH (ref 100–199)
HDL: 46 mg/dL (ref >39)
LDL Calculated: 247 mg/dL — ABNORMAL HIGH (ref 0–99)
Triglycerides: 112 mg/dL (ref 0–149)
VLDL CHOLESTEROL CAL: 22 mg/dL (ref 5–40)

## 2017-02-25 NOTE — Progress Notes (Signed)
Chief Complaint  Patient presents with  . Establish Care    HPI   Dyslipidemia: Patient presents for evaluation of lipids.  Compliance with treatment thus far has been difficulty. She reports that she is statin intolerant and gets myalgias from statins and cannot take any..  A repeat fasting lipid profile was done.  The patient does not use medications that may worsen dyslipidemias (corticosteroids, progestins, anabolic steroids, diuretics, beta-blockers, amiodarone, cyclosporine, olanzapine). The patient exercises weekly.  The patient is not known to have coexisting coronary artery disease.    Pt reports that she is on chronic aspirin therapy she reports that she was taking 325mg  She states that she gets bruising of the skin very easily She denies bleeding She was taking it for primary prevention  Lab Results  Component Value Date   CHOL 336 (H) 11/10/2015   HDL 44 (L) 11/10/2015   LDLCALC 271 (H) 11/10/2015   LDLDIRECT 254.5 11/26/2006   TRIG 105 11/10/2015   CHOLHDL 7.6 (H) 11/10/2015    Hypertension: Patient here for follow-up of elevated blood pressure. She is exercising and is adherent to low salt diet.  Blood pressure is well controlled at home. Cardiac symptoms none. Patient denies chest pain, chest pressure/discomfort, claudication, fatigue, irregular heart beat, lower extremity edema and near-syncope.  Cardiovascular risk factors: dyslipidemia, hypertension and obesity (BMI >= 30 kg/m2). Use of agents associated with hypertension: none. History of target organ damage: none. BP Readings from Last 3 Encounters:  02/25/17 134/80  11/10/15 126/80  09/23/15 122/82   She denies side effects from her bp pills She does not wake up to urinate She wakes up with night sweats   Chronic knee pain She reports that she has severe arthritis of the knees bilaterally She was seen at [facility] She is currently doing stretches bid and chair exercises She  walks with her grand kids  Chronic diverticulosis and IBS She reports that she has chronic diverticulosis She states that she  She reports that she had some abdominal pain 2 days ago She reports that she started having abdominal pain and diarrhea after the abdominal pain followed by constipation Her last episode of diverticulitis was in 2012  Tobacco Use  She has been smoking  She quit smoking in 2011 And resumed in 2013 She reports that she only smokes at home and smokes about one pack every 3 days She reports that she is stressed and has been using her cigarettes for stress relief   4 review of systems  Past Medical History:  Diagnosis Date  . Allergy   . Anxiety   . Arthritis   . Colitis   . Colon polyp   . Depression   . Diverticulitis   . Diverticulosis   . DJD (degenerative joint disease)   . Hypertension   . IBS (irritable bowel syndrome)   . Tension headache     Current Outpatient Medications  Medication Sig Dispense Refill  . aspirin 325 MG EC tablet Take 325 mg by mouth daily.    . cholecalciferol (VITAMIN D) 1000 UNITS tablet Take 1,000 Units by mouth daily.    Marland Kitchen desloratadine (CLARINEX) 5 MG tablet Take 1 tablet (5 mg total) by mouth daily. 30 tablet 5  . lisinopril-hydrochlorothiazide (PRINZIDE,ZESTORETIC) 10-12.5 MG tablet Take 1 tablet by mouth daily. 15 tablet 0  . Zoster Vaccine Live, PF, (ZOSTAVAX) 41324 UNT/0.65ML injection Inject 19,400 Units into the skin once. (Patient not taking: Reported on 02/25/2017) 1 each 0  No current facility-administered medications for this visit.     Allergies:  Allergies  Allergen Reactions  . Sulfa Antibiotics Swelling    Eyes swollen and redness    Past Surgical History:  Procedure Laterality Date  . ABDOMINAL HYSTERECTOMY    . BREAST ENHANCEMENT SURGERY    . BREAST SURGERY    . CESAREAN SECTION    . ORTHOPEDIC SURGERY    . SHOULDER SURGERY    . TUBAL LIGATION      Social History   Socioeconomic  History  . Marital status: Single    Spouse name: None  . Number of children: None  . Years of education: None  . Highest education level: None  Social Needs  . Financial resource strain: None  . Food insecurity - worry: None  . Food insecurity - inability: None  . Transportation needs - medical: None  . Transportation needs - non-medical: None  Occupational History  . None  Tobacco Use  . Smoking status: Current Every Day Smoker    Packs/day: 0.50    Types: Cigarettes    Last attempt to quit: 02/22/2011    Years since quitting: 6.0  . Smokeless tobacco: Never Used  Substance and Sexual Activity  . Alcohol use: No    Comment: stopped 1990  . Drug use: No    Comment: stopped 5 yr ago  . Sexual activity: None  Other Topics Concern  . None  Social History Narrative   Divorced. Education: [employer]. Exercise: Yes.    Family History  Problem Relation Age of Onset  . Cancer Mother        2014 cervical cancer  . Diabetes Mother   . Hyperlipidemia Mother   . Diabetes Brother   . Hyperlipidemia Brother   . Kidney disease Brother      Review of Systems  Constitutional: Negative for chills, fever and weight loss.  Eyes: Negative for blurred vision and double vision.  Respiratory: Negative for cough, shortness of breath and wheezing.   Cardiovascular: Negative for chest pain, palpitations and leg swelling.  Gastrointestinal: Positive for abdominal pain and diarrhea. Negative for nausea and vomiting.  Genitourinary: Negative for dysuria, frequency and urgency.  Skin: Negative for itching and rash.  Neurological: Negative for dizziness, tingling, tremors and headaches.  Psychiatric/Behavioral: Negative for depression and suicidal ideas.      Objective: Vitals:   02/25/17 0837  BP: 134/80  Pulse: 80  Resp: 18  Temp: 98.1 F (36.7 C)  TempSrc: Oral  SpO2: 97%  Weight: 175 lb 6.4 oz (79.6 kg)  Height: 5\' 5"  (1.651 m)    Physical Exam  Constitutional: She is  oriented to person, place, and time. She appears well-developed and well-nourished.  HENT:  Head: Normocephalic and atraumatic.  Right Ear: External ear normal.  Left Ear: External ear normal.  Eyes: Conjunctivae and EOM are normal.  Neck: Normal range of motion. No thyromegaly present.  Cardiovascular: Normal rate, regular rhythm and normal heart sounds.  No murmur heard. Pulmonary/Chest: Effort normal and breath sounds normal. No stridor. No respiratory distress.  Abdominal: Soft. Bowel sounds are normal. She exhibits no distension and no mass. There is no tenderness. There is no rebound and no guarding.  Musculoskeletal: Normal range of motion. She exhibits no edema.  Neurological: She is alert and oriented to person, place, and time.  Skin: Skin is warm. Capillary refill takes less than 2 seconds. No erythema.  Psychiatric: She has a normal mood and affect. Her behavior  is normal. Judgment and thought content normal.   Lab Results  Component Value Date   CREATININE 0.66 11/10/2015   Lab Results  Component Value Date   K 3.8 11/10/2015     Assessment and Plan Krystal Gibson was seen today for establish care.  Diagnoses and all orders for this visit:  Irritable bowel syndrome with both constipation and diarrhea  Diverticulosis of colon without hemorrhage  Essential hypertension  Primary osteoarthritis of both knees  Tobacco use disorder  Mixed hyperlipidemia  Statin intolerance  Diverticulosis of large intestine without hemorrhage   Problem List Items Addressed This Visit      Cardiovascular and Mediastinum   Essential hypertension    BP in good range        Digestive   IBS - Primary    Discussed probiotic and fiber supplementation         Musculoskeletal and Integument   Osteoarthrosis involving lower leg    Gave handout reviewing exercises for arthritis.         Other   Diverticulosis of large intestine    Continue Align probiotic and fiber       Statin intolerance    Would like patient to see Dr. Einar Gip to discuss Praluent or other monoclonal antibody. She is statin intolerant and her lipids have been high and uncontrolled for over 5 years. She is a 40 pack year smoker as well.       Mixed hyperlipidemia    Discussed that since she cannot tolerate statin she should continue aspirin and see Lipidologist      Tobacco use disorder    Discussed ways to quit Discussed risk of atherosclerosis  Will set up with life coach from [facility]       Other Visit Diagnoses    Diverticulosis of colon without hemorrhage           Amanuel Sinkfield A Nolon Rod

## 2017-02-25 NOTE — Assessment & Plan Note (Signed)
BP in good range

## 2017-02-25 NOTE — Assessment & Plan Note (Signed)
Continue Align probiotic and fiber

## 2017-02-25 NOTE — Assessment & Plan Note (Signed)
Would like patient to see Dr. Einar Gip to discuss Praluent or other monoclonal antibody. She is statin intolerant and her lipids have been high and uncontrolled for over 5 years. She is a 40 pack year smoker as well.

## 2017-02-25 NOTE — Assessment & Plan Note (Signed)
Discussed that since she cannot tolerate statin she should continue aspirin and see Lipidologist

## 2017-02-25 NOTE — Assessment & Plan Note (Addendum)
Gave handout reviewing exercises for arthritis.

## 2017-02-25 NOTE — Assessment & Plan Note (Signed)
Discussed probiotic and fiber supplementation

## 2017-02-25 NOTE — Patient Instructions (Addendum)
   IF you received an x-ray today, you will receive an invoice from [facility]. Please contact [facility] at [phone] with questions or concerns regarding your invoice.   IF you received labwork today, you will receive an invoice from [facility]. Please contact [facility] at [phone] with questions or concerns regarding your invoice.   Our billing staff will not be able to assist you with questions regarding bills from these companies.  You will be contacted with the lab results as soon as they are available. The fastest way to get your results is to activate your My Chart account. Instructions are located on the last page of this paperwork. If you have not heard from us regarding the results in 2 weeks, please contact this office.     Coping with Quitting Smoking Quitting smoking is a physical and mental challenge. You will face cravings, withdrawal symptoms, and temptation. Before quitting, work with your health care provider to make a plan that can help you cope. Preparation can help you quit and keep you from giving in. How can I cope with cravings? Cravings usually last for 5-10 minutes. If you get through it, the craving will pass. Consider taking the following actions to help you cope with cravings:  Keep your mouth busy: ? Chew sugar-free gum. ? Suck on hard candies or a straw. ? Brush your teeth.  Keep your hands and body busy: ? Immediately change to a different activity when you feel a craving. ? Squeeze or play with a ball. ? Do an activity or a hobby, like making bead jewelry, practicing needlepoint, or working with wood. ? Mix up your normal routine. ? Take a short exercise break. Go for a quick walk or run up and down stairs. ? Spend time in public places where smoking is not allowed.  Focus on doing something kind or helpful for someone else.  Call a friend or family member to talk during a craving.  Join a support group.  Call a quit  line, such as 1-800-QUIT-NOW.  Talk with your health care provider about medicines that might help you cope with cravings and make quitting easier for you.  How can I deal with withdrawal symptoms? Your body may experience negative effects as it tries to get used to not having nicotine in the system. These effects are called withdrawal symptoms. They may include:  Feeling hungrier than normal.  Trouble concentrating.  Irritability.  Trouble sleeping.  Feeling depressed.  Restlessness and agitation.  Craving a cigarette.  To manage withdrawal symptoms:  Avoid places, people, and activities that trigger your cravings.  Remember why you want to quit.  Get plenty of sleep.  Avoid coffee and other caffeinated drinks. These may worsen some of your symptoms.  How can I handle social situations? Social situations can be difficult when you are quitting smoking, especially in the first few weeks. To manage this, you can:  Avoid parties, bars, and other social situations where people might be smoking.  Avoid alcohol.  Leave right away if you have the urge to smoke.  Explain to your family and friends that you are quitting smoking. Ask for understanding and support.  Plan activities with friends or family where smoking is not an option.  What are some ways I can cope with stress? Wanting to smoke may cause stress, and stress can make you want to smoke. Find ways to manage your stress. Relaxation techniques can help. For example:  Breathe slowly and deeply, in   through your nose and out through your mouth.  Listen to soothing, relaxing music.  Talk with a family member or friend about your stress.  Light a candle.  Soak in a bath or take a shower.  Think about a peaceful place.  What are some ways I can prevent weight gain? Be aware that many people gain weight after they quit smoking. However, not everyone does. To keep from gaining weight, have a plan in place before  you quit and stick to the plan after you quit. Your plan should include:  Having healthy snacks. When you have a craving, it may help to: ? Eat plain popcorn, crunchy carrots, celery, or other cut vegetables. ? Chew sugar-free gum.  Changing how you eat: ? Eat small portion sizes at meals. ? Eat 4-6 small meals throughout the day instead of 1-2 large meals a day. ? Be mindful when you eat. Do not watch television or do other things that might distract you as you eat.  Exercising regularly: ? Make time to exercise each day. If you do not have time for a long workout, do short bouts of exercise for 5-10 minutes several times a day. ? Do some form of strengthening exercise, like weight lifting, and some form of aerobic exercise, like running or swimming.  Drinking plenty of water or other low-calorie or no-calorie drinks. Drink 6-8 glasses of water daily, or as much as instructed by your health care provider.  Summary  Quitting smoking is a physical and mental challenge. You will face cravings, withdrawal symptoms, and temptation to smoke again. Preparation can help you as you go through these challenges.  You can cope with cravings by keeping your mouth busy (such as by chewing gum), keeping your body and hands busy, and making calls to family, friends, or a helpline for people who want to quit smoking.  You can cope with withdrawal symptoms by avoiding places where people smoke, avoiding drinks with caffeine, and getting plenty of rest.  Ask your health care provider about the different ways to prevent weight gain, avoid stress, and handle social situations. This information is not intended to replace advice given to you by your health care provider. Make sure you discuss any questions you have with your health care provider. Document Released: 04/06/2016 Document Revised: 04/06/2016 Document Reviewed: 04/06/2016 Elsevier Interactive Patient Education  2018 Elsevier Inc.  

## 2017-02-25 NOTE — Assessment & Plan Note (Signed)
Discussed ways to quit Discussed risk of atherosclerosis  Will set up with life coach from [facility]

## 2017-03-12 ENCOUNTER — Other Ambulatory Visit: Payer: Self-pay | Admitting: Family Medicine

## 2017-03-12 ENCOUNTER — Other Ambulatory Visit: Payer: Self-pay

## 2017-03-12 MED ORDER — LISINOPRIL-HYDROCHLOROTHIAZIDE 10-12.5 MG PO TABS: 1.0000 | ORAL_TABLET | Freq: Every day | ORAL | 0 refills | Status: DC

## 2017-03-21 ENCOUNTER — Other Ambulatory Visit: Payer: Self-pay | Admitting: Family Medicine

## 2017-03-21 DIAGNOSIS — Z1231 Encounter for screening mammogram for malignant neoplasm of breast: Secondary | ICD-10-CM

## 2017-03-22 ENCOUNTER — Ambulatory Visit: Payer: BLUE CROSS/BLUE SHIELD

## 2017-03-25 ENCOUNTER — Encounter: Payer: Self-pay | Admitting: Gastroenterology

## 2017-03-25 ENCOUNTER — Ambulatory Visit (INDEPENDENT_AMBULATORY_CARE_PROVIDER_SITE_OTHER): Payer: BLUE CROSS/BLUE SHIELD | Admitting: Gastroenterology

## 2017-03-25 VITALS — BP 124/88 | HR 72 | Ht 65.0 in | Wt 172.1 lb

## 2017-03-25 DIAGNOSIS — K921 Melena: Secondary | ICD-10-CM | POA: Diagnosis not present

## 2017-03-25 DIAGNOSIS — K582 Mixed irritable bowel syndrome: Secondary | ICD-10-CM

## 2017-03-25 DIAGNOSIS — K5730 Diverticulosis of large intestine without perforation or abscess without bleeding: Secondary | ICD-10-CM

## 2017-03-25 MED ORDER — GLYCOPYRROLATE 2 MG PO TABS: 2.0000 mg | ORAL_TABLET | Freq: Two times a day (BID) | ORAL | 11 refills | Status: DC

## 2017-03-25 MED ORDER — NA SULFATE-K SULFATE-MG SULF 17.5-3.13-1.6 GM/177ML PO SOLN: 1.0000 | Freq: Once | ORAL | 0 refills | Status: AC

## 2017-03-25 NOTE — Patient Instructions (Addendum)
We have sent the following medications to your pharmacy for you to pick up at your convenience: glycopyrrolate.  You have been scheduled for a colonoscopy. Please follow written instructions given to you at your visit today.  Please pick up your prep supplies at the pharmacy within the next 1-3 days. If you use inhalers (even only as needed), please bring them with you on the day of your procedure. Your physician has requested that you go to www.startemmi.com and enter the access code given to you at your visit today. This web site gives a general overview about your procedure. However, you should still follow specific instructions given to you by our office regarding your preparation for the procedure.  Normal BMI (Body Mass Index- based on height and weight) is between 19 and 25. Your BMI today is Body mass index is 28.64 kg/m. Marland Kitchen Please consider follow up  regarding your BMI with your Primary Care Provider.  Thank you for choosing me and [facility].  Pricilla Riffle. Dagoberto Ligas., MD., Marval Regal

## 2017-03-25 NOTE — Progress Notes (Signed)
    History of Present Illness: This is a 61 year old female with IBS who relates bright red rectal bleeding 2 occasions last week.  She states she had a normal bowel movement and noticed bright red blood in the commode in the stool and upon wiping.  Following day she had similar symptoms but the amount of blood was lower.  No bleeding since last Friday.  She has ongoing problems with alternating diarrhea and constipation associated with lower abdominal pain relieved by bowel movements.  She has not been on an antispasmodic recently.     Current Medications, Allergies, Past Medical History, Past Surgical History, Family History and Social History were reviewed in [employer] record.  Physical Exam: General: Well developed, well nourished, no acute distress Head: Normocephalic and atraumatic Eyes:  sclerae anicteric, EOMI Ears: Normal auditory acuity Mouth: No deformity or lesions Lungs: Clear throughout to auscultation Heart: Regular rate and rhythm; no murmurs, rubs or bruits Abdomen: Soft, mild lower tenderness and non distended. No masses, hepatosplenomegaly or hernias noted. Normal Bowel sounds Rectal: Small external tags, no other lesions, heme neg brown stool  Musculoskeletal: Symmetrical with no gross deformities  Pulses:  Normal pulses noted Extremities: No clubbing, cyanosis, edema or deformities noted Neurological: Alert oriented x 4, grossly nonfocal Psychological:  Alert and cooperative. Normal mood and affect  Assessment and Recommendations:  1. Hematochezia. R/O hemorrhoids, neoplasm. Schedule colonoscopy. The risks (including bleeding, perforation, infection, missed lesions, medication reactions and possible hospitalization or surgery if complications occur), benefits, and alternatives to colonoscopy with possible biopsy and possible polypectomy were discussed with the patient and they consent to proceed.   2.  IBS with alternating pattern.  Glycopyrrolate 2 mg po bid.   3.  Diverticulosis with history of diverticulitis.  Long-term high-fiber diet with a daily fiber supplements and adequate daily water intake.

## 2017-03-28 ENCOUNTER — Encounter: Payer: Self-pay | Admitting: Gastroenterology

## 2017-04-22 ENCOUNTER — Ambulatory Visit: Payer: BLUE CROSS/BLUE SHIELD

## 2017-04-22 ENCOUNTER — Ambulatory Visit
Admission: RE | Admit: 2017-04-22 | Discharge: 2017-04-22 | Disposition: A | Discharge status: Home / Self Care | Payer: BLUE CROSS/BLUE SHIELD | Source: Ambulatory Visit | Attending: Family Medicine | Admitting: Family Medicine

## 2017-04-22 DIAGNOSIS — Z1231 Encounter for screening mammogram for malignant neoplasm of breast: Secondary | ICD-10-CM

## 2017-05-01 ENCOUNTER — Encounter: Payer: Self-pay | Admitting: Gastroenterology

## 2017-05-03 ENCOUNTER — Telehealth: Payer: Self-pay | Admitting: Gastroenterology

## 2017-05-03 MED ORDER — PEG 3350-KCL-NABCB-NACL-NASULF 236 G PO SOLR: 4000.0000 mL | Freq: Once | ORAL | 0 refills | Status: AC

## 2017-05-03 NOTE — Telephone Encounter (Signed)
Patient states she was told by her pharmacy that her prep was not approved with her insurance company and she was wondering if she needed to do something. I informed patient that most insurance companies do not approve the newer prep kits and we can switch her to another prep if this one is not affordable but it is a higher volume prep. Patient states she will just pay for the prep and contactus if she changes her mind.

## 2017-05-03 NOTE — Telephone Encounter (Signed)
Informed patient that we can send in an alternative prep called Golytely to her pharmacy and send new instructions through my chart. Patient verbalized understanding and will print and call back with any questions.

## 2017-05-03 NOTE — Addendum Note (Signed)
Addended by: Marzella Schlein on: 05/03/2017 03:21 PM   Modules accepted: Orders

## 2017-05-03 NOTE — Telephone Encounter (Signed)
Patient states she cannot afford the prep and would like a call back to switch to a more affordable prep. Best call back [phone].

## 2017-05-07 ENCOUNTER — Encounter: Payer: Self-pay | Admitting: Gastroenterology

## 2017-05-07 ENCOUNTER — Ambulatory Visit (AMBULATORY_SURGERY_CENTER): Payer: BLUE CROSS/BLUE SHIELD | Admitting: Gastroenterology

## 2017-05-07 ENCOUNTER — Other Ambulatory Visit: Payer: Self-pay

## 2017-05-07 VITALS — BP 143/89 | HR 63 | Temp 97.3°F | Resp 23 | Ht 65.0 in | Wt 172.0 lb

## 2017-05-07 DIAGNOSIS — K921 Melena: Secondary | ICD-10-CM

## 2017-05-07 MED ORDER — SODIUM CHLORIDE 0.9 % IV SOLN: 500.0000 mL | Freq: Once | INTRAVENOUS | Status: DC

## 2017-05-07 NOTE — Progress Notes (Signed)
Report to PACU, RN, vss, BBS= Clear.  

## 2017-05-07 NOTE — Op Note (Signed)
[place] Patient Name: Krystal Gibson Procedure Date: 05/07/2017 2:38 PM MRN: [id number] Endoscopist: Ladene Artist , MD Age: 62 Referring MD:  Date of Birth: [birth year] Gender: Female Account #: [id number] Procedure:                Colonoscopy Indications:              Hematochezia Medicines:                Monitored Anesthesia Care Procedure:                Pre-Anesthesia Assessment:                           - Prior to the procedure, a History and Physical                            was performed, and patient medications and                            allergies were reviewed. The patient's tolerance of                            previous anesthesia was also reviewed. The risks                            and benefits of the procedure and the sedation                            options and risks were discussed with the patient.                            All questions were answered, and informed consent                            was obtained. Prior Anticoagulants: The patient has                            taken no previous anticoagulant or antiplatelet                            agents. ASA Grade Assessment: II - A patient with                            mild systemic disease. After reviewing the risks                            and benefits, the patient was deemed in                            satisfactory condition to undergo the procedure.                           After obtaining informed consent, the colonoscope  was passed under direct vision. Throughout the                            procedure, the patient's blood pressure, pulse, and                            oxygen saturations were monitored continuously. The                            Model PCF-[id number]) scope was introduced                            through the anus and advanced to the the cecum,                            identified by appendiceal orifice and  ileocecal                            valve. The ileocecal valve, appendiceal orifice,                            and rectum were photographed. The quality of the                            bowel preparation was adequate. The colonoscopy was                            performed without difficulty. The patient tolerated                            the procedure well. Scope In: 2:46:03 PM Scope Out: 2:55:15 PM Scope Withdrawal Time: 0 hours 7 minutes 59 seconds  Total Procedure Duration: 0 hours 9 minutes 12 seconds  Findings:                 The perianal and digital rectal examinations were                            normal.                           The exam was otherwise without abnormality on                            direct and retroflexion views.                           Many medium-mouthed diverticula were found in the                            hepatic flexure, ascending colon and cecum. There                            was no evidence of diverticular bleeding.  Many medium-mouthed diverticula were found in the                            sigmoid colon, descending colon, splenic flexure                            and transverse colon. There was evidence of                            diverticular spasm.                           Internal hemorrhoids were found during                            retroflexion. The hemorrhoids were small and Grade                            I (internal hemorrhoids that do not prolapse). Complications:            No immediate complications. Estimated blood loss:                            None. Estimated Blood Loss:     Estimated blood loss: none. Impression:               - The examination was otherwise normal on direct                            and retroflexion views.                           - Moderate diverticulosis at the hepatic flexure,                            in the ascending colon and in the cecum. There was                             no evidence of diverticular bleeding.                           - Severe diverticulosis in the sigmoid colon, in                            the descending colon, at the splenic flexure and in                            the transverse colon. There was evidence of                            diverticular spasm.                           - Internal hemorrhoids.                           -  No specimens collected. Recommendation:           - Repeat colonoscopy in 10 years for screening                            purposes.                           - Patient has a contact number available for                            emergencies. The signs and symptoms of potential                            delayed complications were discussed with the                            patient. Return to normal activities tomorrow.                            Written discharge instructions were provided to the                            patient.                           - Continue present medications.                           - High fiber diet long term.                           - Preparation H supp (OTC) pr bid prn. Ladene Artist, MD 05/07/2017 2:59:11 PM This report has been signed electronically.

## 2017-05-07 NOTE — Patient Instructions (Signed)
YOU HAD AN ENDOSCOPIC PROCEDURE TODAY AT [facility]:   Refer to the procedure report that was given to you for any specific questions about what was found during the examination.  If the procedure report does not answer your questions, please call your gastroenterologist to clarify.  If you requested that your care partner not be given the details of your procedure findings, then the procedure report has been included in a sealed envelope for you to review at your convenience later.  YOU SHOULD EXPECT: Some feelings of bloating in the abdomen. Passage of more gas than usual.  Walking can help get rid of the air that was put into your GI tract during the procedure and reduce the bloating. If you had a lower endoscopy (such as a colonoscopy or flexible sigmoidoscopy) you may notice spotting of blood in your stool or on the toilet paper. If you underwent a bowel prep for your procedure, you may not have a normal bowel movement for a few days.  Please Note:  You might notice some irritation and congestion in your nose or some drainage.  This is from the oxygen used during your procedure.  There is no need for concern and it should clear up in a day or so.  SYMPTOMS TO REPORT IMMEDIATELY:   Following lower endoscopy (colonoscopy or flexible sigmoidoscopy):  Excessive amounts of blood in the stool  Significant tenderness or worsening of abdominal pains  Swelling of the abdomen that is new, acute  Fever of 100F or higher  Please see information on diverticulosis, hemorrhoids, and high fiber diet.  For urgent or emergent issues, a gastroenterologist can be reached at any hour by calling [phone].   DIET:  We do recommend a small meal at first, but then you may proceed to your regular diet.  Drink plenty of fluids but you should avoid alcoholic beverages for 24 hours.  ACTIVITY:  You should plan to take it easy for the rest of today and you should NOT DRIVE or use heavy  machinery until tomorrow (because of the sedation medicines used during the test).    FOLLOW UP: Our staff will call the number listed on your records the next business day following your procedure to check on you and address any questions or concerns that you may have regarding the information given to you following your procedure. If we do not reach you, we will leave a message.  However, if you are feeling well and you are not experiencing any problems, there is no need to return our call.  We will assume that you have returned to your regular daily activities without incident.  If any biopsies were taken you will be contacted by phone or by letter within the next 1-3 weeks.  Please call us at [phone] if you have not heard about the biopsies in 3 weeks.    SIGNATURES/CONFIDENTIALITY: You and/or your care partner have signed paperwork which will be entered into your electronic medical record.  These signatures attest to the fact that that the information above on your After Visit Summary has been reviewed and is understood.  Full responsibility of the confidentiality of this discharge information lies with you and/or your care-partner.  Thank you for allowing [place] to provide your healthcare today.

## 2017-05-08 ENCOUNTER — Telehealth: Payer: Self-pay

## 2017-05-08 ENCOUNTER — Telehealth: Payer: Self-pay | Admitting: *Deleted

## 2017-05-08 NOTE — Telephone Encounter (Signed)
Attempted to reach patient for post-procedure f/u call. No answer. Left message that we will make another attempt to contact her later today and for her to please not hesitate to call us if she has any questions/concerns regarding her care.

## 2017-05-08 NOTE — Telephone Encounter (Signed)
  Follow up Call-  Call back number 05/07/2017  Post procedure Call Back phone  # [phone]  Permission to leave phone message Yes  Some recent data might be hidden     Patient questions:  Do you have a fever, pain , or abdominal swelling? No. Pain Score  0 *  Have you tolerated food without any problems? Yes.    Have you been able to return to your normal activities? Yes.    Do you have any questions about your discharge instructions: Diet   No. Medications  No. Follow up visit  No.  Do you have questions or concerns about your Care? No.  Actions: * If pain score is 4 or above: No action needed, pain <4.

## 2017-07-15 ENCOUNTER — Other Ambulatory Visit: Payer: Self-pay | Admitting: Family Medicine

## 2017-07-15 NOTE — Telephone Encounter (Signed)
Refill request approve for lisinopril-hctz 10-12.5 mg #30 with no refills. Pt needs office visit.  Will send to scheduling pool to call pt and schedule with [facility]ings for f/u htn. Dgaddy, CMA

## 2017-08-18 ENCOUNTER — Other Ambulatory Visit: Payer: Self-pay | Admitting: Family Medicine

## 2017-08-21 ENCOUNTER — Encounter: Payer: Self-pay | Admitting: Family Medicine

## 2017-08-22 ENCOUNTER — Encounter: Payer: Self-pay | Admitting: Family Medicine

## 2017-08-26 ENCOUNTER — Encounter: Payer: Self-pay | Admitting: Family Medicine

## 2017-09-11 ENCOUNTER — Telehealth: Payer: Self-pay | Admitting: Family Medicine

## 2017-09-11 NOTE — Telephone Encounter (Signed)
Looks like she was given half prescription by Jenny Reichmann and told needed to follow up.     Please Advise

## 2017-09-11 NOTE — Telephone Encounter (Signed)
Copied from [place] [id number]. Topic: Quick Communication - See Telephone Encounter >> Sep 11, 2017 12:42 PM Cleaster Corin, NT wrote: CRM for notification. See Telephone encounter for: 09/11/17.  Pt. Doesn't have the funds for follow up appt. Due to self pay and would rather wait to make an appt. Due to going to see other providers. Pt. Would like to see if Dr. Nolon Rod would send a refill for med. lisinopril-hydrochlorothiazide (PRINZIDE,ZESTORETIC) 10-12.5 MG tablet [[id number]] she only has one pill left. Pt. Stated  also stated she didn't know why she needed to make a follow up appt. Due to being sent to a cardiologist. If able to get rx. Pt. Would like it sent to [facility].   [facility] [phone] [place] [address] Phone: [phone] Fax: [phone]

## 2017-09-12 ENCOUNTER — Encounter: Payer: Self-pay | Admitting: Family Medicine

## 2017-09-12 MED ORDER — LISINOPRIL-HYDROCHLOROTHIAZIDE 10-12.5 MG PO TABS: 1.0000 | ORAL_TABLET | Freq: Every day | ORAL | 0 refills | Status: DC

## 2017-10-07 ENCOUNTER — Other Ambulatory Visit: Payer: Self-pay | Admitting: Family Medicine

## 2017-10-07 ENCOUNTER — Ambulatory Visit (INDEPENDENT_AMBULATORY_CARE_PROVIDER_SITE_OTHER): Payer: BLUE CROSS/BLUE SHIELD | Admitting: Family Medicine

## 2017-10-07 ENCOUNTER — Encounter: Payer: Self-pay | Admitting: Family Medicine

## 2017-10-07 ENCOUNTER — Other Ambulatory Visit: Payer: Self-pay

## 2017-10-07 VITALS — BP 136/88 | HR 89 | Temp 98.4°F | Resp 17 | Ht 65.0 in | Wt 173.0 lb

## 2017-10-07 DIAGNOSIS — K582 Mixed irritable bowel syndrome: Secondary | ICD-10-CM

## 2017-10-07 DIAGNOSIS — F17200 Nicotine dependence, unspecified, uncomplicated: Secondary | ICD-10-CM

## 2017-10-07 DIAGNOSIS — E782 Mixed hyperlipidemia: Secondary | ICD-10-CM | POA: Diagnosis not present

## 2017-10-07 DIAGNOSIS — I10 Essential (primary) hypertension: Secondary | ICD-10-CM | POA: Diagnosis not present

## 2017-10-07 MED ORDER — ATORVASTATIN CALCIUM 40 MG PO TABS: 20.0000 mg | ORAL_TABLET | Freq: Every day | ORAL | 1 refills | Status: DC

## 2017-10-07 NOTE — Progress Notes (Signed)
Chief Complaint  Patient presents with  . Medication Refill    follow up.  Needs refill on lipitor, vitamin d, clarinex, robinul, and lisinopril-hctz    HPI  Encounter for medication refills IBS She reports that she takes Robinul prn but states that it makes her have dry mouth  She states that it helps her IBS and diverticulitis She states that the Robinul helps but she uses it sparingly to avoid the dry mouth  Smoking She reports that she has been smoking since age 55 She quit in pregnancy, she quit in 2010-2013 with a support team She states that now she is using the nicotine gum to quit smoking  Hypertension: Patient here for follow-up of elevated blood pressure. She is exercising and is adherent to low salt diet.  Blood pressure is well controlled at home. Cardiac symptoms none. Patient denies chest pressure/discomfort, claudication, dyspnea, exertional chest pressure/discomfort, fatigue, lower extremity edema, near-syncope, orthopnea, paroxysmal nocturnal dyspnea and syncope.  Cardiovascular risk factors: dyslipidemia, hypertension and smoking/ tobacco exposure. Use of agents associated with hypertension: none. History of target organ damage: none. BP Readings from Last 3 Encounters:  10/07/17 136/88  05/07/17 (!) 143/89  03/25/17 124/88   Dyslipidemia: Patient presents for evaluation of lipids.  Compliance with treatment thus far has been good.  A repeat fasting lipid profile was done.  The patient does not use medications that may worsen dyslipidemias (corticosteroids, progestins, anabolic steroids, diuretics, beta-blockers, amiodarone, cyclosporine, olanzapine).  The patient is not known to have coexisting coronary artery disease.  Cardiology increased her Atorvastatin to 40mg   Lab Results  Component Value Date   CHOL 315 (H) 02/25/2017   CHOL 336 (H) 11/10/2015   CHOL 336 (H) 07/30/2013   Lab Results  Component Value Date   HDL 46 02/25/2017   HDL 44 (L) 11/10/2015   HDL 40 07/30/2013   Lab Results  Component Value Date   LDLCALC 247 (H) 02/25/2017   LDLCALC 271 (H) 11/10/2015   LDLCALC 270 (H) 07/30/2013   Lab Results  Component Value Date   TRIG 112 02/25/2017   TRIG 105 11/10/2015   TRIG 130 07/30/2013   Lab Results  Component Value Date   CHOLHDL 6.8 (H) 02/25/2017   CHOLHDL 7.6 (H) 11/10/2015   CHOLHDL 8.4 07/30/2013   Lab Results  Component Value Date   LDLDIRECT 254.5 11/26/2006   LDLDIRECT 258.0 06/26/2006       Past Medical History:  Diagnosis Date  . Allergy   . Anxiety   . Arthritis   . Colitis   . Colon polyp   . Depression   . Diverticulitis   . Diverticulosis   . DJD (degenerative joint disease)   . Hyperlipidemia   . Hypertension   . IBS (irritable bowel syndrome)   . Tension headache     Current Outpatient Medications  Medication Sig Dispense Refill  . atorvastatin (LIPITOR) 20 MG tablet daily.  0  . cholecalciferol (VITAMIN D) 1000 UNITS tablet Take 1,000 Units by mouth daily.    Marland Kitchen desloratadine (CLARINEX) 5 MG tablet Take 1 tablet (5 mg total) by mouth daily. (Patient taking differently: Take 5 mg by mouth as needed. ) 30 tablet 5  . glycopyrrolate (ROBINUL) 2 MG tablet Take 1 tablet (2 mg total) by mouth 2 (two) times daily. 60 tablet 11  . lisinopril-hydrochlorothiazide (PRINZIDE,ZESTORETIC) 10-12.5 MG tablet Take 1 tablet by mouth daily. 90 tablet 0   Current Facility-Administered Medications  Medication Dose Route Frequency Provider Last  Rate Last Dose  . 0.9 %  sodium chloride infusion  500 mL Intravenous Once Ladene Artist, MD        Allergies:  Allergies  Allergen Reactions  . Sulfa Antibiotics Swelling    Eyes swollen and redness    Past Surgical History:  Procedure Laterality Date  . ABDOMINAL HYSTERECTOMY    . BREAST ENHANCEMENT SURGERY    . BREAST SURGERY    . CESAREAN SECTION    . COLONOSCOPY    . ORTHOPEDIC SURGERY    . REDUCTION MAMMAPLASTY Bilateral    1980  . SHOULDER  SURGERY    . TUBAL LIGATION    . UPPER GASTROINTESTINAL ENDOSCOPY      Social History   Socioeconomic History  . Marital status: Single    Spouse name: Not on file  . Number of children: 2  . Years of education: Not on file  . Highest education level: Not on file  Occupational History  . Occupation: semi-retired  Social Needs  . Financial resource strain: Not on file  . Food insecurity:    Worry: Not on file    Inability: Not on file  . Transportation needs:    Medical: Not on file    Non-medical: Not on file  Tobacco Use  . Smoking status: Current Every Day Smoker    Packs/day: 0.50    Types: Cigarettes    Last attempt to quit: 02/22/2011    Years since quitting: 6.6  . Smokeless tobacco: Never Used  . Tobacco comment: havent had a cigarette in three weeks  Substance and Sexual Activity  . Alcohol use: No    Comment: stopped 1990  . Drug use: No    Comment: stopped 5 yr ago  . Sexual activity: Not on file  Lifestyle  . Physical activity:    Days per week: Not on file    Minutes per session: Not on file  . Stress: Not on file  Relationships  . Social connections:    Talks on phone: Not on file    Gets together: Not on file    Attends religious service: Not on file    Active member of club or organization: Not on file    Attends meetings of clubs or organizations: Not on file    Relationship status: Not on file  Other Topics Concern  . Not on file  Social History Narrative   Divorced. Education: [employer]. Exercise: Yes.    Family History  Problem Relation Age of Onset  . Cancer Mother        2014 cervical cancer  . Diabetes Mother   . Hyperlipidemia Mother   . Diabetes Brother   . Hyperlipidemia Brother   . Kidney disease Brother   . Colon cancer Neg Hx   . Esophageal cancer Neg Hx   . Stomach cancer Neg Hx   . Rectal cancer Neg Hx      ROS Review of Systems See HPI Constitution: No fevers or chills No malaise No diaphoresis Skin: No rash or  itching Eyes: no blurry vision, no double vision GU: no dysuria or hematuria Neuro: no dizziness or headaches  all others reviewed and negative   Objective: Vitals:   10/07/17 1533  BP: 136/88  Pulse: 89  Resp: 17  Temp: 98.4 F (36.9 C)  TempSrc: Oral  SpO2: 96%  Weight: 173 lb (78.5 kg)  Height: 5\' 5"  (1.651 m)  Body mass index is 28.79 kg/m.   Physical Exam  Physical Exam  Constitutional: She is oriented to person, place, and time. She appears well-developed and well-nourished.  HENT:  Head: Normocephalic and atraumatic.  Eyes: Conjunctivae and EOM are normal.  Cardiovascular: Normal rate, regular rhythm and normal heart sounds.   Pulmonary/Chest: Effort normal and breath sounds normal. No respiratory distress. She has no wheezes.  Abdominal: Normal appearance and bowel sounds are normal. There is no tenderness. There is no CVA tenderness.  Neurological: She is alert and oriented to person, place, and time.    Assessment and Plan Krystal Gibson was seen today for medication refill.  Diagnoses and all orders for this visit:  Irritable bowel syndrome with both constipation and diarrhea- continue dietary restrictions, smoking cessation and prn meds  Essential hypertension- bp in good range, cpm Had labs done at Cardiology  Tobacco use disorder- discussed smoking cessation and tools for success  Mixed hyperlipidemia- discussed statin Will get labs from [place]

## 2017-10-07 NOTE — Patient Instructions (Addendum)
     IF you received an x-ray today, you will receive an invoice from [facility]. Please contact [facility] at [phone] with questions or concerns regarding your invoice.   IF you received labwork today, you will receive an invoice from [facility]. Please contact [facility] at [phone] with questions or concerns regarding your invoice.   Our billing staff will not be able to assist you with questions regarding bills from these companies.  You will be contacted with the lab results as soon as they are available. The fastest way to get your results is to activate your My Chart account. Instructions are located on the last page of this paperwork. If you have not heard from [place] regarding the results in 2 weeks, please contact this office.     Cholesterol Cholesterol is a fat. Your body needs a small amount of cholesterol. Cholesterol (plaque) may build up in your blood vessels (arteries). That makes you more likely to have a heart attack or stroke. You cannot feel your cholesterol level. Having a blood test is the only way to find out if your level is high. Keep your test results. Work with your doctor to keep your cholesterol at a good level. What do the results mean?  Total cholesterol is how much cholesterol is in your blood.  LDL is bad cholesterol. This is the type that can build up. Try to have low LDL.  HDL is good cholesterol. It cleans your blood vessels and carries LDL away. Try to have high HDL.  Triglycerides are fat that the body can store or burn for energy. What are good levels of cholesterol?  Total cholesterol below 200.  LDL below 100 is good for people who have health risks. LDL below 70 is good for people who have very high risks.  HDL above 40 is good. It is best to have HDL of 60 or higher.  Triglycerides below 150. How can I lower my cholesterol? Diet Follow your diet program as told by your doctor.  Choose fish, white meat chicken, or  [place] that is roasted or baked. Try not to eat red meat, fried foods, sausage, or lunch meats.  Eat lots of fresh fruits and vegetables.  Choose whole grains, beans, pasta, potatoes, and cereals.  Choose olive oil, corn oil, or canola oil. Only use small amounts.  Try not to eat butter, mayonnaise, shortening, or palm kernel oils.  Try not to eat foods with trans fats.  Choose low-fat or nonfat dairy foods. ? Drink skim or nonfat milk. ? Eat low-fat or nonfat yogurt and cheeses. ? Try not to drink whole milk or cream. ? Try not to eat ice cream, egg yolks, or full-fat cheeses.  Healthy desserts include angel food cake, ginger snaps, animal crackers, hard candy, popsicles, and low-fat or nonfat frozen yogurt. Try not to eat pastries, cakes, pies, and cookies.  Exercise Follow your exercise program as told by your doctor.  Be more active. Try gardening, walking, and taking the stairs.  Ask your doctor about ways that you can be more active.  Medicine  Take over-the-counter and prescription medicines only as told by your doctor. This information is not intended to replace advice given to you by your health care provider. Make sure you discuss any questions you have with your health care provider. Document Released: 07/06/2008 Document Revised: 11/09/2015 Document Reviewed: 10/20/2015 Elsevier Interactive Patient Education  [employer].

## 2017-12-06 ENCOUNTER — Telehealth: Payer: Self-pay | Admitting: Family Medicine

## 2017-12-06 NOTE — Telephone Encounter (Signed)
Called patient about her appt she has with Dr. Nolon Rod on 04/09/2018. The provider will not be in the office on that day and would have to be rescheduled. The patient did not have a dpr on file so did not leave a voicemail.

## 2018-03-03 ENCOUNTER — Other Ambulatory Visit: Payer: Self-pay | Admitting: Family Medicine

## 2018-03-03 DIAGNOSIS — Z1231 Encounter for screening mammogram for malignant neoplasm of breast: Secondary | ICD-10-CM

## 2018-03-31 ENCOUNTER — Telehealth: Payer: Self-pay | Admitting: Family Medicine

## 2018-03-31 NOTE — Telephone Encounter (Signed)
Spoke with pt and was able to change the pts appt from 04/11/18 to 04/07/18 due to provider schedule change. I advised pt of time, building and late policy. I advised pt that she could come in on her lunch hour to do fasting labs due to the fact that she has to be at work at 8:00 am and the lab would not be ready for her at 8:00. Pt acknowledged.

## 2018-04-07 ENCOUNTER — Encounter: Payer: Self-pay | Admitting: Family Medicine

## 2018-04-07 ENCOUNTER — Other Ambulatory Visit: Payer: Self-pay

## 2018-04-07 ENCOUNTER — Ambulatory Visit (INDEPENDENT_AMBULATORY_CARE_PROVIDER_SITE_OTHER): Payer: BLUE CROSS/BLUE SHIELD | Admitting: Family Medicine

## 2018-04-07 VITALS — BP 134/86 | HR 75 | Temp 98.5°F | Resp 17 | Ht 65.0 in | Wt 179.0 lb

## 2018-04-07 DIAGNOSIS — I10 Essential (primary) hypertension: Secondary | ICD-10-CM | POA: Diagnosis not present

## 2018-04-07 DIAGNOSIS — K5732 Diverticulitis of large intestine without perforation or abscess without bleeding: Secondary | ICD-10-CM

## 2018-04-07 DIAGNOSIS — Z1159 Encounter for screening for other viral diseases: Secondary | ICD-10-CM

## 2018-04-07 DIAGNOSIS — E782 Mixed hyperlipidemia: Secondary | ICD-10-CM | POA: Diagnosis not present

## 2018-04-07 DIAGNOSIS — F17200 Nicotine dependence, unspecified, uncomplicated: Secondary | ICD-10-CM

## 2018-04-07 DIAGNOSIS — K5730 Diverticulosis of large intestine without perforation or abscess without bleeding: Secondary | ICD-10-CM

## 2018-04-07 NOTE — Progress Notes (Signed)
Established Patient Office Visit  Subjective:  Patient ID: Krystal Gibson, female    DOB: [birth year]  Age: 62 y.o. MRN: [id number]  CC:  Chief Complaint  Patient presents with  . Hyperlipidemia    follow up.  Pt lab results need to be sent to [facility] at [facility]  . Medication Refill    lisinopril, atorvastatin    HPI Krystal Gibson presents for     Dyslipidemia: Patient presents for evaluation of lipids.  Compliance with treatment thus far has been good.  A repeat fasting lipid profile was done.  The patient does not use medications that may worsen dyslipidemias (corticosteroids, progestins, anabolic steroids, diuretics, beta-blockers, amiodarone, cyclosporine, olanzapine). The patient exercises around the house.   The 10-year ASCVD risk score Krystal Gibson DC Brooke Bonito., et al., 2013) is: 24.9%   Values used to calculate the score:     Age: 56 years     Sex: Female     Is Non-Hispanic African American: Yes     Diabetic: No     Tobacco smoker: Yes     Systolic Blood Pressure: 160 mmHg     Is BP treated: Yes     HDL Cholesterol: 41 mg/dL     Total Cholesterol: 262 mg/dL   Hypertension: Patient here for follow-up of elevated blood pressure. She is not exercising doing cardio but does house work and takes care of her grandkids and is very active and is adherent to low salt diet.  Blood pressure is well controlled at home. Cardiac symptoms chest pressure/discomfort. Patient denies chest pain, claudication, exertional chest pressure/discomfort, fatigue, irregular heart beat and near-syncope.  Cardiovascular risk factors: hypertension. Use of agents associated with hypertension: NSAIDS. She takes ibuprofen when she gets low back pains.   BP Readings from Last 3 Encounters:  04/07/18 134/86  10/07/17 136/88  05/07/17 (!) 143/89    Patient sees Dr. Vernell Leep is her Cardiologist She has an upcoming appointment 04/11/18 She states that she is on  lisinopril-hctz 10-12.5and atorvastatin 31m for her cholesterol   She reports that she has some chest discomfort occasionally and will be seeing Dr. PVirgina Jockthis Friday and they are discussing a stress test.  She denies chest pains today.    Diverticulosis She reports that she has less abdominal cramping now that she has changed her diet and is taking the Robinul She is having less diarrhea but still gets bloating and gas She does not take a probiotic She denies blood per rectum Her last cscopy was 04/2017  Tobacco use This patient is a chronic smoker. She admits that she no longer smokes in the house. She only smokes 2-3 cigarettes a day. She has a plan to quit [place] She does not think he needs meds. Her family have also quit smoking. She plans to just not buy anymore.    Past Medical History:  Diagnosis Date  . Allergy   . Anxiety   . Arthritis   . Colitis   . Colon polyp   . Depression   . Diverticulitis   . Diverticulosis   . DJD (degenerative joint disease)   . Hyperlipidemia   . Hypertension   . IBS (irritable bowel syndrome)   . Tension headache     Past Surgical History:  Procedure Laterality Date  . ABDOMINAL HYSTERECTOMY    . BREAST ENHANCEMENT SURGERY    . BREAST SURGERY    . CESAREAN SECTION    . COLONOSCOPY    .  ORTHOPEDIC SURGERY    . REDUCTION MAMMAPLASTY Bilateral    1980  . SHOULDER SURGERY    . TUBAL LIGATION    . UPPER GASTROINTESTINAL ENDOSCOPY      Family History  Problem Relation Age of Onset  . Cancer Mother        2014 cervical cancer  . Diabetes Mother   . Hyperlipidemia Mother   . Diabetes Brother   . Hyperlipidemia Brother   . Kidney disease Brother   . Colon cancer Neg Hx   . Esophageal cancer Neg Hx   . Stomach cancer Neg Hx   . Rectal cancer Neg Hx     Social History   Socioeconomic History  . Marital status: Single    Spouse name: Not on file  . Number of children: 2  . Years of education: Not on file  .  Highest education level: Not on file  Occupational History  . Occupation: semi-retired  Social Needs  . Financial resource strain: Not on file  . Food insecurity:    Worry: Not on file    Inability: Not on file  . Transportation needs:    Medical: Not on file    Non-medical: Not on file  Tobacco Use  . Smoking status: Current Every Day Smoker    Packs/day: 0.50    Types: Cigarettes    Last attempt to quit: 02/22/2011    Years since quitting: 7.1  . Smokeless tobacco: Never Used  . Tobacco comment: havent had a cigarette in three weeks  Substance and Sexual Activity  . Alcohol use: No    Comment: stopped 1990  . Drug use: No    Comment: stopped 5 yr ago  . Sexual activity: Not on file  Lifestyle  . Physical activity:    Days per week: Not on file    Minutes per session: Not on file  . Stress: Not on file  Relationships  . Social connections:    Talks on phone: Not on file    Gets together: Not on file    Attends religious service: Not on file    Active member of club or organization: Not on file    Attends meetings of clubs or organizations: Not on file    Relationship status: Not on file  . Intimate partner violence:    Fear of current or ex partner: Not on file    Emotionally abused: Not on file    Physically abused: Not on file    Forced sexual activity: Not on file  Other Topics Concern  . Not on file  Social History Narrative   Divorced. Education: [employer]. Exercise: Yes.    Outpatient Medications Prior to Visit  Medication Sig Dispense Refill  . atorvastatin (LIPITOR) 40 MG tablet Take 0.5 tablets (20 mg total) by mouth daily at 6 PM. 90 tablet 1  . cholecalciferol (VITAMIN D) 1000 UNITS tablet Take 1,000 Units by mouth daily.    Marland Kitchen desloratadine (CLARINEX) 5 MG tablet Take 1 tablet (5 mg total) by mouth daily. (Patient taking differently: Take 5 mg by mouth as needed. ) 30 tablet 5  . glycopyrrolate (ROBINUL) 2 MG tablet Take 1 tablet (2 mg total) by mouth 2  (two) times daily. 60 tablet 11  . lisinopril-hydrochlorothiazide (PRINZIDE,ZESTORETIC) 10-12.5 MG tablet TAKE 1 TABLET BY MOUTH DAILY 30 tablet 3   Facility-Administered Medications Prior to Visit  Medication Dose Route Frequency Provider Last Rate Last Dose  . 0.9 %  sodium chloride infusion  500 mL Intravenous Once Ladene Artist, MD        Allergies  Allergen Reactions  . Sulfa Antibiotics Swelling    Eyes swollen and redness    ROS Review of Systems  Neurological: Negative for dizziness, seizures and numbness.   Review of Systems  Constitutional: Negative for activity change, appetite change, chills and fever.  HENT: Negative for congestion, nosebleeds, trouble swallowing and voice change.   Respiratory: Negative for cough, shortness of breath and wheezing.   Gastrointestinal: Negative for diarrhea, nausea and vomiting.  Genitourinary: Negative for difficulty urinating, dysuria, flank pain and hematuria.  Musculoskeletal: Negative for back pain, joint swelling and neck pain.  Neurological: Negative for dizziness, speech difficulty, light-headedness and numbness.  See HPI. All other review of systems negative.     Objective:  BP 134/86 (BP Location: Right Arm, Patient Position: Sitting, Cuff Size: Normal)   Pulse 75   Temp 98.5 F (36.9 C) (Oral)   Resp 17   Ht '5\' 5"'  (1.651 m)   Wt 179 lb (81.2 kg)   SpO2 97%   BMI 29.79 kg/m  Wt Readings from Last 3 Encounters:  04/07/18 179 lb (81.2 kg)  10/07/17 173 lb (78.5 kg)  05/07/17 172 lb (78 kg)     Physical Exam  Constitutional: She is oriented to person, place, and time. She appears well-developed and well-nourished.  HENT:  Head: Normocephalic and atraumatic.  Eyes: Conjunctivae and EOM are normal.  Neck: Normal range of motion. Neck supple. No thyromegaly present.  Cardiovascular: Normal rate, regular rhythm and normal heart sounds.  No murmur heard. Pulmonary/Chest: Effort normal and breath sounds normal.  No respiratory distress. She has no wheezes. She has no rales.  Abdominal: Soft. Bowel sounds are normal. She exhibits no distension. There is no abdominal tenderness. There is no rebound.  Neurological: She is alert and oriented to person, place, and time. She has normal reflexes.  Skin: Skin is warm. No rash noted.  Psychiatric: She has a normal mood and affect. Her behavior is normal. Judgment and thought content normal.      Health Maintenance Due  Topic Date Due  . Hepatitis C Screening  December 07, 1955  . HIV Screening  12/04/1968    There are no preventive care reminders to display for this patient.  Lab Results  Component Value Date   TSH 1.255 01/08/2012   Lab Results  Component Value Date   WBC 7.0 07/30/2013   HGB 14.6 07/30/2013   HCT 41.3 07/30/2013   MCV 84.6 07/30/2013   PLT 300 07/30/2013   Lab Results  Component Value Date   NA 143 04/07/2018   K 3.6 04/07/2018   CO2 24 04/07/2018   GLUCOSE 97 04/07/2018   BUN 14 04/07/2018   CREATININE 0.72 04/07/2018   BILITOT 0.9 04/07/2018   ALKPHOS 125 (H) 04/07/2018   AST 17 04/07/2018   ALT 20 04/07/2018   PROT 7.1 04/07/2018   ALBUMIN 4.3 04/07/2018   CALCIUM 9.4 04/07/2018   Lab Results  Component Value Date   CHOL 262 (H) 04/07/2018   Lab Results  Component Value Date   HDL 41 04/07/2018   Lab Results  Component Value Date   LDLCALC 201 (H) 04/07/2018   Lab Results  Component Value Date   TRIG 102 04/07/2018   Lab Results  Component Value Date   CHOLHDL 6.4 (H) 04/07/2018   Lab Results  Component Value Date   HGBA1C 5.8 (H) 04/07/2018  Assessment & Plan:   Problem List Items Addressed This Visit      Cardiovascular and Mediastinum   Essential hypertension - Primary    Patient's blood pressure is at goal of 139/89 or less. Condition is stable. Continue current medications and treatment plan. I recommend that you exercise for 30-45 minutes 5 days a week. I also recommend a balanced  diet with fruits and vegetables every day, lean meats, and little fried foods. The DASH diet (you can find this online) is a good example of this.       Relevant Orders   Hemoglobin A1c (Completed)   Lipid panel (Completed)   CMP14+EGFR (Completed)     Other   DIVERTICULITIS OF COLON    Discussed probiotic  Pt will follow up with GI      Mixed hyperlipidemia    Discussed lipid mgmt      Relevant Orders   Hemoglobin A1c (Completed)   Lipid panel (Completed)   CMP14+EGFR (Completed)   Tobacco use disorder    Discussed cold [place] quitting and quitting with zyban to help with irritability and cut cravings       Other Visit Diagnoses    Encounter for hepatitis C screening test for low risk patient       Relevant Orders   HCV Ab w/Rflx to Verification (Completed)   Diverticulosis of colon without hemorrhage          No orders of the defined types were placed in this encounter.   Follow-up: No follow-ups on file.    Forrest Moron, MD

## 2018-04-07 NOTE — Patient Instructions (Addendum)
Align probiotic helps with gas as well as bowel movements    If you have lab work done today you will be contacted with your lab results within the next 2 weeks.  If you have not heard from [place] then please contact us. The fastest way to get your results is to register for My Chart.   IF you received an x-ray today, you will receive an invoice from [facility]. Please contact [facility] at [phone] with questions or concerns regarding your invoice.   IF you received labwork today, you will receive an invoice from [facility]. Please contact [facility] at [phone] with questions or concerns regarding your invoice.   Our billing staff will not be able to assist you with questions regarding bills from these companies.  You will be contacted with the lab results as soon as they are available. The fastest way to get your results is to activate your My Chart account. Instructions are located on the last page of this paperwork. If you have not heard from [place] regarding the results in 2 weeks, please contact this office.     Coping with Quitting Smoking Quitting smoking is a physical and mental challenge. You will face cravings, withdrawal symptoms, and temptation. Before quitting, work with your health care provider to make a plan that can help you cope. Preparation can help you quit and keep you from giving in. How can I cope with cravings? Cravings usually last for 5-10 minutes. If you get through it, the craving will pass. Consider taking the following actions to help you cope with cravings:  Keep your mouth busy: ? Chew sugar-free gum. ? Suck on hard candies or a straw. ? Brush your teeth.  Keep your hands and body busy: ? Immediately change to a different activity when you feel a craving. ? Squeeze or play with a ball. ? Do an activity or a hobby, like making bead jewelry, practicing needlepoint, or working with wood. ? Mix up your normal routine. ? Take a short  exercise break. Go for a quick walk or run up and down stairs. ? Spend time in public places where smoking is not allowed.  Focus on doing something kind or helpful for someone else.  Call a friend or family member to talk during a craving.  Join a support group.  Call a quit line, such as 1-800-QUIT-NOW.  Talk with your health care provider about medicines that might help you cope with cravings and make quitting easier for you.  How can I deal with withdrawal symptoms? Your body may experience negative effects as it tries to get used to not having nicotine in the system. These effects are called withdrawal symptoms. They may include:  Feeling hungrier than normal.  Trouble concentrating.  Irritability.  Trouble sleeping.  Feeling depressed.  Restlessness and agitation.  Craving a cigarette.  To manage withdrawal symptoms:  Avoid places, people, and activities that trigger your cravings.  Remember why you want to quit.  Get plenty of sleep.  Avoid coffee and other caffeinated drinks. These may worsen some of your symptoms.  How can I handle social situations? Social situations can be difficult when you are quitting smoking, especially in the first few weeks. To manage this, you can:  Avoid parties, bars, and other social situations where people might be smoking.  Avoid alcohol.  Leave right away if you have the urge to smoke.  Explain to your family and friends that you are quitting smoking. Ask for understanding  and support.  Plan activities with friends or family where smoking is not an option.  What are some ways I can cope with stress? Wanting to smoke may cause stress, and stress can make you want to smoke. Find ways to manage your stress. Relaxation techniques can help. For example:  Breathe slowly and deeply, in through your nose and out through your mouth.  Listen to soothing, relaxing music.  Talk with a family member or friend about your  stress.  Light a candle.  Soak in a bath or take a shower.  Think about a peaceful place.  What are some ways I can prevent weight gain? Be aware that many people gain weight after they quit smoking. However, not everyone does. To keep from gaining weight, have a plan in place before you quit and stick to the plan after you quit. Your plan should include:  Having healthy snacks. When you have a craving, it may help to: ? Eat plain popcorn, crunchy carrots, celery, or other cut vegetables. ? Chew sugar-free gum.  Changing how you eat: ? Eat small portion sizes at meals. ? Eat 4-6 small meals throughout the day instead of 1-2 large meals a day. ? Be mindful when you eat. Do not watch television or do other things that might distract you as you eat.  Exercising regularly: ? Make time to exercise each day. If you do not have time for a long workout, do short bouts of exercise for 5-10 minutes several times a day. ? Do some form of strengthening exercise, like weight lifting, and some form of aerobic exercise, like running or swimming.  Drinking plenty of water or other low-calorie or no-calorie drinks. Drink 6-8 glasses of water daily, or as much as instructed by your health care provider.  Summary  Quitting smoking is a physical and mental challenge. You will face cravings, withdrawal symptoms, and temptation to smoke again. Preparation can help you as you go through these challenges.  You can cope with cravings by keeping your mouth busy (such as by chewing gum), keeping your body and hands busy, and making calls to family, friends, or a helpline for people who want to quit smoking.  You can cope with withdrawal symptoms by avoiding places where people smoke, avoiding drinks with caffeine, and getting plenty of rest.  Ask your health care provider about the different ways to prevent weight gain, avoid stress, and handle social situations. This information is not intended to replace  advice given to you by your health care provider. Make sure you discuss any questions you have with your health care provider. Document Released: 04/06/2016 Document Revised: 04/06/2016 Document Reviewed: 04/06/2016 Elsevier Interactive Patient Education  [employer].

## 2018-04-08 ENCOUNTER — Encounter: Payer: Self-pay | Admitting: Family Medicine

## 2018-04-08 LAB — CMP14+EGFR
ALT: 20 IU/L (ref 0–32)
AST: 17 IU/L (ref 0–40)
Albumin/Globulin Ratio: 1.5 (ref 1.2–2.2)
Albumin: 4.3 g/dL (ref 3.6–4.8)
Alkaline Phosphatase: 125 IU/L — ABNORMAL HIGH (ref 39–117)
BUN/Creatinine Ratio: 19 (ref 12–28)
BUN: 14 mg/dL (ref 8–27)
Bilirubin Total: 0.9 mg/dL (ref 0.0–1.2)
CO2: 24 mmol/L (ref 20–29)
Calcium: 9.4 mg/dL (ref 8.7–10.3)
Chloride: 102 mmol/L (ref 96–106)
Creatinine, Ser: 0.72 mg/dL (ref 0.57–1.00)
GFR calc Af Amer: 102 mL/min/{1.73_m2} (ref >59)
GFR calc non Af Amer: 89 mL/min/{1.73_m2} (ref >59)
GLOBULIN, TOTAL: 2.8 g/dL (ref 1.5–4.5)
Glucose: 97 mg/dL (ref 65–99)
Potassium: 3.6 mmol/L (ref 3.5–5.2)
Sodium: 143 mmol/L (ref 134–144)
Total Protein: 7.1 g/dL (ref 6.0–8.5)

## 2018-04-08 LAB — HEMOGLOBIN A1C
Est. average glucose Bld gHb Est-mCnc: 120 mg/dL
HEMOGLOBIN A1C: 5.8 % — AB (ref 4.8–5.6)

## 2018-04-08 LAB — LIPID PANEL
Chol/HDL Ratio: 6.4 ratio — ABNORMAL HIGH (ref 0.0–4.4)
Cholesterol, Total: 262 mg/dL — ABNORMAL HIGH (ref 100–199)
HDL: 41 mg/dL (ref >39)
LDL CALC: 201 mg/dL — AB (ref 0–99)
TRIGLYCERIDES: 102 mg/dL (ref 0–149)
VLDL Cholesterol Cal: 20 mg/dL (ref 5–40)

## 2018-04-08 LAB — HCV INTERPRETATION

## 2018-04-08 LAB — HCV AB W/RFLX TO VERIFICATION: HCV Ab: <0.1 s/co ratio (ref 0.0–0.9)

## 2018-04-08 NOTE — Assessment & Plan Note (Signed)
Discussed probiotic  Pt will follow up with GI

## 2018-04-08 NOTE — Assessment & Plan Note (Signed)
Patient's blood pressure is at goal of 139/89 or less. Condition is stable. Continue current medications and treatment plan. I recommend that you exercise for 30-45 minutes 5 days a week. I also recommend a balanced diet with fruits and vegetables every day, lean meats, and little fried foods. The DASH diet (you can find this online) is a good example of this.  

## 2018-04-08 NOTE — Assessment & Plan Note (Signed)
Discussed cold [place] quitting and quitting with zyban to help with irritability and cut cravings

## 2018-04-08 NOTE — Assessment & Plan Note (Signed)
Discussed lipid mgmt

## 2018-04-09 ENCOUNTER — Ambulatory Visit: Payer: BLUE CROSS/BLUE SHIELD | Admitting: Family Medicine

## 2018-04-09 ENCOUNTER — Other Ambulatory Visit: Payer: Self-pay | Admitting: Family Medicine

## 2018-04-09 MED ORDER — LISINOPRIL-HYDROCHLOROTHIAZIDE 10-12.5 MG PO TABS: 1.0000 | ORAL_TABLET | Freq: Every day | ORAL | 1 refills | Status: DC

## 2018-04-09 MED ORDER — ATORVASTATIN CALCIUM 40 MG PO TABS: 20.0000 mg | ORAL_TABLET | Freq: Every day | ORAL | 1 refills | Status: DC

## 2018-04-09 NOTE — Telephone Encounter (Signed)
Copied from [place] #[id number]. Topic: Quick Communication - Rx Refill/Question >> Apr 09, 2018  4:20 PM [place], Sade R wrote: Medication: atorvastatin (LIPITOR) 40 MG tablet, lisinopril-hydrochlorothiazide (PRINZIDE,ZESTORETIC) 10-12.5 MG tablet Has the patient contacted their pharmacy? Yes  Preferred Pharmacy (with phone number or street name): [facility], [place] - [address] [phone] (Phone) [phone] (Fax)    Agent: Please be advised that RX refills may take up to 3 business days. We ask that you follow-up with your pharmacy.

## 2018-04-11 ENCOUNTER — Ambulatory Visit: Payer: BLUE CROSS/BLUE SHIELD | Admitting: Family Medicine

## 2018-04-24 ENCOUNTER — Ambulatory Visit
Admission: RE | Admit: 2018-04-24 | Discharge: 2018-04-24 | Disposition: A | Discharge status: Home / Self Care | Payer: BLUE CROSS/BLUE SHIELD | Source: Ambulatory Visit | Attending: Family Medicine | Admitting: Family Medicine

## 2018-04-24 DIAGNOSIS — Z1231 Encounter for screening mammogram for malignant neoplasm of breast: Secondary | ICD-10-CM

## 2018-05-30 ENCOUNTER — Ambulatory Visit: Payer: Self-pay | Admitting: Cardiology

## 2018-06-10 DIAGNOSIS — E1165 Type 2 diabetes mellitus with hyperglycemia: Secondary | ICD-10-CM | POA: Insufficient documentation

## 2018-06-10 DIAGNOSIS — E119 Type 2 diabetes mellitus without complications: Secondary | ICD-10-CM | POA: Insufficient documentation

## 2018-06-10 DIAGNOSIS — G4730 Sleep apnea, unspecified: Secondary | ICD-10-CM | POA: Insufficient documentation

## 2018-06-10 DIAGNOSIS — R0600 Dyspnea, unspecified: Secondary | ICD-10-CM | POA: Insufficient documentation

## 2018-06-10 DIAGNOSIS — I420 Dilated cardiomyopathy: Secondary | ICD-10-CM | POA: Insufficient documentation

## 2018-06-10 DIAGNOSIS — R0609 Other forms of dyspnea: Secondary | ICD-10-CM | POA: Insufficient documentation

## 2018-06-10 DIAGNOSIS — R600 Localized edema: Secondary | ICD-10-CM | POA: Insufficient documentation

## 2018-06-10 DIAGNOSIS — IMO0002 Reserved for concepts with insufficient information to code with codable children: Secondary | ICD-10-CM | POA: Insufficient documentation

## 2018-06-10 NOTE — Progress Notes (Signed)
Patient is here for follow up visit.  Subjective:   '@Patient'  ID: Krystal Gibson, female    DOB: [birth year], 63 y.o.   MRN: [id number]  Chief Complaint  Patient presents with  . Hypertension    F/U    HPI  63 year old African American female with hypertension, hyperlipidemia, irritable bowel syndrome.  At last visit, suspecting familial hypercholesterolemia, I had recommedned PCSK9 inhibitor, in addition to statin. However, patient was reluctant to use needles. I recommend increasing lipitor to 80 mg daily, adding Zetia 10 mg daily, and repeat lipid panel in 3 months. Patient underwent exercise treadmill stress test due to atypical chest pain, which was submaximal due to poor exercsie tolerance.   Patient has not had any recurrence of her chest pain. She continues to have variations in her blood pressure, most readings >150/80 mmHg.   Past Medical History:  Diagnosis Date  . Allergy   . Anxiety   . Arthritis   . Colitis   . Colon polyp   . Depression   . Diverticulitis   . Diverticulosis   . DJD (degenerative joint disease)   . Hyperlipidemia   . Hypertension   . IBS (irritable bowel syndrome)   . Tension headache     Past Surgical History:  Procedure Laterality Date  . ABDOMINAL HYSTERECTOMY    . BREAST ENHANCEMENT SURGERY    . BREAST EXCISIONAL BIOPSY    . BREAST SURGERY    . CESAREAN SECTION    . COLONOSCOPY    . ORTHOPEDIC SURGERY    . REDUCTION MAMMAPLASTY Bilateral    1980  . SHOULDER SURGERY    . TUBAL LIGATION    . UPPER GASTROINTESTINAL ENDOSCOPY      Social History   Socioeconomic History  . Marital status: Single    Spouse name: Not on file  . Number of children: 2  . Years of education: Not on file  . Highest education level: Not on file  Occupational History  . Occupation: semi-retired  Social Needs  . Financial resource strain: Not on file  . Food insecurity:    Worry: Not on file    Inability: Not on file  . Transportation  needs:    Medical: Not on file    Non-medical: Not on file  Tobacco Use  . Smoking status: Current Every Day Smoker    Packs/day: 0.50    Types: Cigarettes    Last attempt to quit: 02/22/2011    Years since quitting: 7.3  . Smokeless tobacco: Never Used  . Tobacco comment: havent had a cigarette in three weeks  Substance and Sexual Activity  . Alcohol use: No    Comment: stopped 1990  . Drug use: No    Comment: stopped 5 yr ago  . Sexual activity: Not on file  Lifestyle  . Physical activity:    Days per week: Not on file    Minutes per session: Not on file  . Stress: Not on file  Relationships  . Social connections:    Talks on phone: Not on file    Gets together: Not on file    Attends religious service: Not on file    Active member of club or organization: Not on file    Attends meetings of clubs or organizations: Not on file    Relationship status: Not on file  . Intimate partner violence:    Fear of current or ex partner: Not on file    Emotionally abused: Not  on file    Physically abused: Not on file    Forced sexual activity: Not on file  Other Topics Concern  . Not on file  Social History Narrative   Divorced. Education: [employer]. Exercise: Yes.    Current Outpatient Medications on File Prior to Visit  Medication Sig Dispense Refill  . atorvastatin (LIPITOR) 40 MG tablet Take 0.5 tablets (20 mg total) by mouth daily at 6 PM. 90 tablet 1  . cholecalciferol (VITAMIN D) 1000 UNITS tablet Take 1,000 Units by mouth daily.    Marland Kitchen desloratadine (CLARINEX) 5 MG tablet Take 1 tablet (5 mg total) by mouth daily. (Patient taking differently: Take 5 mg by mouth as needed. ) 30 tablet 5  . glycopyrrolate (ROBINUL) 2 MG tablet Take 1 tablet (2 mg total) by mouth 2 (two) times daily. 60 tablet 11  . lisinopril-hydrochlorothiazide (PRINZIDE,ZESTORETIC) 10-12.5 MG tablet Take 1 tablet by mouth daily. 90 tablet 1   Current Facility-Administered Medications on File Prior to Visit   Medication Dose Route Frequency Provider Last Rate Last Dose  . 0.9 %  sodium chloride infusion  500 mL Intravenous Once Ladene Artist, MD        Cardiovascular studies:   Exercise Treadmill Stress Test 05/09/2018:  Indication: CP  The patient exercised on Bruce protocol for 5:21 min. Patient achieved 6.37 METS and reached HR 115 bpm, which is 73% of maximum age-predicted HR. Stress test terminated due to Leg discomfort and fatigue.  Exercise capacity was below average for age. HR Response to Exercise: Suboptimal due to low exercise capacity.  BP Response to Exercise: Normal resting BP- appropriate response. Chest Pain: none. Arrhythmias: none.  Resting EKG demonstrates Normal sinus rhythm. ST Changes: With peak exercise there was no ST-T changes of ischemia. However, this is submaximal stress test due to poor exercise tolerance.   Overall Impression: Inconclusive, submaximal stress test due to low exercise capacity.  Recommendation: Consider alternate ischemia testing, if clincial sucpision of CAD is high.   Echocardiogram 04/11/2018: Left ventricle cavity is normal in size. Moderate concentric hypertrophy of the left ventricle. Normal global wall motion. Doppler evidence of grade I (impaired) diastolic dysfunction, normal LAP. Calculated EF 63%. No significant valvular abnormality. Inadequate TR jet to estimate PA systolic pressure. Normal right atrial pressure.  Labs 04/08/2018: Glucose 97. BUN/Cr 14/0.72. eGFR 89. Na/K 143/3.6. AlKP 125 mildly elevated. Rest of the CMP normal.  Chol 262, TG 102, HDL 41, LDL 201 HbAC 5.8%  Review of Systems  Constitution: Negative for decreased appetite, malaise/fatigue, weight gain and weight loss.  HENT: Negative for congestion.   Eyes: Negative for visual disturbance.  Cardiovascular: Negative for chest pain, dyspnea on exertion, leg swelling, palpitations and syncope.  Respiratory: Negative for shortness of breath.    Endocrine: Negative for cold intolerance.  Hematologic/Lymphatic: Does not bruise/bleed easily.  Skin: Negative for itching and rash.  Musculoskeletal: Negative for myalgias.  Gastrointestinal: Negative for abdominal pain, nausea and vomiting.  Genitourinary: Negative for dysuria.  Neurological: Negative for dizziness and weakness.  Psychiatric/Behavioral: The patient is not nervous/anxious.   All other systems reviewed and are negative.      Objective:   Vitals:   06/11/18 1557  BP: (!) 146/79  Pulse: 79  SpO2: 97%     Physical Exam  Constitutional: She is oriented to person, place, and time. She appears well-developed and well-nourished. No distress.  HENT:  Head: Normocephalic and atraumatic.  Eyes: Pupils are equal, round, and reactive to light. Conjunctivae  are normal.  Neck: No JVD present.  Cardiovascular: Normal rate, regular rhythm and intact distal pulses.  No murmur heard. Pulses:      Carotid pulses are on the right side with bruit and on the left side with bruit. Pulmonary/Chest: Effort normal and breath sounds normal. She has no wheezes. She has no rales.  Abdominal: Soft. Bowel sounds are normal. There is no rebound.  Musculoskeletal:        General: No edema.  Lymphadenopathy:    She has no cervical adenopathy.  Neurological: She is alert and oriented to person, place, and time. No cranial nerve deficit.  Skin: Skin is warm and dry.  Psychiatric: She has a normal mood and affect.  Nursing note and vitals reviewed.       Assessment & Recommendations:   63 year old [name] female with hypertension, hyperlipidemia, irritable bowel syndrome.  Hypertension: Suboptimal control. Continue lisinopril-HCTZ 10-12.5 mg daily. Added amlodipine 5 mg daily.  Hyperlipidemia: Intolerant to lipitor. Added Crestor 20 mg daily.   Chest pain: Currently resolved. <onitor for now.   Follow up visit after repeat lipid oanel in 3 months.   Nigel Mormon, MD [facility] Pager: [phone] Office: [phone] If no answer Cell [phone]

## 2018-06-11 ENCOUNTER — Ambulatory Visit (INDEPENDENT_AMBULATORY_CARE_PROVIDER_SITE_OTHER): Payer: BLUE CROSS/BLUE SHIELD | Admitting: Cardiology

## 2018-06-11 ENCOUNTER — Encounter: Payer: Self-pay | Admitting: Cardiology

## 2018-06-11 VITALS — BP 146/79 | HR 79 | Ht 66.0 in | Wt 179.1 lb

## 2018-06-11 DIAGNOSIS — E782 Mixed hyperlipidemia: Secondary | ICD-10-CM

## 2018-06-11 DIAGNOSIS — I10 Essential (primary) hypertension: Secondary | ICD-10-CM | POA: Diagnosis not present

## 2018-06-11 DIAGNOSIS — R600 Localized edema: Secondary | ICD-10-CM

## 2018-06-11 DIAGNOSIS — R0600 Dyspnea, unspecified: Secondary | ICD-10-CM

## 2018-06-11 DIAGNOSIS — R0609 Other forms of dyspnea: Secondary | ICD-10-CM

## 2018-06-11 DIAGNOSIS — E1169 Type 2 diabetes mellitus with other specified complication: Secondary | ICD-10-CM

## 2018-06-11 MED ORDER — AMLODIPINE BESYLATE 5 MG PO TABS: 5.0000 mg | ORAL_TABLET | Freq: Every day | ORAL | 3 refills | Status: DC

## 2018-06-11 MED ORDER — ROSUVASTATIN CALCIUM 20 MG PO TABS: 20.0000 mg | ORAL_TABLET | Freq: Every day | ORAL | 3 refills | Status: DC

## 2018-06-12 ENCOUNTER — Encounter: Payer: Self-pay | Admitting: Cardiology

## 2018-09-04 ENCOUNTER — Other Ambulatory Visit (HOSPITAL_COMMUNITY): Payer: Self-pay | Admitting: Cardiology

## 2018-09-05 LAB — LIPID PANEL W/O CHOL/HDL RATIO
Cholesterol, Total: 203 mg/dL — ABNORMAL HIGH (ref 100–199)
HDL: 38 mg/dL — ABNORMAL LOW (ref >39)
LDL Calculated: 144 mg/dL — ABNORMAL HIGH (ref 0–99)
Triglycerides: 104 mg/dL (ref 0–149)
VLDL Cholesterol Cal: 21 mg/dL (ref 5–40)

## 2018-09-11 ENCOUNTER — Other Ambulatory Visit: Payer: Self-pay

## 2018-09-11 ENCOUNTER — Encounter: Payer: Self-pay | Admitting: Cardiology

## 2018-09-11 ENCOUNTER — Ambulatory Visit: Payer: BLUE CROSS/BLUE SHIELD | Admitting: Cardiology

## 2018-09-11 VITALS — Ht 66.0 in | Wt 169.0 lb

## 2018-09-11 DIAGNOSIS — E782 Mixed hyperlipidemia: Secondary | ICD-10-CM

## 2018-09-11 DIAGNOSIS — I10 Essential (primary) hypertension: Secondary | ICD-10-CM

## 2018-09-11 MED ORDER — AMLODIPINE BESYLATE 5 MG PO TABS: 5.0000 mg | ORAL_TABLET | Freq: Every day | ORAL | 2 refills | Status: DC

## 2018-09-11 MED ORDER — LISINOPRIL-HYDROCHLOROTHIAZIDE 10-12.5 MG PO TABS: 1.0000 | ORAL_TABLET | Freq: Every day | ORAL | 2 refills | Status: DC

## 2018-09-11 MED ORDER — ROSUVASTATIN CALCIUM 40 MG PO TABS: 20.0000 mg | ORAL_TABLET | Freq: Every day | ORAL | 3 refills | Status: DC

## 2018-09-11 NOTE — Progress Notes (Signed)
Telephone visit note  Subjective:   SOPHEAP BASIC, female    DOB: [birth year], 63 y.o.   MRN: [id number]   I connected with the patient on 09/11/18 by a telephone call and verified that I am speaking with the correct person using two identifiers.   This visit type was conducted due to national recommendations for restrictions regarding the COVID-19 Pandemic (e.g. social distancing).  This format is felt to be most appropriate for this patient at this time.  All issues noted in this document were discussed and addressed.  No physical exam was performed (except for noted visual exam findings with Tele health visits).  The patient has consented to conduct a Tele health visit and understands insurance will be billed.   Chief complaint:  Hypertension, hyperlipidemia.    63 year old [name] female with hypertension, hyperlipidemia, irritable bowel syndrome.  Her lipid panel improved on Crestor 20 mg daily, although still remains suboptimal. She has not checked her BP in a while, as she does not have a BP monitor.   Cardiovascular studies:   Exercise Treadmill Stress Test 05/09/2018:  Indication: CP  The patient exercised on Bruce protocol for 5:21 min. Patient achieved 6.37 METS and reached HR 115 bpm, which is 73% of maximum age-predicted HR. Stress test terminated due to Leg discomfort and fatigue.  Exercise capacity was below average for age. HR Response to Exercise: Suboptimal due to low exercise capacity.  BP Response to Exercise: Normal resting BP- appropriate response. Chest Pain: none. Arrhythmias: none.  Resting EKG demonstrates Normal sinus rhythm. ST Changes: With peak exercise there was no ST-T changes of ischemia. However, this is submaximal stress test due to poor exercise tolerance.   Overall Impression: Inconclusive, submaximal stress test due to low exercise capacity.  Recommendation: Consider alternate ischemia testing, if clincial sucpision  of CAD is high.   Echocardiogram 04/11/2018: Left ventricle cavity is normal in size. Moderate concentric hypertrophy of the left ventricle. Normal global wall motion. Doppler evidence of grade I (impaired) diastolic dysfunction, normal LAP. Calculated EF 63%. No significant valvular abnormality. Inadequate TR jet to estimate PA systolic pressure. Normal right atrial pressure.  Labs 04/08/2018: Glucose 97. BUN/Cr 14/0.72. eGFR 89. Na/K 143/3.6. AlKP 125 mildly elevated. Rest of the CMP normal.  Chol 262, TG 102, HDL 41, LDL 201 HbAC 5.8%  Recent labs: Results for MIKISHA, Krystal Gibson (MRN [id number]) as of 09/11/2018 11:03  Ref. Range 04/07/2018 12:00 04/07/2018 17:06 04/24/2018 16:24 09/04/2018 07:46  Total CHOL/HDL Ratio Latest Ref Range: 0.0 - 4.4 ratio 6.4 (H)     Cholesterol, Total Latest Ref Range: 100 - 199 mg/dL 262 (H)   203 (H)  HDL Cholesterol Latest Ref Range: >39 mg/dL 41   38 (L)  LDL (calc) Latest Ref Range: 0 - 99 mg/dL 201 (H)   144 (H)  Triglycerides Latest Ref Range: 0 - 149 mg/dL 102   104  VLDL Cholesterol Cal Latest Ref Range: 5 - 40 mg/dL 20   21  Comment: Unknown Comment     Globulin, Total Latest Ref Range: 1.5 - 4.5 g/dL 2.8     Glucose Latest Ref Range: 65 - 99 mg/dL 97     Hemoglobin A1C Latest Ref Range: 4.8 - 5.6 % 5.8 (H)     Est. average glucose Bld gHb Est-mCnc Latest Units: mg/dL 120           Assessment & Recommendations:  63 year old [name] female with hypertension, hyperlipidemia, irritable bowel syndrome.  Hypertension:  Refilled lisinopril-HCTZ 10-12.5 mg daily and amlodipine 5 mg daily. Encouraged home monitoring.  Hyperlipidemia: Increased Crestor to 40 mg daily.   In office f/u visit in 2 months  Time spent: 12 min  [place], MD [facility] Pager: [phone] Office: [phone] If no answer Cell [phone]

## 2018-09-17 ENCOUNTER — Telehealth: Payer: Self-pay

## 2018-09-17 NOTE — Telephone Encounter (Signed)
Please hold for 3 days and then resume at half the dose.  Thanks MJP

## 2018-09-17 NOTE — Telephone Encounter (Signed)
Pt called stating that her cholesterol meds were increased and now she has been having constant headache that she cant get rid of she thinks that it has to do with her crestor

## 2018-11-28 ENCOUNTER — Other Ambulatory Visit: Payer: Self-pay

## 2018-11-28 DIAGNOSIS — I10 Essential (primary) hypertension: Secondary | ICD-10-CM

## 2018-11-28 MED ORDER — LISINOPRIL-HYDROCHLOROTHIAZIDE 10-12.5 MG PO TABS: 1.0000 | ORAL_TABLET | Freq: Every day | ORAL | 0 refills | Status: DC

## 2018-12-15 ENCOUNTER — Ambulatory Visit: Payer: BLUE CROSS/BLUE SHIELD | Admitting: Cardiology

## 2018-12-23 ENCOUNTER — Encounter: Payer: Self-pay | Admitting: Cardiology

## 2018-12-24 ENCOUNTER — Other Ambulatory Visit: Payer: Self-pay

## 2018-12-24 ENCOUNTER — Ambulatory Visit (INDEPENDENT_AMBULATORY_CARE_PROVIDER_SITE_OTHER): Payer: Medicare HMO | Admitting: Cardiology

## 2018-12-24 ENCOUNTER — Encounter: Payer: Self-pay | Admitting: Cardiology

## 2018-12-24 ENCOUNTER — Ambulatory Visit: Payer: BLUE CROSS/BLUE SHIELD | Admitting: Cardiology

## 2018-12-24 VITALS — BP 138/84 | HR 77 | Temp 98.0°F | Ht 66.0 in | Wt 178.2 lb

## 2018-12-24 DIAGNOSIS — E782 Mixed hyperlipidemia: Secondary | ICD-10-CM

## 2018-12-24 DIAGNOSIS — I10 Essential (primary) hypertension: Secondary | ICD-10-CM

## 2018-12-24 NOTE — Progress Notes (Signed)
Follow up visit  Subjective:   Krystal Gibson, female    DOB: March 27, 1956, 63 y.o.   MRN: [id number]   Chief Complaint  Patient presents with  . Hypertension  . Hyperlipidemia  . Follow-up    2 month     HPI  63 year old African American female with hypertension, hyperlipidemia, irritable bowel syndrome.  Patient has reduced her Crestor from 40 mg to 20 mg due to headaches.  Her symptoms have improved after reducing the dose.  Her blood pressure is very well controlled.  She denies chest pain, shortness of breath, palpitations, leg edema, orthopnea, PND, TIA/syncope.   Past Medical History:  Diagnosis Date  . Allergy   . Anxiety   . Arthritis   . Colitis   . Colon polyp   . Depression   . Diverticulitis   . Diverticulosis   . DJD (degenerative joint disease)   . Hyperlipidemia   . Hypertension   . IBS (irritable bowel syndrome)   . Tension headache      Past Surgical History:  Procedure Laterality Date  . ABDOMINAL HYSTERECTOMY    . BREAST ENHANCEMENT SURGERY    . BREAST EXCISIONAL BIOPSY    . BREAST SURGERY    . CESAREAN SECTION    . COLONOSCOPY    . ORTHOPEDIC SURGERY    . REDUCTION MAMMAPLASTY Bilateral    1980  . SHOULDER SURGERY    . TUBAL LIGATION    . UPPER GASTROINTESTINAL ENDOSCOPY       Social History   Socioeconomic History  . Marital status: Single    Spouse name: Not on file  . Number of children: 2  . Years of education: Not on file  . Highest education level: Not on file  Occupational History  . Occupation: semi-retired  Social Needs  . Financial resource strain: Not on file  . Food insecurity    Worry: Not on file    Inability: Not on file  . Transportation needs    Medical: Not on file    Non-medical: Not on file  Tobacco Use  . Smoking status: Current Every Day Smoker    Packs/day: 0.25    Types: Cigarettes    Last attempt to quit: 02/22/2011    Years since quitting: 7.8  . Smokeless tobacco: Never Used  . Tobacco  comment: 1 cigarette per day  Substance and Sexual Activity  . Alcohol use: No    Comment: stopped 1990  . Drug use: No    Comment: stopped 5 yr ago  . Sexual activity: Not on file  Lifestyle  . Physical activity    Days per week: Not on file    Minutes per session: Not on file  . Stress: Not on file  Relationships  . Social Herbalist on phone: Not on file    Gets together: Not on file    Attends religious service: Not on file    Active member of club or organization: Not on file    Attends meetings of clubs or organizations: Not on file    Relationship status: Not on file  . Intimate partner violence    Fear of current or ex partner: Not on file    Emotionally abused: Not on file    Physically abused: Not on file    Forced sexual activity: Not on file  Other Topics Concern  . Not on file  Social History Narrative   Divorced. Education: [employer]. Exercise: Yes.  Family History  Problem Relation Age of Onset  . Cancer Mother        2014 cervical cancer  . Diabetes Mother   . Hyperlipidemia Mother   . Diabetes Brother   . Hyperlipidemia Brother   . Kidney disease Brother   . Breast cancer Other   . Colon cancer Neg Hx   . Esophageal cancer Neg Hx   . Stomach cancer Neg Hx   . Rectal cancer Neg Hx      Current Outpatient Medications on File Prior to Visit  Medication Sig Dispense Refill  . aspirin EC 81 MG tablet Take 81 mg by mouth daily.    . cholecalciferol (VITAMIN D) 1000 UNITS tablet Take 1,000 Units by mouth daily.    Marland Kitchen desloratadine (CLARINEX) 5 MG tablet Take 1 tablet (5 mg total) by mouth daily. 30 tablet 5  . glycopyrrolate (ROBINUL) 2 MG tablet Take 1 tablet (2 mg total) by mouth 2 (two) times daily. (Patient not taking: Reported on 06/11/2018) 60 tablet 11  . lisinopril-hydrochlorothiazide (ZESTORETIC) 10-12.5 MG tablet Take 1 tablet by mouth daily. 30 tablet 0  . rosuvastatin (CRESTOR) 40 MG tablet Take 0.5 tablets (20 mg total) by mouth  daily. 60 tablet 3   No current facility-administered medications on file prior to visit.     Cardiovascular studies:  EKG 12/24/2018: Sinus rhythm 62 bpm. Possible old inferior infarct.   Exercise Treadmill Stress Test 05/09/2018:  Indication: CP  The patient exercised on Bruce protocol for 5:21 min. Patient achieved 6.37 METS and reached HR 115 bpm, which is 73% of maximum age-predicted HR. Stress test terminated due to Leg discomfort and fatigue.  Exercise capacity was below average for age. HR Response to Exercise: Suboptimal due to low exercise capacity.  BP Response to Exercise: Normal resting BP- appropriate response. Chest Pain: none. Arrhythmias: none.  Resting EKG demonstrates Normal sinus rhythm. ST Changes: With peak exercise there was no ST-T changes of ischemia. However, this is submaximal stress test due to poor exercise tolerance.   Overall Impression: Inconclusive, submaximal stress test due to low exercise capacity.  Recommendation: Consider alternate ischemia testing, if clincial sucpision of CAD is high.   Echocardiogram 04/11/2018: Left ventricle cavity is normal in size. Moderate concentric hypertrophy of the left ventricle. Normal global wall motion. Doppler evidence of grade I (impaired) diastolic dysfunction, normal LAP. Calculated EF 63%. No significant valvular abnormality. Inadequate TR jet to estimate PA systolic pressure. Normal right atrial pressure.  Labs   04/08/2018: Glucose 97. BUN/Cr 14/0.72. eGFR 89. Na/K 143/3.6. AlKP 125 mildly elevated. Rest of the CMP normal.  Chol 262, TG 102, HDL 41, LDL 201 HbAC 5.8%  Recent labs: Results for Krystal Gibson, Krystal Gibson (MRN [id number]) as of 09/11/2018 11:03  Ref. Range 04/07/2018 12:00 04/07/2018 17:06 04/24/2018 16:24 09/04/2018 07:46  Total CHOL/HDL Ratio Latest Ref Range: 0.0 - 4.4 ratio 6.4 (H)     Cholesterol, Total Latest Ref Range: 100 - 199 mg/dL 262 (H)   203 (H)  HDL Cholesterol  Latest Ref Range: >39 mg/dL 41   38 (L)  LDL (calc) Latest Ref Range: 0 - 99 mg/dL 201 (H)   144 (H)  Triglycerides Latest Ref Range: 0 - 149 mg/dL 102   104  VLDL Cholesterol Cal Latest Ref Range: 5 - 40 mg/dL 20   21  Comment: Unknown Comment     Globulin, Total Latest Ref Range: 1.5 - 4.5 g/dL 2.8     Glucose Latest Ref Range:  65 - 99 mg/dL 97     Hemoglobin A1C Latest Ref Range: 4.8 - 5.6 % 5.8 (H)     Est. average glucose Bld gHb Est-mCnc Latest Units: mg/dL 120        Review of Systems  Constitution: Negative for decreased appetite, malaise/fatigue, weight gain and weight loss.  HENT: Negative for congestion.   Eyes: Negative for visual disturbance.  Cardiovascular: Negative for chest pain, dyspnea on exertion, leg swelling, palpitations and syncope.  Respiratory: Negative for cough.   Endocrine: Negative for cold intolerance.  Hematologic/Lymphatic: Does not bruise/bleed easily.  Skin: Negative for itching and rash.  Musculoskeletal: Negative for myalgias.  Gastrointestinal: Negative for abdominal pain, nausea and vomiting.  Genitourinary: Negative for dysuria.  Neurological: Negative for dizziness and weakness.  Psychiatric/Behavioral: The patient is not nervous/anxious.   All other systems reviewed and are negative.       Vitals:   12/24/18 1011  BP: 138/84  Pulse: 77  Temp: 98 F (36.7 C)  SpO2: 99%    Body mass index is 28.76 kg/m. Filed Weights   12/24/18 1011  Weight: 178 lb 3.2 oz (80.8 kg)     Objective:   Physical Exam  Constitutional: She is oriented to person, place, and time. She appears well-developed and well-nourished. No distress.  HENT:  Head: Normocephalic and atraumatic.  Eyes: Pupils are equal, round, and reactive to light. Conjunctivae are normal.  Neck: No JVD present.  Cardiovascular: Normal rate, regular rhythm and intact distal pulses.  Pulmonary/Chest: Effort normal and breath sounds normal. She has no  wheezes. She has no rales.  Abdominal: Soft. Bowel sounds are normal. There is no rebound.  Musculoskeletal:        General: No edema.  Lymphadenopathy:    She has no cervical adenopathy.  Neurological: She is alert and oriented to person, place, and time. No cranial nerve deficit.  Skin: Skin is warm and dry.  Psychiatric: She has a normal mood and affect.  Nursing note and vitals reviewed.         Assessment & Recommendations:   63 year old [name] female with hypertension, hyperlipidemia, irritable bowel syndrome.  Hypertension: Well controlled on lisinopril-HCTZ 10-12.5 mg daily and amlodipine 5 mg daily.  Hyperlipidemia: Significant improvement in LDL with statin.  Continue Crestor 20 mg daily.  Repeat lipid panel in 2 months.  Ideally would like to bring her LDL below 100.  Will consider adding Zetia if LDL still greater than 100.  In absence of primary indication, okay to stop aspirin.  Virtual visit follow-up in 2-3 months to discuss lipid panel results.  Nigel Mormon, MD [facility] [place] Pager: [phone] Office: [phone] If no answer Cell [phone]

## 2019-01-14 ENCOUNTER — Other Ambulatory Visit: Payer: Self-pay | Admitting: Cardiology

## 2019-01-14 DIAGNOSIS — I10 Essential (primary) hypertension: Secondary | ICD-10-CM

## 2019-02-06 DIAGNOSIS — R69 Illness, unspecified: Secondary | ICD-10-CM | POA: Diagnosis not present

## 2019-02-23 DIAGNOSIS — E782 Mixed hyperlipidemia: Secondary | ICD-10-CM | POA: Diagnosis not present

## 2019-02-24 LAB — LIPID PANEL
Chol/HDL Ratio: 5 ratio — ABNORMAL HIGH (ref 0.0–4.4)
Cholesterol, Total: 234 mg/dL — ABNORMAL HIGH (ref 100–199)
HDL: 47 mg/dL (ref >39)
LDL Chol Calc (NIH): 170 mg/dL — ABNORMAL HIGH (ref 0–99)
Triglycerides: 94 mg/dL (ref 0–149)
VLDL Cholesterol Cal: 17 mg/dL (ref 5–40)

## 2019-03-11 ENCOUNTER — Encounter: Payer: Self-pay | Admitting: Cardiology

## 2019-03-11 ENCOUNTER — Ambulatory Visit (INDEPENDENT_AMBULATORY_CARE_PROVIDER_SITE_OTHER): Payer: Medicare HMO | Admitting: Cardiology

## 2019-03-11 ENCOUNTER — Other Ambulatory Visit: Payer: Self-pay

## 2019-03-11 DIAGNOSIS — I10 Essential (primary) hypertension: Secondary | ICD-10-CM | POA: Diagnosis not present

## 2019-03-11 DIAGNOSIS — E782 Mixed hyperlipidemia: Secondary | ICD-10-CM

## 2019-03-11 DIAGNOSIS — Z8249 Family history of ischemic heart disease and other diseases of the circulatory system: Secondary | ICD-10-CM | POA: Diagnosis not present

## 2019-03-11 DIAGNOSIS — H5203 Hypermetropia, bilateral: Secondary | ICD-10-CM | POA: Diagnosis not present

## 2019-03-11 NOTE — Progress Notes (Signed)
Follow up visit  Subjective:   Krystal Gibson, female    DOB: 1955/12/11, 63 y.o.   MRN: [id number]   I connected with the patient on 03/11/2019 by a telephone call and verified that I am speaking with the correct person using two identifiers.     I offered the patient a video enabled application for a virtual visit. Unfortunately, this could not be accomplished due to technical difficulties/lack of video enabled phone/computer. I discussed the limitations of evaluation and management by telemedicine and the availability of in person appointments. The patient expressed understanding and agreed to proceed.   This visit type was conducted due to national recommendations for restrictions regarding the COVID-19 Pandemic (e.g. social distancing).  This format is felt to be most appropriate for this patient at this time.  All issues noted in this document were discussed and addressed.  No physical exam was performed (except for noted visual exam findings with Tele health visits).  The patient has consented to conduct a Tele health visit and understands insurance will be billed.   Chief Complaint  Patient presents with  . Hyperlipidemia     HPI  63 year old [name] female with hypertension, hyperlipidemia, family h/oCAD,  irritable bowel syndrome.  Blood pressure in 130s/80. Lipid panel remains unfavorable. She is currently using Crestor 20 mg daily. She has been unable to tolerate Crestor 40 mg, or any dose of lipitor due to side effects of myalgia and headaches.    Past Medical History:  Diagnosis Date  . Allergy   . Anxiety   . Arthritis   . Colitis   . Colon polyp   . Depression   . Diverticulitis   . Diverticulosis   . DJD (degenerative joint disease)   . Hyperlipidemia   . Hypertension   . IBS (irritable bowel syndrome)   . Tension headache      Past Surgical History:  Procedure Laterality Date  . ABDOMINAL HYSTERECTOMY    . BREAST ENHANCEMENT SURGERY    .  BREAST EXCISIONAL BIOPSY    . BREAST SURGERY    . CESAREAN SECTION    . COLONOSCOPY    . ORTHOPEDIC SURGERY    . REDUCTION MAMMAPLASTY Bilateral    1980  . SHOULDER SURGERY    . TUBAL LIGATION    . UPPER GASTROINTESTINAL ENDOSCOPY       Social History   Socioeconomic History  . Marital status: Single    Spouse name: Not on file  . Number of children: 2  . Years of education: Not on file  . Highest education level: Not on file  Occupational History  . Occupation: semi-retired  Social Needs  . Financial resource strain: Not on file  . Food insecurity    Worry: Not on file    Inability: Not on file  . Transportation needs    Medical: Not on file    Non-medical: Not on file  Tobacco Use  . Smoking status: Current Every Day Smoker    Packs/day: 0.25    Types: Cigarettes    Last attempt to quit: 02/22/2011    Years since quitting: 8.0  . Smokeless tobacco: Never Used  . Tobacco comment: 1 cigarette per day  Substance and Sexual Activity  . Alcohol use: No    Comment: stopped 1990  . Drug use: No    Comment: stopped 5 yr ago  . Sexual activity: Not on file  Lifestyle  . Physical activity    Days per  week: Not on file    Minutes per session: Not on file  . Stress: Not on file  Relationships  . Social Herbalist on phone: Not on file    Gets together: Not on file    Attends religious service: Not on file    Active member of club or organization: Not on file    Attends meetings of clubs or organizations: Not on file    Relationship status: Not on file  . Intimate partner violence    Fear of current or ex partner: Not on file    Emotionally abused: Not on file    Physically abused: Not on file    Forced sexual activity: Not on file  Other Topics Concern  . Not on file  Social History Narrative   Divorced. Education: [employer]. Exercise: Yes.     Family History  Problem Relation Age of Onset  . Cancer Mother        2014 cervical cancer  . Diabetes  Mother   . Hyperlipidemia Mother   . Diabetes Brother   . Hyperlipidemia Brother   . Kidney disease Brother   . Breast cancer Other   . Colon cancer Neg Hx   . Esophageal cancer Neg Hx   . Stomach cancer Neg Hx   . Rectal cancer Neg Hx      Current Outpatient Medications on File Prior to Visit  Medication Sig Dispense Refill  . cholecalciferol (VITAMIN D) 1000 UNITS tablet Take 1,000 Units by mouth daily.    Marland Kitchen desloratadine (CLARINEX) 5 MG tablet Take 1 tablet (5 mg total) by mouth daily. 30 tablet 5  . glycopyrrolate (ROBINUL) 2 MG tablet Take 1 tablet (2 mg total) by mouth 2 (two) times daily. 60 tablet 11  . lisinopril-hydrochlorothiazide (ZESTORETIC) 10-12.5 MG tablet TAKE 1 TABLET BY MOUTH DAILY 90 tablet 0  . rosuvastatin (CRESTOR) 40 MG tablet Take 0.5 tablets (20 mg total) by mouth daily. 60 tablet 3   No current facility-administered medications on file prior to visit.     Cardiovascular studies:  EKG 12/24/2018: Sinus rhythm 62 bpm. Possible old inferior infarct.   Exercise Treadmill Stress Test 05/09/2018:  Indication: CP  The patient exercised on Bruce protocol for 5:21 min. Patient achieved 6.37 METS and reached HR 115 bpm, which is 73% of maximum age-predicted HR. Stress test terminated due to Leg discomfort and fatigue.  Exercise capacity was below average for age. HR Response to Exercise: Suboptimal due to low exercise capacity.  BP Response to Exercise: Normal resting BP- appropriate response. Chest Pain: none. Arrhythmias: none.  Resting EKG demonstrates Normal sinus rhythm. ST Changes: With peak exercise there was no ST-T changes of ischemia. However, this is submaximal stress test due to poor exercise tolerance.   Overall Impression: Inconclusive, submaximal stress test due to low exercise capacity.  Recommendation: Consider alternate ischemia testing, if clincial sucpision of CAD is high.   Echocardiogram 04/11/2018: Left ventricle  cavity is normal in size. Moderate concentric hypertrophy of the left ventricle. Normal global wall motion. Doppler evidence of grade I (impaired) diastolic dysfunction, normal LAP. Calculated EF 63%. No significant valvular abnormality. Inadequate TR jet to estimate PA systolic pressure. Normal right atrial pressure.  Labs: Results for Krystal Gibson, Krystal Gibson (MRN [id number]) as of 03/11/2019 10:55  Ref. Range 09/04/2018 07:46 12/24/2018 10:39 02/23/2019 08:15  Total CHOL/HDL Ratio Latest Ref Range: 0.0 - 4.4 ratio   5.0 (H)  Cholesterol, Total Latest Ref Range: 100 -  199 mg/dL 203 (H)  234 (H)  HDL Cholesterol Latest Ref Range: >39 mg/dL 38 (L)  47  LDL (calc) Latest Ref Range: 0 - 99 mg/dL 144 (H)  170 (H)  Triglycerides Latest Ref Range: 0 - 149 mg/dL 104  94  VLDL Cholesterol Cal Latest Ref Range: 5 - 40 mg/dL 21  17    04/08/2018: Glucose 97. BUN/Cr 14/0.72. eGFR 89. Na/K 143/3.6. AlKP 125 mildly elevated. Rest of the CMP normal.  Chol 262, TG 102, HDL 41, LDL 201 HbAC 5.8%    Review of Systems  Constitution: Negative for decreased appetite, malaise/fatigue, weight gain and weight loss.  HENT: Negative for congestion.   Eyes: Negative for visual disturbance.  Cardiovascular: Negative for chest pain, dyspnea on exertion, leg swelling, palpitations and syncope.  Respiratory: Negative for cough.   Endocrine: Negative for cold intolerance.  Hematologic/Lymphatic: Does not bruise/bleed easily.  Skin: Negative for itching and rash.  Musculoskeletal: Negative for myalgias.  Gastrointestinal: Negative for abdominal pain, nausea and vomiting.  Genitourinary: Negative for dysuria.  Neurological: Negative for dizziness and weakness.  Psychiatric/Behavioral: The patient is not nervous/anxious.   All other systems reviewed and are negative.       Vitals not available today.   Objective:   Physical Exam  Not performed. Telephone visit.      Assessment & Recommendations:   63 year old  [name] female with hypertension, hyperlipidemia, irritable bowel syndrome.  Hypertension: Well controlled on lisinopril-HCTZ 10-12.5 mg daily and amlodipine 5 mg daily.  Hyperlipidemia: Remains suboptimal. Unable to tolerate more than Crestor 20 mg daily dose. She is not interested in injectable lipid lowering therapy. Will obtain calcium score for risk stratification. If CAD note, will consider adding bempedoic acid + ezetimibe combination.   Nigel Mormon, MD [facility] Pager: [phone] Office: [phone] If no answer Cell [phone]

## 2019-03-26 NOTE — Telephone Encounter (Signed)
Per patient

## 2019-03-26 NOTE — Telephone Encounter (Signed)
Please read

## 2019-03-27 DIAGNOSIS — Z03818 Encounter for observation for suspected exposure to other biological agents ruled out: Secondary | ICD-10-CM | POA: Diagnosis not present

## 2019-04-15 NOTE — Progress Notes (Signed)
Follow up visit  Subjective:   Krystal Gibson, female    DOB: 1956/02/16, 63 y.o.   MRN: [id number]   I connected with the patient on 04/21/2019 by a telephone call and verified that I am speaking with the correct person using two identifiers.     I offered the patient a video enabled application for a virtual visit. Unfortunately, this could not be accomplished due to technical difficulties/lack of video enabled phone/computer. I discussed the limitations of evaluation and management by telemedicine and the availability of in person appointments. The patient expressed understanding and agreed to proceed.   This visit type was conducted due to national recommendations for restrictions regarding the COVID-19 Pandemic (e.g. social distancing).  This format is felt to be most appropriate for this patient at this time.  All issues noted in this document were discussed and addressed.  No physical exam was performed (except for noted visual exam findings with Tele health visits).  The patient has consented to conduct a Tele health visit and understands insurance will be billed.    Chief Complaint  Patient presents with  . Hyperlipidemia     HPI  63 year old African American female with hypertension, hyperlipidemia, family h/oCAD,  irritable bowel syndrome.  Blood pressure in 130s/80. Lipid panel remains unfavorable. She is currently using Crestor 20 mg daily. Calcium score is elevated. She denies chest pain, shortness of breath, palpitations, leg edema, orthopnea, PND, TIA/syncope.  She has not been able to tolerate lipitor, or doses higher than 20 mg Crestor due to side effects. She is unwilling to try injectable medication at this time.   Past Medical History:  Diagnosis Date  . Allergy   . Anxiety   . Arthritis   . Colitis   . Colon polyp   . Depression   . Diverticulitis   . Diverticulosis   . DJD (degenerative joint disease)   . Hyperlipidemia   . Hypertension   . IBS  (irritable bowel syndrome)   . Tension headache      Past Surgical History:  Procedure Laterality Date  . ABDOMINAL HYSTERECTOMY    . BREAST ENHANCEMENT SURGERY    . BREAST EXCISIONAL BIOPSY    . BREAST SURGERY    . CESAREAN SECTION    . COLONOSCOPY    . ORTHOPEDIC SURGERY    . REDUCTION MAMMAPLASTY Bilateral    1980  . SHOULDER SURGERY    . TUBAL LIGATION    . UPPER GASTROINTESTINAL ENDOSCOPY       Social History   Socioeconomic History  . Marital status: Single    Spouse name: Not on file  . Number of children: 2  . Years of education: Not on file  . Highest education level: Not on file  Occupational History  . Occupation: semi-retired  Tobacco Use  . Smoking status: Current Every Day Smoker    Packs/day: 0.25    Types: Cigarettes    Last attempt to quit: 02/22/2011    Years since quitting: 8.1  . Smokeless tobacco: Never Used  . Tobacco comment: 1 cigarette per day  Substance and Sexual Activity  . Alcohol use: No    Comment: stopped 1990  . Drug use: No    Comment: stopped 5 yr ago  . Sexual activity: Not on file  Other Topics Concern  . Not on file  Social History Narrative   Divorced. Education: [employer]. Exercise: Yes.   Social Determinants of Health   Financial Resource Strain:   .  Difficulty of Paying Living Expenses: Not on file  Food Insecurity:   . Worried About Charity fundraiser in the Last Year: Not on file  . Ran Out of Food in the Last Year: Not on file  Transportation Needs:   . Lack of Transportation (Medical): Not on file  . Lack of Transportation (Non-Medical): Not on file  Physical Activity:   . Days of Exercise per Week: Not on file  . Minutes of Exercise per Session: Not on file  Stress:   . Feeling of Stress : Not on file  Social Connections:   . Frequency of Communication with Friends and Family: Not on file  . Frequency of Social Gatherings with Friends and Family: Not on file  . Attends Religious Services: Not on file  .  Active Member of Clubs or Organizations: Not on file  . Attends Archivist Meetings: Not on file  . Marital Status: Not on file  Intimate Partner Violence:   . Fear of Current or Ex-Partner: Not on file  . Emotionally Abused: Not on file  . Physically Abused: Not on file  . Sexually Abused: Not on file     Family History  Problem Relation Age of Onset  . Cancer Mother        2014 cervical cancer  . Diabetes Mother   . Hyperlipidemia Mother   . CAD Sister        Coronary stent in 44s  . Diabetes Brother   . Hyperlipidemia Brother   . Kidney disease Brother   . Breast cancer Other   . Colon cancer Neg Hx   . Esophageal cancer Neg Hx   . Stomach cancer Neg Hx   . Rectal cancer Neg Hx      Current Outpatient Medications on File Prior to Visit  Medication Sig Dispense Refill  . cholecalciferol (VITAMIN D) 1000 UNITS tablet Take 1,000 Units by mouth daily.    Marland Kitchen desloratadine (CLARINEX) 5 MG tablet Take 1 tablet (5 mg total) by mouth daily. 30 tablet 5  . lisinopril-hydrochlorothiazide (ZESTORETIC) 10-12.5 MG tablet TAKE 1 TABLET BY MOUTH DAILY 90 tablet 0  . rosuvastatin (CRESTOR) 40 MG tablet Take 0.5 tablets (20 mg total) by mouth daily. 60 tablet 3   No current facility-administered medications on file prior to visit.    Cardiovascular studies:  CT cardiac scoring 03/2019: Calcium score: 159. LM: 0. LAD: 67. Cx: 37. RCA: 55  EKG 12/24/2018: Sinus rhythm 62 bpm. Possible old inferior infarct.   Exercise Treadmill Stress Test 05/09/2018:  Indication: CP  The patient exercised on Bruce protocol for 5:21 min. Patient achieved 6.37 METS and reached HR 115 bpm, which is 73% of maximum age-predicted HR. Stress test terminated due to Leg discomfort and fatigue.  Exercise capacity was below average for age. HR Response to Exercise: Suboptimal due to low exercise capacity.  BP Response to Exercise: Normal resting BP- appropriate response. Chest Pain:  none. Arrhythmias: none.  Resting EKG demonstrates Normal sinus rhythm. ST Changes: With peak exercise there was no ST-T changes of ischemia. However, this is submaximal stress test due to poor exercise tolerance.    Overall Impression: Inconclusive, submaximal stress test due to low exercise capacity.  Recommendation: Consider alternate ischemia testing, if clincial sucpision of CAD is high.   Echocardiogram 04/11/2018: Left ventricle cavity is normal in size. Moderate concentric hypertrophy of the left ventricle. Normal global wall motion. Doppler evidence of grade I (impaired) diastolic dysfunction, normal LAP. Calculated  EF 63%. No significant valvular abnormality. Inadequate TR jet to estimate PA systolic pressure. Normal right atrial pressure.  Labs: 09/04/2018: Chol 203, TG 104, HDL 38, LDL 144  02/23/2019: Chol 234, TG 94, HDL 47, LDL 170  04/08/2018: Glucose 97. BUN/Cr 14/0.72. eGFR 89. Na/K 143/3.6. AlKP 125 mildly elevated. Rest of the CMP normal.  Chol 262, TG 102, HDL 41, LDL 201 HbAC 5.8%    Review of Systems  Constitution: Negative for decreased appetite, malaise/fatigue, weight gain and weight loss.  HENT: Negative for congestion.   Eyes: Negative for visual disturbance.  Cardiovascular: Negative for chest pain, dyspnea on exertion, leg swelling, palpitations and syncope.  Respiratory: Negative for cough.   Endocrine: Negative for cold intolerance.  Hematologic/Lymphatic: Does not bruise/bleed easily.  Skin: Negative for itching and rash.  Musculoskeletal: Negative for myalgias.  Gastrointestinal: Negative for abdominal pain, nausea and vomiting.  Genitourinary: Negative for dysuria.  Neurological: Negative for dizziness and weakness.  Psychiatric/Behavioral: The patient is not nervous/anxious.   All other systems reviewed and are negative.       Vitals not available today.   Objective:   Physical ExamNot performed. Telephone visit.        Assessment & Recommendations:   63 year old [name] female with hypertension, hyperlipidemia, irritable bowel syndrome.  Hypertension: Well controlled on lisinopril-HCTZ 10-12.5 mg daily and amlodipine 5 mg daily.  Hyperlipidemia: Remains suboptimal. Unable to tolerate lipitor, or more than Crestor 20 mg daily dose. Calcium score is elevated, suggesting subclinical coronary artery disease. I do not think lower intensity statin will be adequate. She is not interested in injectable lipid lowering therapy. Recommend adding bempedoic acid + ezetimibe 180-10 mg daily.  Repeat lipid panel in 3 months.   Nigel Mormon, MD [facility] Pager: [phone] Office: [phone] If no answer Cell [phone]

## 2019-04-22 ENCOUNTER — Other Ambulatory Visit: Payer: Self-pay

## 2019-04-22 ENCOUNTER — Encounter: Payer: Self-pay | Admitting: Cardiology

## 2019-04-22 ENCOUNTER — Ambulatory Visit (INDEPENDENT_AMBULATORY_CARE_PROVIDER_SITE_OTHER): Payer: Medicare HMO | Admitting: Cardiology

## 2019-04-22 VITALS — BP 120/75

## 2019-04-22 DIAGNOSIS — I2510 Atherosclerotic heart disease of native coronary artery without angina pectoris: Secondary | ICD-10-CM | POA: Insufficient documentation

## 2019-04-22 DIAGNOSIS — R931 Abnormal findings on diagnostic imaging of heart and coronary circulation: Secondary | ICD-10-CM | POA: Insufficient documentation

## 2019-04-22 DIAGNOSIS — E782 Mixed hyperlipidemia: Secondary | ICD-10-CM | POA: Diagnosis not present

## 2019-04-22 MED ORDER — NEXLIZET 180-10 MG PO TABS: 1.0000 | ORAL_TABLET | Freq: Every day | ORAL | 2 refills | Status: DC

## 2019-04-28 ENCOUNTER — Telehealth: Payer: Self-pay

## 2019-04-28 NOTE — Telephone Encounter (Signed)
Patient called stating that the new medication BEMPEDIOC ACID - EZETIMIBE 180-10MG  has been giving her severe nausea and abdominal pain. Patient stated that she has diverticulitis and is concerned that this discomfort may be something more serious.Does she need to stop this medication or what she should do? Please advise.

## 2019-04-28 NOTE — Telephone Encounter (Signed)
Patient aware.

## 2019-04-28 NOTE — Telephone Encounter (Signed)
Hold medication for rest of the week. If symptoms do not improve, she should touch base with her PCP.   Thanks MJP

## 2019-05-06 ENCOUNTER — Encounter: Payer: Self-pay | Admitting: *Deleted

## 2019-05-07 ENCOUNTER — Other Ambulatory Visit: Payer: Self-pay | Admitting: Family Medicine

## 2019-05-07 DIAGNOSIS — Z1231 Encounter for screening mammogram for malignant neoplasm of breast: Secondary | ICD-10-CM

## 2019-05-15 ENCOUNTER — Encounter: Payer: Self-pay | Admitting: Family Medicine

## 2019-05-19 ENCOUNTER — Telehealth: Payer: Self-pay | Admitting: Family Medicine

## 2019-05-19 DIAGNOSIS — I10 Essential (primary) hypertension: Secondary | ICD-10-CM

## 2019-05-19 NOTE — Telephone Encounter (Signed)
Medication Refill - Medication: lisinopril-hydrochlorothiazide (ZESTORETIC) 10-12.5 MG tablet [id number]     Preferred Pharmacy (with phone number or street name):  [id number] - [place], [place]  [id number] [place] [id number]  Phone: [phone] Fax: [phone]     Agent: Please be advised that RX refills may take up to 3 business days. We ask that you follow-up with your pharmacy.

## 2019-05-20 NOTE — Telephone Encounter (Signed)
Pt stated she does not have any medication remaining and when she contacted her pharmacy she was told that they have not received approval to refill medication. Pt requests call back

## 2019-05-21 ENCOUNTER — Other Ambulatory Visit: Payer: Self-pay | Admitting: Family Medicine

## 2019-05-21 DIAGNOSIS — I10 Essential (primary) hypertension: Secondary | ICD-10-CM

## 2019-05-21 NOTE — Telephone Encounter (Signed)
Please schedule patient appt for refills patient have not been seen her since last year

## 2019-05-21 NOTE — Telephone Encounter (Signed)
Patient was last seen by you on 04/07/18 but patient want refill on lisinopril which was last filled by DR Virgina Jock on 01/14/19. Patient has had lipid checked on 02/23/19 by Gertie Fey. But patient do not have any upcoming appt with you. I did send a msg to scheduling to get this patient scheduled for an office visit. Is it ok to send in a 30 day supply

## 2019-05-22 ENCOUNTER — Ambulatory Visit: Payer: Medicare HMO | Admitting: Gastroenterology

## 2019-05-22 ENCOUNTER — Encounter: Payer: Self-pay | Admitting: Gastroenterology

## 2019-05-22 VITALS — BP 122/90 | HR 68 | Temp 98.0°F | Ht 66.0 in | Wt 178.8 lb

## 2019-05-22 DIAGNOSIS — K582 Mixed irritable bowel syndrome: Secondary | ICD-10-CM | POA: Diagnosis not present

## 2019-05-22 DIAGNOSIS — R1012 Left upper quadrant pain: Secondary | ICD-10-CM | POA: Diagnosis not present

## 2019-05-22 MED ORDER — GLYCOPYRROLATE 1 MG PO TABS: 1.0000 mg | ORAL_TABLET | Freq: Two times a day (BID) | ORAL | 11 refills | Status: DC

## 2019-05-22 NOTE — Progress Notes (Signed)
    History of Present Illness: This is a 64 year old LUQ pain for 4 days which is positional.  She noted an increase in abdominal pain after starting Nexlizet.  She discontinued medication and symptoms abated however she restarted medication at half the dose and has had soreness in her left upper quadrant area that that worsens with breathing movement and certain positions.  It does not appear to be related to any digestive function.  She also has noted muscle spasms in her back which started this morning her muscle spasms in her back which started this morning but also worsened with position changes.  She has ongoing difficulties IBS with alternating diarrhea and constipation associated with abdominal comfort discomfort.  She is taking glycopyrrolate intermittently when her abdominal pain has worsened but not recently.   Jan 2019 Colonoscopy  - Moderate diverticulosis at the hepatic flexure, in the ascending colon and in the cecum. There was no evidence of diverticular bleeding. - Severe diverticulosis in the sigmoid colon, in the descending colon, at the splenic flexure and in the transverse colon. There was evidence of diverticular spasm. - Internal hemorrhoids.   Current Medications, Allergies, Past Medical History, Past Surgical History, Family History and Social History were reviewed in [employer] record.   Physical Exam: General: Well developed, well nourished, no acute distress Head: Normocephalic and atraumatic Eyes:  sclerae anicteric, EOMI Ears: Normal auditory acuity Mouth: No deformity or lesions Lungs: Clear throughout to auscultation Heart: Regular rate and rhythm; no murmurs, rubs or bruits Abdomen: Soft, non tender and non distended. No masses, hepatosplenomegaly or hernias noted. Normal Bowel sounds Rectal: Not done Musculoskeletal: Symmetrical with no gross deformities  Pulses:  Normal pulses noted Extremities: No clubbing, cyanosis, edema or  deformities noted Neurological: Alert oriented x 4, grossly nonfocal Psychological:  Alert and cooperative. Normal mood and affect   Assessment and Recommendations:  1. LUQ pain with positioning. Possible Nexlizet side effect so discontinue. Tylenol as directed for persistent positional pain. Call in 1 week if symptoms not controlled.   2.  IBS with alternating pattern.  Diverticulosis.  Increase dietary fiber as tolerated and increase daily fluid intake.  Resume glycopyrrolate 1 mg twice daily as needed for abdominal pain.  Call in 1 week if symptoms not controlled to consider further evaluation with blood work and imaging studies.

## 2019-05-22 NOTE — Patient Instructions (Signed)
Restart robinul 1 mg one tablet by mouth twice daily. A new prescription has been sent to your pharmacy.   Stop taking Nexlizet.   You can take Tylenol as needed.   If your symptoms are not better in a week, then call our office.   Thank you for choosing me and [facility].  Pricilla Riffle. Dagoberto Ligas., MD., [facility]  Normal BMI (Body Mass Index- based on height and weight) is between 23 and 30. Your BMI today is Body mass index is 28.86 kg/m. Marland Kitchen Please consider follow up  regarding your BMI with your Primary Care Provider.

## 2019-05-25 ENCOUNTER — Other Ambulatory Visit: Payer: Self-pay

## 2019-05-25 ENCOUNTER — Telehealth (INDEPENDENT_AMBULATORY_CARE_PROVIDER_SITE_OTHER): Payer: Medicare HMO | Admitting: Family Medicine

## 2019-05-25 DIAGNOSIS — I10 Essential (primary) hypertension: Secondary | ICD-10-CM | POA: Diagnosis not present

## 2019-05-25 MED ORDER — AMLODIPINE BESYLATE 5 MG PO TABS: 5.0000 mg | ORAL_TABLET | Freq: Every day | ORAL | 1 refills | Status: DC

## 2019-05-25 MED ORDER — LISINOPRIL-HYDROCHLOROTHIAZIDE 10-12.5 MG PO TABS: 1.0000 | ORAL_TABLET | Freq: Every day | ORAL | 1 refills | Status: DC

## 2019-05-25 NOTE — Progress Notes (Signed)
Telemedicine Encounter- SOAP NOTE Established Patient  This telephone encounter was conducted with the patient's (or proxy's) verbal consent via audio telecommunications: yes/no: Yes Patient was instructed to have this encounter in a suitably private space; and to only have persons present to whom they give permission to participate. In addition, patient identity was confirmed by use of name plus two identifiers (DOB and address).  I discussed the limitations, risks, security and privacy concerns of performing an evaluation and management service by telephone and the availability of in person appointments. I also discussed with the patient that there may be a patient responsible charge related to this service. The patient expressed understanding and agreed to proceed.  I spent a total of TIME; 0 MIN TO 60 MIN: 15 minutes talking with the patient or their proxy.  Chief Complaint  Patient presents with  . Medication Refill    Pt would like a refill on her hypertention medication. pt states this medication is working well. pt states no side effects.    Subjective   Krystal Gibson is a 64 y.o. established patient. Telephone visit today for  HPI  Hypertension: Patient here for follow-up of elevated blood pressure. She is exercising and is adherent to low salt diet.  Blood pressure is well controlled at home. Cardiac symptoms none. Patient denies chest pain, claudication, exertional chest pressure/discomfort, irregular heart beat and lower extremity edema.  Cardiovascular risk factors: dyslipidemia and hypertension. Use of agents associated with hypertension: none. History of target organ damage: none. BP Readings from Last 3 Encounters:  05/22/19 122/90  04/22/19 120/75  12/24/18 138/84   At home her bp is 130/80 this morning  She keeps her bp monitor and checks it every day   Dr. Joya Gaskins is prescribing her Amlodipine 10m and Lisinopril-hctz 10-12.572mbut she would like our office to  monitor and refill as well.   She will get labs March 30 for lipid panel.   Patient Active Problem List   Diagnosis Date Noted  . Elevated coronary artery calcium score 04/22/2019  . Coronary artery disease involving native coronary artery of native heart without angina pectoris 04/22/2019  . Family history of early CAD 03/11/2019  . Essential hypertension 09/11/2018  . Diabetes type 2, uncontrolled ([place]/18/2020  . Type 2 diabetes mellitus ([place]/18/2020  . Sleep apnea 06/10/2018  . Bilateral leg edema 06/10/2018  . Dyspnea on exertion 06/10/2018  . Statin intolerance 02/25/2017  . Mixed hyperlipidemia 02/25/2017  . Tobacco use disorder 02/25/2017  . Generalized abdominal pain 06/10/2014  . Seasonal allergies 01/21/2014  . Depression 01/08/2012  . Anxiety 06/05/2011  . DIVERTICULITIS OF COLON 06/09/2010  . IBS 06/09/2010  . OBESITY 12/05/2007  . HEADACHE, TENSION 12/05/2007  . Resistant hypertension 12/05/2007  . Diverticulosis of large intestine 12/05/2007  . DEGENERATIVE JOINT DISEASE, RIGHT SHOULDER 12/05/2007  . Osteoarthrosis involving lower leg 12/05/2007    Past Medical History:  Diagnosis Date  . Allergy   . Anxiety   . Arthritis   . Colitis   . Colon polyp   . Depression   . Diverticulitis   . Diverticulosis   . DJD (degenerative joint disease)   . Hyperlipidemia   . Hypertension   . IBS (irritable bowel syndrome)   . Tension headache     Current Outpatient Medications  Medication Sig Dispense Refill  . amLODipine (NORVASC) 5 MG tablet Take 1 tablet (5 mg total) by mouth daily. 90 tablet 1  . cholecalciferol (VITAMIN D)  1000 UNITS tablet Take 1,000 Units by mouth daily.    Marland Kitchen glycopyrrolate (ROBINUL) 1 MG tablet Take 1 tablet (1 mg total) by mouth 2 (two) times daily. 60 tablet 11  . lisinopril-hydrochlorothiazide (ZESTORETIC) 10-12.5 MG tablet Take 1 tablet by mouth daily. 90 tablet 1  . rosuvastatin (CRESTOR) 40 MG tablet Take 0.5 tablets (20 mg  total) by mouth daily. 60 tablet 3   No current facility-administered medications for this visit.    Allergies  Allergen Reactions  . Sulfa Antibiotics Swelling    Eyes swollen and redness    Social History   Socioeconomic History  . Marital status: Single    Spouse name: Not on file  . Number of children: 2  . Years of education: Not on file  . Highest education level: Not on file  Occupational History  . Occupation: semi-retired  Tobacco Use  . Smoking status: Current Every Day Smoker    Packs/day: 0.25    Types: Cigarettes    Last attempt to quit: 02/22/2011    Years since quitting: 8.2  . Smokeless tobacco: Never Used  . Tobacco comment: 1 cigarette per day  Substance and Sexual Activity  . Alcohol use: No    Comment: stopped 1990  . Drug use: No    Comment: stopped 5 yr ago  . Sexual activity: Not on file  Other Topics Concern  . Not on file  Social History Narrative   Divorced. Education: [employer]. Exercise: Yes.   Social Determinants of Health   Financial Resource Strain:   . Difficulty of Paying Living Expenses: Not on file  Food Insecurity:   . Worried About Charity fundraiser in the Last Year: Not on file  . Ran Out of Food in the Last Year: Not on file  Transportation Needs:   . Lack of Transportation (Medical): Not on file  . Lack of Transportation (Non-Medical): Not on file  Physical Activity:   . Days of Exercise per Week: Not on file  . Minutes of Exercise per Session: Not on file  Stress:   . Feeling of Stress : Not on file  Social Connections:   . Frequency of Communication with Friends and Family: Not on file  . Frequency of Social Gatherings with Friends and Family: Not on file  . Attends Religious Services: Not on file  . Active Member of Clubs or Organizations: Not on file  . Attends Archivist Meetings: Not on file  . Marital Status: Not on file  Intimate Partner Violence:   . Fear of Current or Ex-Partner: Not on file  .  Emotionally Abused: Not on file  . Physically Abused: Not on file  . Sexually Abused: Not on file    ROS See hpi  Objective   Vitals as reported by the patient: Today's Vitals   05/25/19 1054  Weight: 178 lb (80.7 kg)  Height: '5\' 6"'  (1.676 m)   Normal pulmonary effort Alert, oriented x 3   Krystal Gibson was seen today for medication refill.  Diagnoses and all orders for this visit:  Essential hypertension -     lisinopril-hydrochlorothiazide (ZESTORETIC) 10-12.5 MG tablet; Take 1 tablet by mouth daily. -     CMP14+EGFR; Future  Other orders -     amLODipine (NORVASC) 5 MG tablet; Take 1 tablet (5 mg total) by mouth daily.   Patient's blood pressure is at goal of 139/89 or less. Condition is stable. Continue current medications and treatment plan. I recommend that  you exercise for 30-45 minutes 5 days a week. I also recommend a balanced diet with fruits and vegetables every day, lean meats, and little fried foods. The DASH diet (you can find this online) is a good example of this.    I discussed the assessment and treatment plan with the patient. The patient was provided an opportunity to ask questions and all were answered. The patient agreed with the plan and demonstrated an understanding of the instructions.   The patient was advised to call back or seek an in-person evaluation if the symptoms worsen or if the condition fails to improve as anticipated.  I provided 15 minutes of non-face-to-face time during this encounter.  Forrest Moron, MD  Primary Care at [facility]

## 2019-05-26 NOTE — Telephone Encounter (Signed)
Pt had a telemed with pcp on 05/25/19

## 2019-05-31 ENCOUNTER — Other Ambulatory Visit: Payer: Self-pay

## 2019-05-31 DIAGNOSIS — E782 Mixed hyperlipidemia: Secondary | ICD-10-CM

## 2019-06-03 MED ORDER — ROSUVASTATIN CALCIUM 20 MG PO TABS: 20.0000 mg | ORAL_TABLET | Freq: Every day | ORAL | 3 refills | Status: DC

## 2019-06-03 NOTE — Telephone Encounter (Signed)
refill 

## 2019-06-08 DIAGNOSIS — R69 Illness, unspecified: Secondary | ICD-10-CM | POA: Diagnosis not present

## 2019-06-10 DIAGNOSIS — R69 Illness, unspecified: Secondary | ICD-10-CM | POA: Diagnosis not present

## 2019-06-16 ENCOUNTER — Other Ambulatory Visit: Payer: Self-pay

## 2019-06-16 ENCOUNTER — Ambulatory Visit
Admission: RE | Admit: 2019-06-16 | Discharge: 2019-06-16 | Disposition: A | Discharge status: Home / Self Care | Payer: Medicare HMO | Source: Ambulatory Visit | Attending: Family Medicine | Admitting: Family Medicine

## 2019-06-16 DIAGNOSIS — Z1231 Encounter for screening mammogram for malignant neoplasm of breast: Secondary | ICD-10-CM

## 2019-07-03 ENCOUNTER — Ambulatory Visit: Payer: Medicare HMO | Admitting: Nurse Practitioner

## 2019-07-03 ENCOUNTER — Other Ambulatory Visit: Payer: Self-pay

## 2019-07-03 ENCOUNTER — Encounter: Payer: Self-pay | Admitting: Nurse Practitioner

## 2019-07-03 ENCOUNTER — Encounter: Payer: Self-pay | Admitting: Gastroenterology

## 2019-07-03 ENCOUNTER — Other Ambulatory Visit (INDEPENDENT_AMBULATORY_CARE_PROVIDER_SITE_OTHER): Payer: Medicare HMO

## 2019-07-03 VITALS — BP 120/70 | HR 72 | Temp 98.0°F | Ht 66.0 in | Wt 179.0 lb

## 2019-07-03 DIAGNOSIS — R1013 Epigastric pain: Secondary | ICD-10-CM

## 2019-07-03 LAB — HEPATIC FUNCTION PANEL
ALT: 18 U/L (ref 0–35)
AST: 14 U/L (ref 0–37)
Albumin: 4.4 g/dL (ref 3.5–5.2)
Alkaline Phosphatase: 98 U/L (ref 39–117)
Bilirubin, Direct: 0.2 mg/dL (ref 0.0–0.3)
Total Bilirubin: 1.1 mg/dL (ref 0.2–1.2)
Total Protein: 7.8 g/dL (ref 6.0–8.3)

## 2019-07-03 LAB — LIPASE: Lipase: 16 U/L (ref 11.0–59.0)

## 2019-07-03 NOTE — Progress Notes (Signed)
IMPRESSION and PLAN:    Krystal Gibson is a 64 y.o. female with a pmh significant for, not necessarily limited to severe diverticulosis, hyperlipidemia, diabetes, sleep apnea, hypertension  #Recurrent upper abdominal pain (she localizes pain to the epigastrium) radiating around to back -Only temporarily improvement of pain after discontinuation of Nexlizet.  Pain not positional, not related to movement -CMP, lipase -Evaluation patient will be scheduled for EGD. The risks and benefits of EGD were discussed and the patient agrees to proceed.     HPI:    Primary GI: Dr. Fuller Plan  Chief complaint : Upper abdominal pain  Krystal Gibson saw Dr. Derrill Memo late January of this year for evaluation of LUQ pain with positioning /BS with alternating pattern.  Her dietary fiber and fluid intake were increased, glycopyrrolate resumed for abdominal pain.  For the LUQ pain there was question of whether it was secondary to Nexlizet which we discontinued.  Her upper abdominal pain resolved, she did fine for a few weeks then began having recurrent pain on 3 3.  She points to the epigastrium as location of the pain.  When it started on 3/3 it was constant, lasted for 7 days and even woken her up at night.  At times the nagging pain radiated around to her back in a bandlike fashion.  The pain was not consistently aggravated or alleviated with eating nor bowel movements.  Is not related to position nor physical activity.  For unclear reasons she has not had any pain today . Some mild intermittent nausea has been associated with the pain.  Her weight has been stable.  Patient does not take NSAIDs.  She is taking Robinul for IBS cramps.    Review of systems:     No chest pain, no SOB, no fevers, no urinary sx   Past Medical History:  Diagnosis Date  . Allergy   . Anxiety   . Arthritis   . Colitis   . Colon polyp   . Depression   . Diverticulitis   . Diverticulosis   . DJD (degenerative joint  disease)   . Hyperlipidemia   . Hypertension   . IBS (irritable bowel syndrome)   . Tension headache     Patient's surgical history, family medical history, social history, medications and allergies were all reviewed in Epic   Creatinine clearance cannot be calculated (Patient's most recent lab result is older than the maximum 21 days allowed.)  Current Outpatient Medications  Medication Sig Dispense Refill  . amLODipine (NORVASC) 5 MG tablet Take 1 tablet (5 mg total) by mouth daily. 90 tablet 1  . cholecalciferol (VITAMIN D) 1000 UNITS tablet Take 1,000 Units by mouth daily.    Marland Kitchen glycopyrrolate (ROBINUL) 1 MG tablet Take 1 tablet (1 mg total) by mouth 2 (two) times daily. 60 tablet 11  . lisinopril-hydrochlorothiazide (ZESTORETIC) 10-12.5 MG tablet Take 1 tablet by mouth daily. 90 tablet 1  . rosuvastatin (CRESTOR) 20 MG tablet Take 1 tablet (20 mg total) by mouth daily. 90 tablet 3   No current facility-administered medications for this visit.    Physical Exam:     BP 120/70   Pulse 72   Temp 98 F (36.7 C)   Ht 5\' 6"  (1.676 m)   Wt 179 lb (81.2 kg)   BMI 28.89 kg/m   GENERAL:  Pleasant female in NAD PSYCH: : Cooperative, normal affect CARDIAC:  RRR, no peripheral edema PULM: Normal respiratory  effort, lungs CTA bilaterally, no wheezing ABDOMEN:  Nondistended, soft, nontender. No obvious masses, no hepatomegaly,  normal bowel sounds SKIN:  turgor, no lesions seen Musculoskeletal:  Normal muscle tone, normal strength NEURO: Alert and oriented x 3, no focal neurologic deficits   Krystal Gibson , NP 07/03/2019, 11:22 AM

## 2019-07-03 NOTE — Progress Notes (Signed)
Reviewed and agree with management plan.  Avyanna Spada T. Damarkus Balis, MD FACG [facility]  

## 2019-07-03 NOTE — Patient Instructions (Signed)
If you are age 66 or older, your body mass index should be between 23-30. Your Body mass index is 28.89 kg/m. If this is out of the aforementioned range listed, please consider follow up with your Primary Care Provider.  If you are age 55 or younger, your body mass index should be between 19-25. Your Body mass index is 28.89 kg/m. If this is out of the aformentioned range listed, please consider follow up with your Primary Care Provider.   You have been scheduled for an endoscopy. Please follow written instructions given to you at your visit today. If you use inhalers (even only as needed), please bring them with you on the day of your procedure. Your physician has requested that you go to www.startemmi.com and enter the access code given to you at your visit today. This web site gives a general overview about your procedure. However, you should still follow specific instructions given to you by our office regarding your preparation for the procedure.  Your provider has requested that you go to the basement level for lab work before leaving today. Press "B" on the elevator. The lab is located at the first door on the left as you exit the elevator.  Thank you for choosing me and [facility].   Tye Savoy, NP

## 2019-07-07 ENCOUNTER — Encounter: Payer: Self-pay | Admitting: Gastroenterology

## 2019-07-07 ENCOUNTER — Other Ambulatory Visit: Payer: Self-pay

## 2019-07-07 ENCOUNTER — Ambulatory Visit (AMBULATORY_SURGERY_CENTER): Payer: Medicare HMO | Admitting: Gastroenterology

## 2019-07-07 VITALS — BP 122/72 | HR 20 | Temp 97.5°F | Resp 18 | Ht 66.0 in | Wt 179.0 lb

## 2019-07-07 DIAGNOSIS — K2950 Unspecified chronic gastritis without bleeding: Secondary | ICD-10-CM

## 2019-07-07 DIAGNOSIS — K222 Esophageal obstruction: Secondary | ICD-10-CM | POA: Diagnosis not present

## 2019-07-07 DIAGNOSIS — R1013 Epigastric pain: Secondary | ICD-10-CM

## 2019-07-07 DIAGNOSIS — K31819 Angiodysplasia of stomach and duodenum without bleeding: Secondary | ICD-10-CM | POA: Diagnosis not present

## 2019-07-07 DIAGNOSIS — K449 Diaphragmatic hernia without obstruction or gangrene: Secondary | ICD-10-CM | POA: Diagnosis not present

## 2019-07-07 DIAGNOSIS — K259 Gastric ulcer, unspecified as acute or chronic, without hemorrhage or perforation: Secondary | ICD-10-CM | POA: Diagnosis not present

## 2019-07-07 DIAGNOSIS — E7800 Pure hypercholesterolemia, unspecified: Secondary | ICD-10-CM | POA: Diagnosis not present

## 2019-07-07 DIAGNOSIS — I10 Essential (primary) hypertension: Secondary | ICD-10-CM | POA: Diagnosis not present

## 2019-07-07 MED ORDER — PANTOPRAZOLE SODIUM 40 MG PO TBEC: 40.0000 mg | DELAYED_RELEASE_TABLET | Freq: Every day | ORAL | 3 refills | Status: DC

## 2019-07-07 MED ORDER — SODIUM CHLORIDE 0.9 % IV SOLN: 500.0000 mL | Freq: Once | INTRAVENOUS | Status: DC

## 2019-07-07 NOTE — Progress Notes (Signed)
Return to GI office in 6 weeks.

## 2019-07-07 NOTE — Progress Notes (Signed)
Called to room to assist during endoscopic procedure.  Patient ID and intended procedure confirmed with present staff. Received instructions for my participation in the procedure from the performing physician.  

## 2019-07-07 NOTE — Progress Notes (Signed)
Report given to PACU, vss 

## 2019-07-07 NOTE — Patient Instructions (Signed)
YOU HAD AN ENDOSCOPIC PROCEDURE TODAY AT THE [facility]:   Refer to the procedure report that was given to you for any specific questions about what was found during the examination.  If the procedure report does not answer your questions, please call your gastroenterologist to clarify.  If you requested that your care partner not be given the details of your procedure findings, then the procedure report has been included in a sealed envelope for you to review at your convenience later.  YOU SHOULD EXPECT: Some feelings of bloating in the abdomen. Passage of more gas than usual.  Walking can help get rid of the air that was put into your GI tract during the procedure and reduce the bloating. If you had a lower endoscopy (such as a colonoscopy or flexible sigmoidoscopy) you may notice spotting of blood in your stool or on the toilet paper. If you underwent a bowel prep for your procedure, you may not have a normal bowel movement for a few days.  Please Note:  You might notice some irritation and congestion in your nose or some drainage.  This is from the oxygen used during your procedure.  There is no need for concern and it should clear up in a day or so.  SYMPTOMS TO REPORT IMMEDIATELY:    Following upper endoscopy (EGD)  Vomiting of blood or coffee ground material  New chest pain or pain under the shoulder blades  Painful or persistently difficult swallowing  New shortness of breath  Fever of 100F or higher  Black, tarry-looking stools  For urgent or emergent issues, a gastroenterologist can be reached at any hour by calling [phone]. Do not use MyChart messaging for urgent concerns.    DIET:  We do recommend a small meal at first, but then you may proceed to your regular diet.  Drink plenty of fluids but you should avoid alcoholic beverages for 24 hours.  ACTIVITY:  You should plan to take it easy for the rest of today and you should NOT DRIVE or use heavy machinery  until tomorrow (because of the sedation medicines used during the test).    FOLLOW UP: Our staff will call the number listed on your records 48-72 hours following your procedure to check on you and address any questions or concerns that you may have regarding the information given to you following your procedure. If we do not reach you, we will leave a message.  We will attempt to reach you two times.  During this call, we will ask if you have developed any symptoms of COVID 19. If you develop any symptoms (ie: fever, flu-like symptoms, shortness of breath, cough etc.) before then, please call [phone].  If you test positive for Covid 19 in the 2 weeks post procedure, please call and report this information to us.    If any biopsies were taken you will be contacted by phone or by letter within the next 1-3 weeks.  Please call us at [phone] if you have not heard about the biopsies in 3 weeks.    SIGNATURES/CONFIDENTIALITY: You and/or your care partner have signed paperwork which will be entered into your electronic medical record.  These signatures attest to the fact that that the information above on your After Visit Summary has been reviewed and is understood.  Full responsibility of the confidentiality of this discharge information lies with you and/or your care-partner. 

## 2019-07-07 NOTE — Op Note (Signed)
Krystal Gibson Patient Name: Arushi Ansara Procedure Date: 07/07/2019 10:09 AM MRN: [id number] Endoscopist: Ladene Artist , MD Age: 64 Referring MD:  Date of Birth: 15-Sep-1955 Gender: Female Account #: [id number] Procedure:                Upper GI endoscopy Indications:              Epigastric abdominal pain Medicines:                Monitored Anesthesia Care Procedure:                Pre-Anesthesia Assessment:                           - Prior to the procedure, a History and Physical                            was performed, and patient medications and                            allergies were reviewed. The patient's tolerance of                            previous anesthesia was also reviewed. The risks                            and benefits of the procedure and the sedation                            options and risks were discussed with the patient.                            All questions were answered, and informed consent                            was obtained. Prior Anticoagulants: The patient has                            taken no previous anticoagulant or antiplatelet                            agents. ASA Grade Assessment: II - A patient with                            mild systemic disease. After reviewing the risks                            and benefits, the patient was deemed in                            satisfactory condition to undergo the procedure.                           After obtaining informed consent, the endoscope was  passed under direct vision. Throughout the                            procedure, the patient's blood pressure, pulse, and                            oxygen saturations were monitored continuously. The                            Endoscope was introduced through the mouth, and                            advanced to the second part of duodenum. The upper                            GI endoscopy was  accomplished without difficulty.                            The patient tolerated the procedure well. Scope In: Scope Out: Findings:                 One benign-appearing, intrinsic mild stenosis was                            found 35 cm from the incisors. This stenosis                            measured 1.5 cm (inner diameter) x less than one cm                            (in length). The stenosis was traversed.                           Localized small arteriovenous malformation in the                            mid esophagus.                           The exam of the esophagus was otherwise normal.                           A few localized medium erosions with no bleeding                            and no stigmata of recent bleeding were found in                            the gastric body. Biopsies were taken with a cold                            forceps for histology.                           Patchy  mildly erythematous mucosa without bleeding                            was found in the gastric body and in the gastric                            antrum. Biopsies were taken with a cold forceps for                            histology.                           A medium-sized hiatal hernia was present.                           The exam of the stomach was otherwise normal.                           The duodenal bulb and second portion of the                            duodenum were normal. Complications:            No immediate complications. Estimated Blood Loss:     Estimated blood loss was minimal. Impression:               - Benign-appearing esophageal stenosis.                           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                           - Erosive gastropathy with no bleeding and no                            stigmata of recent bleeding. Biopsied.                           - Erythematous mucosa in the gastric body and                            antrum.  Biopsied.                           - Medium-sized hiatal hernia.                           - Normal duodenal bulb and second portion of the                            duodenum. Recommendation:           - Patient has a contact number available for                            emergencies. The signs and symptoms of potential  delayed complications were discussed with the                            patient. Return to normal activities tomorrow.                            Written discharge instructions were provided to the                            patient.                           - Resume previous diet.                           - Continue present medications.                           - Antireflux measures.                           - Await pathology results.                           - Protonix (pantoprazole) 40 mg PO daily, 1 year of                            refills.                           - Return to GI office in 6 weeks. Ladene Artist, MD 07/07/2019 10:24:36 AM This report has been signed electronically.

## 2019-07-07 NOTE — Progress Notes (Signed)
Temp-JR VS-DT

## 2019-07-09 ENCOUNTER — Telehealth: Payer: Self-pay

## 2019-07-09 NOTE — Telephone Encounter (Signed)
  Follow up Call-  Call back number 07/07/2019 05/07/2017  Post procedure Call Back phone  # [phone]  Permission to leave phone message Yes Yes  Some recent data might be hidden     Patient questions:  Do you have a fever, pain , or abdominal swelling? No. Pain Score  0 *  Have you tolerated food without any problems? Yes.    Have you been able to return to your normal activities? Yes.    Do you have any questions about your discharge instructions: Diet   No. Medications  No. Follow up visit  No.  Do you have questions or concerns about your Care? No.  Actions: * If pain score is 4 or above: 1. No action needed, pain <4.Have you developed a fever since your procedure? no  2.   Have you had an respiratory symptoms (SOB or cough) since your procedure? no  3.   Have you tested positive for COVID 19 since your procedure no  4.   Have you had any family members/close contacts diagnosed with the COVID 19 since your procedure?  no   If yes to any of these questions please route to Joylene John, RN and Alphonsa Gin, Therapist, sports.

## 2019-07-19 ENCOUNTER — Encounter: Payer: Self-pay | Admitting: Gastroenterology

## 2019-07-22 DIAGNOSIS — E782 Mixed hyperlipidemia: Secondary | ICD-10-CM | POA: Diagnosis not present

## 2019-07-23 LAB — LIPID PANEL
Chol/HDL Ratio: 5.2 ratio — ABNORMAL HIGH (ref 0.0–4.4)
Cholesterol, Total: 223 mg/dL — ABNORMAL HIGH (ref 100–199)
HDL: 43 mg/dL (ref >39)
LDL Chol Calc (NIH): 154 mg/dL — ABNORMAL HIGH (ref 0–99)
Triglycerides: 143 mg/dL (ref 0–149)
VLDL Cholesterol Cal: 26 mg/dL (ref 5–40)

## 2019-07-28 ENCOUNTER — Encounter: Payer: Self-pay | Admitting: Cardiology

## 2019-07-31 ENCOUNTER — Ambulatory Visit: Payer: Medicare HMO | Admitting: Cardiology

## 2019-08-13 DIAGNOSIS — R69 Illness, unspecified: Secondary | ICD-10-CM | POA: Diagnosis not present

## 2019-08-17 DIAGNOSIS — R7303 Prediabetes: Secondary | ICD-10-CM | POA: Diagnosis not present

## 2019-08-17 DIAGNOSIS — I10 Essential (primary) hypertension: Secondary | ICD-10-CM | POA: Diagnosis not present

## 2019-08-17 DIAGNOSIS — K582 Mixed irritable bowel syndrome: Secondary | ICD-10-CM | POA: Diagnosis not present

## 2019-08-17 DIAGNOSIS — K219 Gastro-esophageal reflux disease without esophagitis: Secondary | ICD-10-CM | POA: Diagnosis not present

## 2019-08-17 DIAGNOSIS — E7800 Pure hypercholesterolemia, unspecified: Secondary | ICD-10-CM | POA: Diagnosis not present

## 2019-08-18 ENCOUNTER — Encounter: Payer: Self-pay | Admitting: Family Medicine

## 2019-08-18 ENCOUNTER — Encounter: Payer: Self-pay | Admitting: Cardiology

## 2019-09-09 DIAGNOSIS — H6982 Other specified disorders of Eustachian tube, left ear: Secondary | ICD-10-CM | POA: Diagnosis not present

## 2019-10-21 ENCOUNTER — Encounter: Payer: Self-pay | Admitting: Gastroenterology

## 2019-10-21 ENCOUNTER — Ambulatory Visit: Payer: Medicare HMO | Admitting: Gastroenterology

## 2019-10-21 VITALS — BP 130/72 | HR 76 | Ht 66.0 in | Wt 181.2 lb

## 2019-10-21 DIAGNOSIS — K3189 Other diseases of stomach and duodenum: Secondary | ICD-10-CM

## 2019-10-21 DIAGNOSIS — M5412 Radiculopathy, cervical region: Secondary | ICD-10-CM | POA: Diagnosis not present

## 2019-10-21 DIAGNOSIS — K219 Gastro-esophageal reflux disease without esophagitis: Secondary | ICD-10-CM

## 2019-10-21 DIAGNOSIS — K222 Esophageal obstruction: Secondary | ICD-10-CM

## 2019-10-21 NOTE — Progress Notes (Signed)
    History of Present Illness: This is a 64 year old female returning for follow-up of erosive gastritis, GERD and IBS. She has no gastrointestinal complaints. Glycopyrrolate once daily has been very effective at controlling her abdominal pain. No reflux symptoms or dysphagia. Her bowel movements are regular.  EGD 06/2019 - Benign-appearing esophageal stenosis. 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 - Erosive gastropathy with no bleeding and no stigmata of recent bleeding. Biopsied. - Erythematous mucosa in the gastric body and antrum. Biopsied. - Medium-sized hiatal hernia. - Normal duodenal bulb and second portion of the duodenum.  Current Medications, Allergies, Past Medical History, Past Surgical History, Family History and Social History were reviewed in [employer] record.   Physical Exam: General: Well developed, well nourished, no acute distress Head: Normocephalic and atraumatic Eyes:  sclerae anicteric, EOMI Ears: Normal auditory acuity Mouth: Not examined, mask on during Covid-19 pandemic Lungs: Clear throughout to auscultation Heart: Regular rate and rhythm; no murmurs, rubs or bruits Abdomen: Soft, non tender and non distended. No masses, hepatosplenomegaly or hernias noted. Normal Bowel sounds Rectal: Not done Musculoskeletal: Symmetrical with no gross deformities  Pulses:  Normal pulses noted Extremities: No clubbing, cyanosis, edema or deformities noted Neurological: Alert oriented x 4, grossly nonfocal Psychological:  Alert and cooperative. Normal mood and affect   Assessment and Recommendations:  1. GERD with esophageal stricture, medium sized HH and erosive gastropathy. Follow standard antireflux measures. Continue pantoprazole 40 mg p.o. daily long-term. REV in 1 year.  2. IBS with alternating pattern. Recently bowel movements have been regular. Continue glycopyrrolate 1 mg p.o. twice daily as needed. REV in 1 year.

## 2019-10-21 NOTE — Patient Instructions (Signed)
Thank you for choosing me and [facility].  Malcolm T. Stark, Jr., MD., FACG  

## 2019-10-29 DIAGNOSIS — M25512 Pain in left shoulder: Secondary | ICD-10-CM | POA: Diagnosis not present

## 2019-10-29 DIAGNOSIS — M5412 Radiculopathy, cervical region: Secondary | ICD-10-CM | POA: Diagnosis not present

## 2019-11-06 DIAGNOSIS — M5000 Cervical disc disorder with myelopathy, unspecified cervical region: Secondary | ICD-10-CM | POA: Diagnosis not present

## 2019-11-09 ENCOUNTER — Other Ambulatory Visit: Payer: Self-pay | Admitting: Sports Medicine

## 2019-11-09 DIAGNOSIS — M50222 Other cervical disc displacement at C5-C6 level: Secondary | ICD-10-CM

## 2019-11-16 ENCOUNTER — Ambulatory Visit
Admission: RE | Admit: 2019-11-16 | Discharge: 2019-11-16 | Disposition: A | Discharge status: Home / Self Care | Payer: Medicare HMO | Source: Ambulatory Visit | Attending: Sports Medicine | Admitting: Sports Medicine

## 2019-11-16 ENCOUNTER — Other Ambulatory Visit: Payer: Medicare HMO

## 2019-11-16 ENCOUNTER — Other Ambulatory Visit: Payer: Self-pay

## 2019-11-16 DIAGNOSIS — M47812 Spondylosis without myelopathy or radiculopathy, cervical region: Secondary | ICD-10-CM | POA: Diagnosis not present

## 2019-11-16 DIAGNOSIS — M50222 Other cervical disc displacement at C5-C6 level: Secondary | ICD-10-CM

## 2019-11-16 MED ORDER — IOPAMIDOL (ISOVUE-M 300) INJECTION 61%
1.0000 mL | Freq: Once | INTRAMUSCULAR | Status: AC
  Administered 2019-11-16: 1 mL via EPIDURAL

## 2019-11-16 MED ORDER — DIAZEPAM 5 MG PO TABS
5.0000 mg | ORAL_TABLET | Freq: Once | ORAL | Status: AC
  Administered 2019-11-16: 5 mg via ORAL

## 2019-11-16 MED ORDER — TRIAMCINOLONE ACETONIDE 40 MG/ML IJ SUSP (RADIOLOGY)
60.0000 mg | Freq: Once | INTRAMUSCULAR | Status: AC
  Administered 2019-11-16: 60 mg via EPIDURAL

## 2019-11-16 NOTE — Discharge Instructions (Signed)

## 2019-12-15 DIAGNOSIS — M5412 Radiculopathy, cervical region: Secondary | ICD-10-CM | POA: Diagnosis not present

## 2019-12-15 DIAGNOSIS — M542 Cervicalgia: Secondary | ICD-10-CM | POA: Diagnosis not present

## 2019-12-15 DIAGNOSIS — M47892 Other spondylosis, cervical region: Secondary | ICD-10-CM | POA: Diagnosis not present

## 2019-12-15 DIAGNOSIS — Z6828 Body mass index (BMI) 28.0-28.9, adult: Secondary | ICD-10-CM | POA: Diagnosis not present

## 2020-01-14 DIAGNOSIS — M5412 Radiculopathy, cervical region: Secondary | ICD-10-CM | POA: Diagnosis not present

## 2020-01-14 DIAGNOSIS — M7918 Myalgia, other site: Secondary | ICD-10-CM | POA: Diagnosis not present

## 2020-01-14 DIAGNOSIS — M542 Cervicalgia: Secondary | ICD-10-CM | POA: Diagnosis not present

## 2020-01-14 DIAGNOSIS — M5414 Radiculopathy, thoracic region: Secondary | ICD-10-CM | POA: Diagnosis not present

## 2020-02-17 DIAGNOSIS — K219 Gastro-esophageal reflux disease without esophagitis: Secondary | ICD-10-CM | POA: Diagnosis not present

## 2020-02-17 DIAGNOSIS — I10 Essential (primary) hypertension: Secondary | ICD-10-CM | POA: Diagnosis not present

## 2020-02-17 DIAGNOSIS — M5412 Radiculopathy, cervical region: Secondary | ICD-10-CM | POA: Diagnosis not present

## 2020-02-17 DIAGNOSIS — E7800 Pure hypercholesterolemia, unspecified: Secondary | ICD-10-CM | POA: Diagnosis not present

## 2020-02-17 DIAGNOSIS — R7303 Prediabetes: Secondary | ICD-10-CM | POA: Diagnosis not present

## 2020-02-18 DIAGNOSIS — M5412 Radiculopathy, cervical region: Secondary | ICD-10-CM | POA: Diagnosis not present

## 2020-02-18 DIAGNOSIS — M542 Cervicalgia: Secondary | ICD-10-CM | POA: Diagnosis not present

## 2020-02-18 DIAGNOSIS — M791 Myalgia, unspecified site: Secondary | ICD-10-CM | POA: Diagnosis not present

## 2020-02-25 ENCOUNTER — Other Ambulatory Visit: Payer: Self-pay

## 2020-02-25 ENCOUNTER — Ambulatory Visit: Payer: Medicare HMO | Admitting: Cardiology

## 2020-02-25 ENCOUNTER — Encounter: Payer: Self-pay | Admitting: Cardiology

## 2020-02-25 VITALS — BP 141/81 | HR 83 | Resp 16 | Ht 66.0 in | Wt 180.0 lb

## 2020-02-25 DIAGNOSIS — E782 Mixed hyperlipidemia: Secondary | ICD-10-CM | POA: Diagnosis not present

## 2020-02-25 DIAGNOSIS — R931 Abnormal findings on diagnostic imaging of heart and coronary circulation: Secondary | ICD-10-CM

## 2020-02-25 DIAGNOSIS — I10 Essential (primary) hypertension: Secondary | ICD-10-CM | POA: Diagnosis not present

## 2020-02-25 MED ORDER — EZETIMIBE 10 MG PO TABS: 10.0000 mg | ORAL_TABLET | Freq: Every day | ORAL | 3 refills | Status: DC

## 2020-02-25 NOTE — Progress Notes (Signed)
Follow up visit  Subjective:   Krystal Gibson, female    DOB: [birth year], 64 y.o.   MRN: [id number]    Chief Complaint  Patient presents with  . Hypertension  . Hyperlipidemia  . Follow-up    3 month  . Results     HPI  64 year old African American female with hypertension, hyperlipidemia, family h/oCAD,  irritable bowel syndrome.  Blood pressure in 130s/80. Lipid panel remains unfavorable. She is currently using Crestor 20 mg daily. Calcium score is elevated. She denies chest pain, shortness of breath, palpitations, leg edema, orthopnea, PND, TIA/syncope.  She has not been able to tolerate lipitor, or doses higher than 20 mg Crestor due to side effects. She is unwilling to try injectable medication at this time.   Lately, she has been dealing with cervical radiculopathy pain, currently on pregabalin.   Current Outpatient Medications on File Prior to Visit  Medication Sig Dispense Refill  . amLODipine (NORVASC) 5 MG tablet Take 1 tablet (5 mg total) by mouth daily. 90 tablet 1  . cholecalciferol (VITAMIN D) 1000 UNITS tablet Take 1,000 Units by mouth daily.    Marland Kitchen glycopyrrolate (ROBINUL) 1 MG tablet Take 1 tablet (1 mg total) by mouth 2 (two) times daily. 60 tablet 11  . lisinopril-hydrochlorothiazide (ZESTORETIC) 10-12.5 MG tablet Take 1 tablet by mouth daily. 90 tablet 1  . pantoprazole (PROTONIX) 40 MG tablet Take 1 tablet (40 mg total) by mouth daily. 90 tablet 3  . pregabalin (LYRICA) 50 MG capsule Take 50 mg by mouth 2 (two) times daily.    . rosuvastatin (CRESTOR) 20 MG tablet Take 1 tablet (20 mg total) by mouth daily. 90 tablet 3   No current facility-administered medications on file prior to visit.  PICC stopped  Cardiovascular studies:  CT cardiac scoring 03/2019: Calcium score: 159. LM: 0. LAD: 67. Cx: 37. RCA: 55  EKG 12/24/2018: Sinus rhythm 62 bpm. Possible old inferior infarct.   Exercise Treadmill Stress Test 05/09/2018:  Indication:  CP  The patient exercised on Bruce protocol for 5:21 min. Patient achieved 6.37 METS and reached HR 115 bpm, which is 73% of maximum age-predicted HR. Stress test terminated due to Leg discomfort and fatigue.  Exercise capacity was below average for age. HR Response to Exercise: Suboptimal due to low exercise capacity.  BP Response to Exercise: Normal resting BP- appropriate response. Chest Pain: none. Arrhythmias: none.  Resting EKG demonstrates Normal sinus rhythm. ST Changes: With peak exercise there was no ST-T changes of ischemia. However, this is submaximal stress test due to poor exercise tolerance.    Overall Impression: Inconclusive, submaximal stress test due to low exercise capacity.  Recommendation: Consider alternate ischemia testing, if clincial sucpision of CAD is high.   Echocardiogram 04/11/2018: Left ventricle cavity is normal in size. Moderate concentric hypertrophy of the left ventricle. Normal global wall motion. Doppler evidence of grade I (impaired) diastolic dysfunction, normal LAP. Calculated EF 63%. No significant valvular abnormality. Inadequate TR jet to estimate PA systolic pressure. Normal right atrial pressure.  Labs: 07/22/2019: Chol 223, TG 143, HDL 43, LDL 154  09/04/2018: Chol 203, TG 104, HDL 38, LDL 144  02/23/2019: Chol 234, TG 94, HDL 47, LDL 170  04/08/2018: Glucose 97. BUN/Cr 14/0.72. eGFR 89. Na/K 143/3.6. AlKP 125 mildly elevated. Rest of the CMP normal.  Chol 262, TG 102, HDL 41, LDL 201 HbAC 5.8%    Review of Systems  Cardiovascular: Negative for chest pain, dyspnea on exertion, leg swelling, palpitations  and syncope.        Vitals:   02/25/20 1456  BP: (!) 141/81  Pulse: 83  Resp: 16  SpO2: 100%     Objective:   Physical Exam Vitals and nursing note reviewed.  Constitutional:      General: She is not in acute distress. Neck:     Vascular: No JVD.  Cardiovascular:     Rate and Rhythm: Normal rate and  regular rhythm.     Heart sounds: Normal heart sounds. No murmur heard.   Pulmonary:     Effort: Pulmonary effort is normal.     Breath sounds: Normal breath sounds. No wheezing or rales.         Assessment & Recommendations:   64 year old [name] female with hypertension, hyperlipidemia, irritable bowel syndrome.  Hypertension: Well controlled on lisinopril-HCTZ 10-12.5 mg daily and amlodipine 5 mg daily.  Hyperlipidemia: Remains suboptimal. Unable to tolerate lipitor, or more than Crestor 20 mg daily dose. Calcium score is elevated, suggesting subclinical coronary artery disease. I do not think lower intensity statin will be adequate. She is not interested in injectable lipid lowering therapy. She could not afford bempedoic acid + ezetimibe 180-10 mg daily. Added Zetia 10 mg. Repea tlipid panel in 6 months  F/u in 6 months  Jivan Symanski Esther Hardy, MD [facility] [place] Pager: [phone] Office: [phone] If no answer Cell [phone]

## 2020-03-31 DIAGNOSIS — Z6828 Body mass index (BMI) 28.0-28.9, adult: Secondary | ICD-10-CM | POA: Diagnosis not present

## 2020-03-31 DIAGNOSIS — M542 Cervicalgia: Secondary | ICD-10-CM | POA: Diagnosis not present

## 2020-03-31 DIAGNOSIS — M5412 Radiculopathy, cervical region: Secondary | ICD-10-CM | POA: Diagnosis not present

## 2020-04-04 DIAGNOSIS — H5203 Hypermetropia, bilateral: Secondary | ICD-10-CM | POA: Diagnosis not present

## 2020-04-20 ENCOUNTER — Ambulatory Visit: Payer: Medicare HMO | Admitting: Podiatry

## 2020-04-20 ENCOUNTER — Other Ambulatory Visit: Payer: Self-pay

## 2020-04-20 ENCOUNTER — Encounter: Payer: Self-pay | Admitting: Podiatry

## 2020-04-20 ENCOUNTER — Ambulatory Visit (INDEPENDENT_AMBULATORY_CARE_PROVIDER_SITE_OTHER): Payer: Medicare HMO

## 2020-04-20 DIAGNOSIS — M7750 Other enthesopathy of unspecified foot: Secondary | ICD-10-CM

## 2020-04-20 DIAGNOSIS — M7752 Other enthesopathy of left foot: Secondary | ICD-10-CM

## 2020-04-20 DIAGNOSIS — M7662 Achilles tendinitis, left leg: Secondary | ICD-10-CM | POA: Diagnosis not present

## 2020-04-20 MED ORDER — TRIAMCINOLONE ACETONIDE 10 MG/ML IJ SUSP
10.0000 mg | Freq: Once | INTRAMUSCULAR | Status: AC
  Administered 2020-04-20: 10 mg

## 2020-04-20 NOTE — Patient Instructions (Signed)

## 2020-04-20 NOTE — Progress Notes (Signed)
Subjective:   Patient ID: Almetta Lovely, female   DOB: 64 y.o.   MRN: [id number]   HPI Patient presents stating she has had a lot of pain in the back of her left heel for about a month.  States it sore when she tries to do any form of activity and she is trying to be active but has not been able to.  Patient smokes quarter pack per day and would like to be more active   Review of Systems  All other systems reviewed and are negative.       Objective:  Physical Exam Vitals and nursing note reviewed.  Constitutional:      Appearance: She is well-developed and well-nourished.  Cardiovascular:     Pulses: Intact distal pulses.  Pulmonary:     Effort: Pulmonary effort is normal.  Musculoskeletal:        General: Normal range of motion.  Skin:    General: Skin is warm.  Neurological:     Mental Status: She is alert.     Neurovascular status intact muscle strength found to be adequate range of motion adequate with exquisite discomfort in the posterior aspect of the left heel lateral side with pain at the insertion of the Achilles tendon into the calcaneus.  No indications of equinus or muscle strength loss at this time     Assessment:  Acute Achilles tendinitis left with inflammation pain present     Plan:  H&P reviewed condition and at this point carefully did a sterile prep and injected the lateral side 3 mg dexamethasone Kenalog 5 mg Xylocaine.  Before doing this I did explain the chances for rupture with tendon and she was willing to accept risk I also went ahead today and I applied air fracture walker to completely immobilize and allow her to rest  X-rays indicate no current spur or signs of stress fracture associated with condition

## 2020-04-22 DIAGNOSIS — H52223 Regular astigmatism, bilateral: Secondary | ICD-10-CM | POA: Diagnosis not present

## 2020-04-22 DIAGNOSIS — H524 Presbyopia: Secondary | ICD-10-CM | POA: Diagnosis not present

## 2020-05-11 ENCOUNTER — Ambulatory Visit (INDEPENDENT_AMBULATORY_CARE_PROVIDER_SITE_OTHER): Payer: Medicare HMO | Admitting: Podiatry

## 2020-05-11 ENCOUNTER — Other Ambulatory Visit: Payer: Self-pay

## 2020-05-11 ENCOUNTER — Encounter: Payer: Self-pay | Admitting: Podiatry

## 2020-05-11 DIAGNOSIS — L600 Ingrowing nail: Secondary | ICD-10-CM

## 2020-05-11 DIAGNOSIS — M7662 Achilles tendinitis, left leg: Secondary | ICD-10-CM

## 2020-05-11 HISTORY — PX: TOENAIL EXCISION: SUR558

## 2020-05-11 NOTE — Progress Notes (Signed)
Subjective:   Patient ID: Krystal Gibson, female   DOB: 65 y.o.   MRN: [id number]   HPI Patient presents stating she is getting some provement in the back of her left heel but still present and her right big toenail has really thickened with recording painful and she would like to have it removed like her other toenail   ROS      Objective:  Physical Exam  Neurovascular status intact with patient found to have significant diminishment of discomfort posterior aspect left heel with pain still present upon deep palpation but overall better with good range of motion and no splinting currently she is still wearing the boot and on the right hallux and noted to be very thickened dystrophic     Assessment:  Achilles tendinitis left improving but still present with a thickened mycotic nail infection right painful     Plan:  H&P reviewed conditions and for the nail I recommended removal educated her on this allowed her to sign consent form explaining risk and today I infiltrated 60 mg like Marcaine mixture.  Sterile prep done and using sterile instrumentation remove the nail exposed matrix applied phenol 3 applications 30 seconds followed by alcohol of sterile dressing and gave instructions on soaks leave dressing on 24 hours but take it off earlier if any throbbing were to occur.  For the left I encouraged gradual reduction of the boot discussed the type of shoes I want her to wear discussed continued oral medications and topical medications to use

## 2020-05-11 NOTE — Patient Instructions (Signed)

## 2020-05-16 ENCOUNTER — Other Ambulatory Visit: Payer: Self-pay | Admitting: Family Medicine

## 2020-05-16 DIAGNOSIS — Z1231 Encounter for screening mammogram for malignant neoplasm of breast: Secondary | ICD-10-CM

## 2020-05-23 ENCOUNTER — Telehealth: Payer: Self-pay | Admitting: Podiatry

## 2020-05-23 ENCOUNTER — Encounter: Payer: Self-pay | Admitting: Podiatry

## 2020-05-23 ENCOUNTER — Ambulatory Visit: Payer: Medicare HMO | Admitting: Podiatry

## 2020-05-23 ENCOUNTER — Other Ambulatory Visit: Payer: Self-pay

## 2020-05-23 DIAGNOSIS — L03031 Cellulitis of right toe: Secondary | ICD-10-CM

## 2020-05-23 DIAGNOSIS — M7662 Achilles tendinitis, left leg: Secondary | ICD-10-CM | POA: Diagnosis not present

## 2020-05-23 MED ORDER — DOXYCYCLINE HYCLATE 100 MG PO TABS
100.0000 mg | ORAL_TABLET | Freq: Two times a day (BID) | ORAL | 0 refills | Status: DC
Start: 2020-05-23 — End: 2020-08-29

## 2020-05-23 NOTE — Progress Notes (Signed)
Subjective:   Patient ID: Krystal Gibson, female   DOB: 65 y.o.   MRN: [id number]   HPI Patient is concerned about some redness around the big toe right where previous nail was removed and also is noted to have Achilles tendon left improved but still present   ROS      Objective:  Physical Exam  Neurovascular status unchanged with right hallux nailbed showing redness on the dorsal surface localized that is not spreading proximal with edema left posterior heel mild in intensity     Assessment:  Low-grade paronychia right hallux with Achilles tendinitis left     Plan:  H&P reviewed both conditions and I do think the right hallux nail heal uneventfully and I advised on padding cushioning type treatment gradual return to normal shoe gear and that it may take another couple weeks to completely dry out.  I recommend for the left continued stretching exercises and heels to prevent reoccurrence Achilles tendinitis

## 2020-05-25 ENCOUNTER — Other Ambulatory Visit: Payer: Self-pay | Admitting: Cardiology

## 2020-05-25 DIAGNOSIS — E782 Mixed hyperlipidemia: Secondary | ICD-10-CM

## 2020-05-26 DIAGNOSIS — M542 Cervicalgia: Secondary | ICD-10-CM | POA: Diagnosis not present

## 2020-05-26 DIAGNOSIS — M5412 Radiculopathy, cervical region: Secondary | ICD-10-CM | POA: Diagnosis not present

## 2020-05-26 DIAGNOSIS — I10 Essential (primary) hypertension: Secondary | ICD-10-CM | POA: Diagnosis not present

## 2020-05-31 ENCOUNTER — Telehealth: Payer: Self-pay | Admitting: *Deleted

## 2020-05-31 NOTE — Telephone Encounter (Signed)
That's fine

## 2020-05-31 NOTE — Telephone Encounter (Signed)
Patient is requesting a handicap sticker since she is still having problems with the surgery toe (Jan. 19),thinks that it is not healing properly. She is having trouble driving, getting around,only has 4 more days of antibiotics remaining.Please advise.

## 2020-06-01 ENCOUNTER — Telehealth: Payer: Self-pay | Admitting: Podiatry

## 2020-06-01 NOTE — Telephone Encounter (Signed)
Called patient and informed that letter would be ready at front desk.

## 2020-06-01 NOTE — Telephone Encounter (Signed)
Informed patient thru [place] concerning approval for handicap sticker.

## 2020-06-01 NOTE — Telephone Encounter (Signed)
Can you give this patient a call tomorrow please. Thanks

## 2020-06-01 NOTE — Telephone Encounter (Signed)
Pt called and stated that she has an infection in her right great toe/puss is coming out. She states the top of thew toe is black and red. I offered an appointment but she would like someone to give her a call.

## 2020-06-02 ENCOUNTER — Telehealth: Payer: Self-pay | Admitting: Podiatry

## 2020-06-02 NOTE — Telephone Encounter (Signed)
Looks like it is healing fine. If draining next week she can come in for check

## 2020-06-02 NOTE — Telephone Encounter (Signed)
For Dr. Mellody Drown review Patient: Masa Chilcott-DOB-12/04/2055 Patient surgery 05/11/2020  Constant pain when trying to walk (Both feet) Achillis ankle is acting up again.  Toe: Discoloration at the base of toe (Dark & red) Swollen--Gets irritated when I try to drive               (Sore & painful to touch & sharp pains)  Ankle: Painful walking without shoes or boot  I completed the Doxycycline as of this morning (10 days twice a day) Date of toe Pics are labeled 05/30/20, 05/31/20 & 06/02/20 @ 4:00pm  Suggestions or comments are welcome!  Thank you, Richardson Landry

## 2020-06-03 NOTE — Telephone Encounter (Signed)
Left voicemail for patient

## 2020-06-06 DIAGNOSIS — M542 Cervicalgia: Secondary | ICD-10-CM | POA: Diagnosis not present

## 2020-06-06 NOTE — Telephone Encounter (Signed)
Thank you :)

## 2020-06-08 ENCOUNTER — Ambulatory Visit: Payer: Medicare HMO | Admitting: Podiatry

## 2020-06-08 ENCOUNTER — Other Ambulatory Visit: Payer: Self-pay

## 2020-06-08 ENCOUNTER — Encounter: Payer: Self-pay | Admitting: Podiatry

## 2020-06-08 DIAGNOSIS — L03031 Cellulitis of right toe: Secondary | ICD-10-CM | POA: Diagnosis not present

## 2020-06-08 NOTE — Progress Notes (Signed)
Subjective:   Patient ID: Krystal Gibson, female   DOB: 65 y.o.   MRN: [id number]   HPI Patient presents stating that she just was concerned about her right big toe she took the antibiotics are still some darkness and crusted tissue and very occasional drainage and discomfort   ROS      Objective:  Physical Exam  Neuro vascular status intact with irritation of the proximal tissue of the right hallux nail after having permanent procedure but localized with no proximal edema erythema drainage noted     Assessment:  May have very light paronychia versus just crusted tissue and reactivity secondary to chemical application      Plan:  H&P condition reviewed continue soaks continue bandage usage and will try to hold off on any antibiotics and this should heal uneventfully.  Patient will be seen back to recheck as needed

## 2020-06-15 DIAGNOSIS — M542 Cervicalgia: Secondary | ICD-10-CM | POA: Diagnosis not present

## 2020-06-22 DIAGNOSIS — M542 Cervicalgia: Secondary | ICD-10-CM | POA: Diagnosis not present

## 2020-06-28 ENCOUNTER — Inpatient Hospital Stay: Admission: RE | Admit: 2020-06-28 | Payer: Medicare HMO | Source: Ambulatory Visit

## 2020-06-29 DIAGNOSIS — M542 Cervicalgia: Secondary | ICD-10-CM | POA: Diagnosis not present

## 2020-06-30 DIAGNOSIS — M67872 Other specified disorders of synovium, left ankle and foot: Secondary | ICD-10-CM | POA: Diagnosis not present

## 2020-06-30 DIAGNOSIS — L603 Nail dystrophy: Secondary | ICD-10-CM | POA: Diagnosis not present

## 2020-06-30 DIAGNOSIS — M79671 Pain in right foot: Secondary | ICD-10-CM | POA: Diagnosis not present

## 2020-06-30 DIAGNOSIS — R69 Illness, unspecified: Secondary | ICD-10-CM | POA: Diagnosis not present

## 2020-07-06 DIAGNOSIS — R2231 Localized swelling, mass and lump, right upper limb: Secondary | ICD-10-CM | POA: Diagnosis not present

## 2020-07-06 DIAGNOSIS — M25511 Pain in right shoulder: Secondary | ICD-10-CM | POA: Diagnosis not present

## 2020-07-07 ENCOUNTER — Other Ambulatory Visit: Payer: Self-pay | Admitting: Physician Assistant

## 2020-07-07 DIAGNOSIS — M25811 Other specified joint disorders, right shoulder: Secondary | ICD-10-CM

## 2020-07-12 ENCOUNTER — Ambulatory Visit
Admission: RE | Admit: 2020-07-12 | Discharge: 2020-07-12 | Disposition: A | Discharge status: Home / Self Care | Payer: Medicare HMO | Source: Ambulatory Visit | Attending: Physician Assistant | Admitting: Physician Assistant

## 2020-07-12 DIAGNOSIS — M25811 Other specified joint disorders, right shoulder: Secondary | ICD-10-CM

## 2020-07-12 DIAGNOSIS — R2241 Localized swelling, mass and lump, right lower limb: Secondary | ICD-10-CM | POA: Diagnosis not present

## 2020-07-12 DIAGNOSIS — R2231 Localized swelling, mass and lump, right upper limb: Secondary | ICD-10-CM | POA: Diagnosis not present

## 2020-07-13 DIAGNOSIS — M542 Cervicalgia: Secondary | ICD-10-CM | POA: Diagnosis not present

## 2020-07-14 DIAGNOSIS — M67872 Other specified disorders of synovium, left ankle and foot: Secondary | ICD-10-CM | POA: Diagnosis not present

## 2020-07-14 DIAGNOSIS — L603 Nail dystrophy: Secondary | ICD-10-CM | POA: Diagnosis not present

## 2020-07-20 DIAGNOSIS — M25511 Pain in right shoulder: Secondary | ICD-10-CM | POA: Diagnosis not present

## 2020-07-21 DIAGNOSIS — M5412 Radiculopathy, cervical region: Secondary | ICD-10-CM | POA: Diagnosis not present

## 2020-07-21 DIAGNOSIS — M7551 Bursitis of right shoulder: Secondary | ICD-10-CM | POA: Diagnosis not present

## 2020-07-21 DIAGNOSIS — M7552 Bursitis of left shoulder: Secondary | ICD-10-CM | POA: Diagnosis not present

## 2020-07-21 DIAGNOSIS — M542 Cervicalgia: Secondary | ICD-10-CM | POA: Diagnosis not present

## 2020-08-04 ENCOUNTER — Encounter: Payer: Self-pay | Admitting: Gastroenterology

## 2020-08-04 ENCOUNTER — Other Ambulatory Visit: Payer: Self-pay

## 2020-08-04 ENCOUNTER — Ambulatory Visit: Payer: Medicare HMO | Admitting: Gastroenterology

## 2020-08-04 VITALS — BP 100/64 | HR 80 | Ht 64.5 in | Wt 178.1 lb

## 2020-08-04 DIAGNOSIS — R1013 Epigastric pain: Secondary | ICD-10-CM | POA: Diagnosis not present

## 2020-08-04 DIAGNOSIS — K219 Gastro-esophageal reflux disease without esophagitis: Secondary | ICD-10-CM

## 2020-08-04 DIAGNOSIS — K259 Gastric ulcer, unspecified as acute or chronic, without hemorrhage or perforation: Secondary | ICD-10-CM | POA: Diagnosis not present

## 2020-08-04 DIAGNOSIS — K3189 Other diseases of stomach and duodenum: Secondary | ICD-10-CM

## 2020-08-04 DIAGNOSIS — K582 Mixed irritable bowel syndrome: Secondary | ICD-10-CM

## 2020-08-04 MED ORDER — PANTOPRAZOLE SODIUM 40 MG PO TBEC: 40.0000 mg | DELAYED_RELEASE_TABLET | Freq: Every day | ORAL | 3 refills | Status: DC

## 2020-08-04 MED ORDER — GLYCOPYRROLATE 1 MG PO TABS: 1.0000 mg | ORAL_TABLET | Freq: Two times a day (BID) | ORAL | 3 refills | Status: DC

## 2020-08-04 NOTE — Patient Instructions (Signed)
We have sent the following medications to your pharmacy for you to pick up at your convenience: protonix and glycopyrrolate.   Thank you for choosing me and [facility].  Pricilla Riffle. Dagoberto Ligas., MD., [facility] Regal

## 2020-08-04 NOTE — Progress Notes (Signed)
    History of Present Illness: This is a 65 year old female with a history of IBS with an alternating bowel pattern.  Recently she had acute nausea, vomiting, diarrhea and occasional incontinence with increased intestinal gas.  She also noted increased LLQ pain. All symptoms have substantially improved after 5 days.  She had moderate left lower quadrant pain that is now improved to mild after 5 days.  She notes a decreased appetite.   EGD 06/2019 - Benign-appearing esophageal stenosis. 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 - Erosive gastropathy with no bleeding and no stigmata of recent bleeding. Biopsied. - Erythematous mucosa in the gastric body and antrum. Biopsied. - Medium-sized hiatal hernia. - Normal duodenal bulb and second portion of the duodenum.  Colonoscopy 04/2017 - Moderate diverticulosis at the hepatic flexure, in the ascending colon and in the cecum. - Severe diverticulosis in the sigmoid colon, in the descending colon, at the splenic flexure and in the transverse colon. There was evidence of diverticular spasm. - Internal hemorrhoids  Current Medications, Allergies, Past Medical History, Past Surgical History, Family History and Social History were reviewed in [employer] record.   Physical Exam: General: Well developed, well nourished, no acute distress Head: Normocephalic and atraumatic Eyes: Sclerae anicteric, EOMI Ears: Normal auditory acuity Mouth: Not examined, mask on during Covid-19 pandemic Lungs: Clear throughout to auscultation Heart: Regular rate and rhythm; no murmurs, rubs or bruits Abdomen: Soft, mild LLQ tenderness and non distended. No masses, hepatosplenomegaly or hernias noted. Normal Bowel sounds Rectal: Not done Musculoskeletal: Symmetrical with no gross deformities  Pulses:  Normal pulses noted Extremities: No clubbing, cyanosis, edema or deformities noted Neurological: Alert oriented x 4, grossly  nonfocal Psychological:  Alert and cooperative. Normal mood and affect   Assessment and Recommendations:  1.  IBS, alternating bowel pattern.  Recent nausea, vomiting, diarrhea with increased LLQ abdominal pain. Suspected acute gastroenteritis or food poisoning which is resolving. Adavnce diet gradually.  Continue glycopyrrolate 1 mg p.o. twice daily.  2. Severe colonic diverticulosis.  Long-term high-fiber diet with adequate daily water intake.  3. GERD, esophageal stricture, erosive gastritis.  Follow antireflux measures and continue pantoprazole 40 mg daily. REV in 1 year.

## 2020-08-08 DIAGNOSIS — M79602 Pain in left arm: Secondary | ICD-10-CM | POA: Diagnosis not present

## 2020-08-08 DIAGNOSIS — M542 Cervicalgia: Secondary | ICD-10-CM | POA: Diagnosis not present

## 2020-08-08 DIAGNOSIS — M25511 Pain in right shoulder: Secondary | ICD-10-CM | POA: Diagnosis not present

## 2020-08-15 DIAGNOSIS — M542 Cervicalgia: Secondary | ICD-10-CM | POA: Diagnosis not present

## 2020-08-16 ENCOUNTER — Other Ambulatory Visit: Payer: Self-pay

## 2020-08-16 MED ORDER — AMLODIPINE BESYLATE 5 MG PO TABS: 5.0000 mg | ORAL_TABLET | Freq: Every day | ORAL | 1 refills | Status: AC

## 2020-08-17 DIAGNOSIS — I10 Essential (primary) hypertension: Secondary | ICD-10-CM | POA: Diagnosis not present

## 2020-08-17 DIAGNOSIS — R7303 Prediabetes: Secondary | ICD-10-CM | POA: Diagnosis not present

## 2020-08-17 DIAGNOSIS — E7800 Pure hypercholesterolemia, unspecified: Secondary | ICD-10-CM | POA: Diagnosis not present

## 2020-08-17 DIAGNOSIS — K582 Mixed irritable bowel syndrome: Secondary | ICD-10-CM | POA: Diagnosis not present

## 2020-08-17 DIAGNOSIS — K219 Gastro-esophageal reflux disease without esophagitis: Secondary | ICD-10-CM | POA: Diagnosis not present

## 2020-08-19 ENCOUNTER — Ambulatory Visit: Payer: Medicare HMO

## 2020-08-22 DIAGNOSIS — E782 Mixed hyperlipidemia: Secondary | ICD-10-CM | POA: Diagnosis not present

## 2020-08-23 LAB — LIPID PANEL
Chol/HDL Ratio: 4 ratio (ref 0.0–4.4)
Cholesterol, Total: 182 mg/dL (ref 100–199)
HDL: 46 mg/dL (ref >39)
LDL Chol Calc (NIH): 124 mg/dL — ABNORMAL HIGH (ref 0–99)
Triglycerides: 63 mg/dL (ref 0–149)
VLDL Cholesterol Cal: 12 mg/dL (ref 5–40)

## 2020-08-24 ENCOUNTER — Ambulatory Visit: Payer: Medicare HMO | Admitting: Cardiology

## 2020-08-24 DIAGNOSIS — M542 Cervicalgia: Secondary | ICD-10-CM | POA: Diagnosis not present

## 2020-08-28 NOTE — Progress Notes (Signed)
Follow up visit  Subjective:   Krystal Gibson, female    DOB: 02-21-1956, 65 y.o.   MRN: [id number]    Chief Complaint  Patient presents with  . Hypertension  . Coronary Artery Disease       . Follow-up    6 month     HPI  65 year old African American female with hypertension, hyperlipidemia, family h/oCAD,  irritable bowel syndrome.  Blood pressure well controlled. Reviewed lipid panel results with the patient, details below. Patient has almost quit smoking, barring occasional cigarette.   Current Outpatient Medications on File Prior to Visit  Medication Sig Dispense Refill  . amLODipine (NORVASC) 5 MG tablet Take 1 tablet (5 mg total) by mouth daily. 90 tablet 1  . cholecalciferol (VITAMIN D) 1000 UNITS tablet Take 1,000 Units by mouth daily.    Marland Kitchen doxycycline (VIBRA-TABS) 100 MG tablet Take 1 tablet (100 mg total) by mouth 2 (two) times daily. 20 tablet 0  . glycopyrrolate (ROBINUL) 1 MG tablet Take 1 tablet (1 mg total) by mouth 2 (two) times daily. 180 tablet 3  . lisinopril-hydrochlorothiazide (ZESTORETIC) 10-12.5 MG tablet Take 1 tablet by mouth daily. 90 tablet 1  . naproxen (NAPROSYN) 500 MG tablet SMARTSIG:1 Tablet(s) By Mouth Every 12 Hours PRN    . pantoprazole (PROTONIX) 40 MG tablet Take 1 tablet (40 mg total) by mouth daily. 90 tablet 3  . pregabalin (LYRICA) 75 MG capsule Take 75 mg by mouth at bedtime.    . rosuvastatin (CRESTOR) 20 MG tablet TAKE 1 TABLET BY MOUTH EVERY DAY 90 tablet 3   No current facility-administered medications on file prior to visit.    Cardiovascular studies:  EKG 08/29/2020: Sinus rhythm 68 bpm  Old inferior infarct  CT cardiac scoring 03/2019: Calcium score: 159. LM: 0. LAD: 67. Cx: 37. RCA: 55  EKG 12/24/2018: Sinus rhythm 62 bpm. Possible old inferior infarct.   Exercise Treadmill Stress Test 05/09/2018:  Indication: CP  The patient exercised on Bruce protocol for 5:21 min. Patient achieved 6.37 METS and  reached HR 115 bpm, which is 73% of maximum age-predicted HR. Stress test terminated due to Leg discomfort and fatigue.  Exercise capacity was below average for age. HR Response to Exercise: Suboptimal due to low exercise capacity.  BP Response to Exercise: Normal resting BP- appropriate response. Chest Pain: none. Arrhythmias: none.  Resting EKG demonstrates Normal sinus rhythm. ST Changes: With peak exercise there was no ST-T changes of ischemia. However, this is submaximal stress test due to poor exercise tolerance.    Overall Impression: Inconclusive, submaximal stress test due to low exercise capacity.  Recommendation: Consider alternate ischemia testing, if clincial sucpision of CAD is high.   Echocardiogram 04/11/2018: Left ventricle cavity is normal in size. Moderate concentric hypertrophy of the left ventricle. Normal global wall motion. Doppler evidence of grade I (impaired) diastolic dysfunction, normal LAP. Calculated EF 63%. No significant valvular abnormality. Inadequate TR jet to estimate PA systolic pressure. Normal right atrial pressure.  Labs: 08/22/2020: Chol 182, TG 63, HDL 46, LDL 124  07/22/2019: Chol 223, TG 143, HDL 43, LDL 154  09/04/2018: Chol 203, TG 104, HDL 38, LDL 144  02/23/2019: Chol 234, TG 94, HDL 47, LDL 170  04/08/2018: Glucose 97. BUN/Cr 14/0.72. eGFR 89. Na/K 143/3.6. AlKP 125 mildly elevated. Rest of the CMP normal.  Chol 262, TG 102, HDL 41, LDL 201 HbAC 5.8%    Review of Systems  Cardiovascular: Negative for chest pain, dyspnea on exertion,  leg swelling, palpitations and syncope.        Vitals:   08/29/20 1142  BP: 123/76  Pulse: 73  Resp: 16  Temp: 98 F (36.7 C)  SpO2: 99%     Objective:   Physical Exam Vitals and nursing note reviewed.  Constitutional:      General: She is not in acute distress. Neck:     Vascular: No JVD.  Cardiovascular:     Rate and Rhythm: Normal rate and regular rhythm.     Heart  sounds: Normal heart sounds. No murmur heard.   Pulmonary:     Effort: Pulmonary effort is normal.     Breath sounds: Normal breath sounds. No wheezing or rales.         Assessment & Recommendations:   65 year old [name] female with hypertension, hyperlipidemia, irritable bowel syndrome.  Hypertension: Well controlled on lisinopril-HCTZ 10-12.5 mg daily and amlodipine 5 mg daily.  Hyperlipidemia: LDL 124, down from 170. Open to increasing Crestor to 40 mg daily. Continue Zetia 10 mg daily. Repeat lipid panel in 6 months  F/u in 6 months  Krystal Gibson Krystal Hardy, MD [facility] Pager: [phone] Office: [phone] If no answer Cell [phone]

## 2020-08-29 ENCOUNTER — Other Ambulatory Visit: Payer: Self-pay

## 2020-08-29 ENCOUNTER — Encounter: Payer: Self-pay | Admitting: Cardiology

## 2020-08-29 ENCOUNTER — Ambulatory Visit: Payer: Medicare HMO | Admitting: Cardiology

## 2020-08-29 VITALS — BP 123/76 | HR 73 | Temp 98.0°F | Resp 16 | Ht 64.0 in | Wt 186.0 lb

## 2020-08-29 DIAGNOSIS — E782 Mixed hyperlipidemia: Secondary | ICD-10-CM

## 2020-08-29 DIAGNOSIS — R931 Abnormal findings on diagnostic imaging of heart and coronary circulation: Secondary | ICD-10-CM | POA: Diagnosis not present

## 2020-08-29 DIAGNOSIS — I10 Essential (primary) hypertension: Secondary | ICD-10-CM

## 2020-08-29 MED ORDER — ROSUVASTATIN CALCIUM 40 MG PO TABS: 40.0000 mg | ORAL_TABLET | Freq: Every day | ORAL | 3 refills | Status: DC

## 2020-09-20 ENCOUNTER — Telehealth: Payer: Self-pay

## 2020-09-20 NOTE — Telephone Encounter (Signed)
Telephone encounter:  Reason for call: patient called and said since upping her rosuvastatin she has been getting severe headaches.   Usual provider: MP  Last office visit: 08/29/20  Next office visit: 03/02/21    Last hospitalization: NA   Current Outpatient Medications on File Prior to Visit  Medication Sig Dispense Refill  . amLODipine (NORVASC) 5 MG tablet Take 1 tablet (5 mg total) by mouth daily. 90 tablet 1  . cholecalciferol (VITAMIN D) 1000 UNITS tablet Take 1,000 Units by mouth daily.    Marland Kitchen ezetimibe (ZETIA) 10 MG tablet 1 tablet    . glycopyrrolate (ROBINUL) 1 MG tablet Take 1 tablet (1 mg total) by mouth 2 (two) times daily. 180 tablet 3  . lisinopril-hydrochlorothiazide (ZESTORETIC) 10-12.5 MG tablet Take 1 tablet by mouth daily. 90 tablet 1  . naproxen (NAPROSYN) 500 MG tablet SMARTSIG:1 Tablet(s) By Mouth Every 12 Hours PRN    . pantoprazole (PROTONIX) 40 MG tablet Take 1 tablet (40 mg total) by mouth daily. 90 tablet 3  . pregabalin (LYRICA) 100 MG capsule Take 100 mg by mouth at bedtime.    . rosuvastatin (CRESTOR) 40 MG tablet Take 1 tablet (40 mg total) by mouth daily. 90 tablet 3   No current facility-administered medications on file prior to visit.

## 2020-09-20 NOTE — Telephone Encounter (Signed)
Okay to reduce to 1/2 tab rosuvastatin and see headaches get better. If they don't, ideally recommend seeing PCP/Neurology for headache evaluation.  Thanks MJP

## 2020-10-04 ENCOUNTER — Other Ambulatory Visit: Payer: Self-pay

## 2020-10-04 ENCOUNTER — Ambulatory Visit
Admission: RE | Admit: 2020-10-04 | Discharge: 2020-10-04 | Disposition: A | Discharge status: Home / Self Care | Payer: Medicare HMO | Source: Ambulatory Visit | Attending: Family Medicine | Admitting: Family Medicine

## 2020-10-04 DIAGNOSIS — Z1231 Encounter for screening mammogram for malignant neoplasm of breast: Secondary | ICD-10-CM | POA: Diagnosis not present

## 2020-10-26 DIAGNOSIS — M25562 Pain in left knee: Secondary | ICD-10-CM | POA: Diagnosis not present

## 2020-10-27 DIAGNOSIS — M25562 Pain in left knee: Secondary | ICD-10-CM | POA: Diagnosis not present

## 2020-11-03 DIAGNOSIS — M25562 Pain in left knee: Secondary | ICD-10-CM | POA: Diagnosis not present

## 2020-11-10 DIAGNOSIS — I10 Essential (primary) hypertension: Secondary | ICD-10-CM | POA: Diagnosis not present

## 2020-11-30 DIAGNOSIS — S83242A Other tear of medial meniscus, current injury, left knee, initial encounter: Secondary | ICD-10-CM | POA: Diagnosis not present

## 2020-11-30 DIAGNOSIS — M948X6 Other specified disorders of cartilage, lower leg: Secondary | ICD-10-CM | POA: Diagnosis not present

## 2020-11-30 DIAGNOSIS — S83232A Complex tear of medial meniscus, current injury, left knee, initial encounter: Secondary | ICD-10-CM | POA: Diagnosis not present

## 2020-11-30 DIAGNOSIS — X58XXXA Exposure to other specified factors, initial encounter: Secondary | ICD-10-CM | POA: Diagnosis not present

## 2020-11-30 DIAGNOSIS — M1712 Unilateral primary osteoarthritis, left knee: Secondary | ICD-10-CM | POA: Diagnosis not present

## 2020-11-30 DIAGNOSIS — G8918 Other acute postprocedural pain: Secondary | ICD-10-CM | POA: Diagnosis not present

## 2020-11-30 DIAGNOSIS — M2342 Loose body in knee, left knee: Secondary | ICD-10-CM | POA: Diagnosis not present

## 2020-11-30 DIAGNOSIS — Y999 Unspecified external cause status: Secondary | ICD-10-CM | POA: Diagnosis not present

## 2020-11-30 HISTORY — PX: KNEE SURGERY: SHX244

## 2020-12-05 DIAGNOSIS — R262 Difficulty in walking, not elsewhere classified: Secondary | ICD-10-CM | POA: Diagnosis not present

## 2020-12-05 DIAGNOSIS — M6281 Muscle weakness (generalized): Secondary | ICD-10-CM | POA: Diagnosis not present

## 2020-12-05 DIAGNOSIS — M1712 Unilateral primary osteoarthritis, left knee: Secondary | ICD-10-CM | POA: Diagnosis not present

## 2020-12-05 DIAGNOSIS — M25662 Stiffness of left knee, not elsewhere classified: Secondary | ICD-10-CM | POA: Diagnosis not present

## 2020-12-08 DIAGNOSIS — M2342 Loose body in knee, left knee: Secondary | ICD-10-CM | POA: Diagnosis not present

## 2020-12-09 DIAGNOSIS — M1712 Unilateral primary osteoarthritis, left knee: Secondary | ICD-10-CM | POA: Diagnosis not present

## 2020-12-09 DIAGNOSIS — R262 Difficulty in walking, not elsewhere classified: Secondary | ICD-10-CM | POA: Diagnosis not present

## 2020-12-09 DIAGNOSIS — M6281 Muscle weakness (generalized): Secondary | ICD-10-CM | POA: Diagnosis not present

## 2020-12-09 DIAGNOSIS — M25662 Stiffness of left knee, not elsewhere classified: Secondary | ICD-10-CM | POA: Diagnosis not present

## 2020-12-14 DIAGNOSIS — M6281 Muscle weakness (generalized): Secondary | ICD-10-CM | POA: Diagnosis not present

## 2020-12-14 DIAGNOSIS — M25662 Stiffness of left knee, not elsewhere classified: Secondary | ICD-10-CM | POA: Diagnosis not present

## 2020-12-14 DIAGNOSIS — M1712 Unilateral primary osteoarthritis, left knee: Secondary | ICD-10-CM | POA: Diagnosis not present

## 2020-12-14 DIAGNOSIS — R262 Difficulty in walking, not elsewhere classified: Secondary | ICD-10-CM | POA: Diagnosis not present

## 2020-12-21 DIAGNOSIS — M1712 Unilateral primary osteoarthritis, left knee: Secondary | ICD-10-CM | POA: Diagnosis not present

## 2020-12-21 DIAGNOSIS — R262 Difficulty in walking, not elsewhere classified: Secondary | ICD-10-CM | POA: Diagnosis not present

## 2020-12-21 DIAGNOSIS — M6281 Muscle weakness (generalized): Secondary | ICD-10-CM | POA: Diagnosis not present

## 2020-12-21 DIAGNOSIS — M25662 Stiffness of left knee, not elsewhere classified: Secondary | ICD-10-CM | POA: Diagnosis not present

## 2020-12-29 DIAGNOSIS — M25662 Stiffness of left knee, not elsewhere classified: Secondary | ICD-10-CM | POA: Diagnosis not present

## 2020-12-29 DIAGNOSIS — M6281 Muscle weakness (generalized): Secondary | ICD-10-CM | POA: Diagnosis not present

## 2020-12-29 DIAGNOSIS — R262 Difficulty in walking, not elsewhere classified: Secondary | ICD-10-CM | POA: Diagnosis not present

## 2020-12-29 DIAGNOSIS — M1712 Unilateral primary osteoarthritis, left knee: Secondary | ICD-10-CM | POA: Diagnosis not present

## 2021-02-16 DIAGNOSIS — I10 Essential (primary) hypertension: Secondary | ICD-10-CM | POA: Diagnosis not present

## 2021-02-16 DIAGNOSIS — K219 Gastro-esophageal reflux disease without esophagitis: Secondary | ICD-10-CM | POA: Diagnosis not present

## 2021-02-16 DIAGNOSIS — R7303 Prediabetes: Secondary | ICD-10-CM | POA: Diagnosis not present

## 2021-02-16 DIAGNOSIS — E7800 Pure hypercholesterolemia, unspecified: Secondary | ICD-10-CM | POA: Diagnosis not present

## 2021-02-16 IMAGING — MG DIGITAL SCREENING BILAT W/ TOMO W/ CAD
6 of 10 series · 6 of 30 positions shown · non-contrast
Comparison: Previous exam(s).

CLINICAL DATA: Screening.

EXAM:
DIGITAL SCREENING BILATERAL MAMMOGRAM WITH TOMO AND CAD

[L MLO synth-2D]
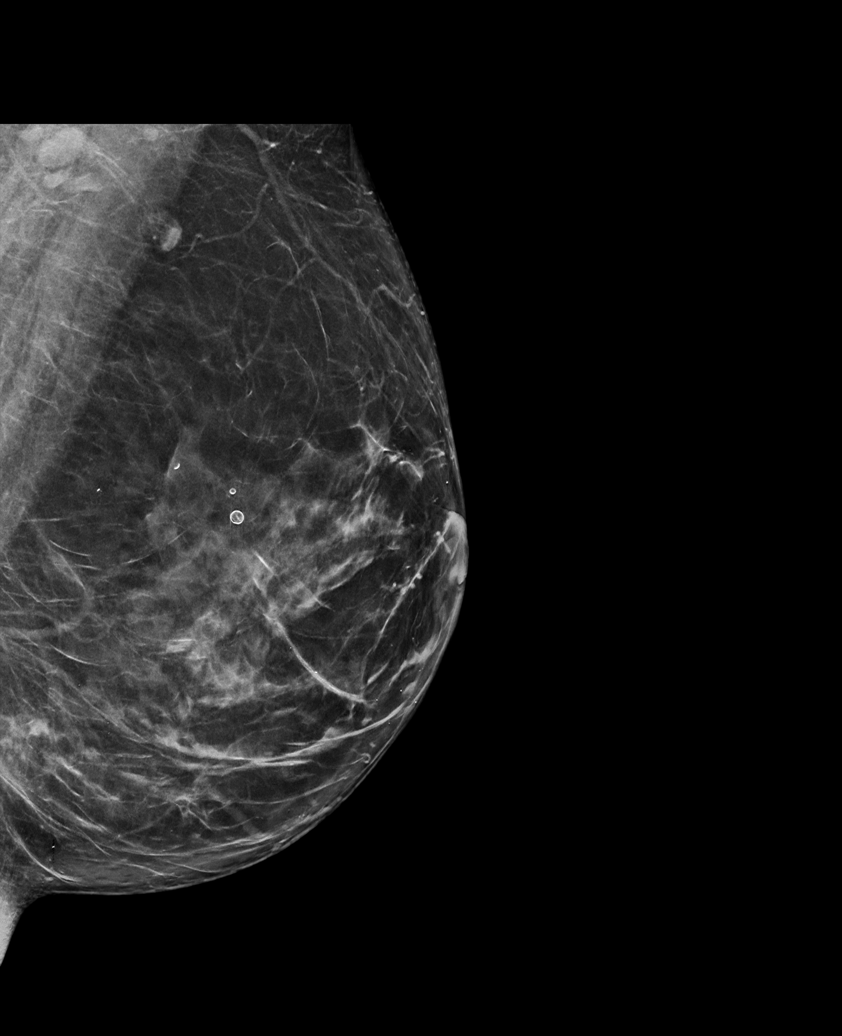

[R CC synth-2D]
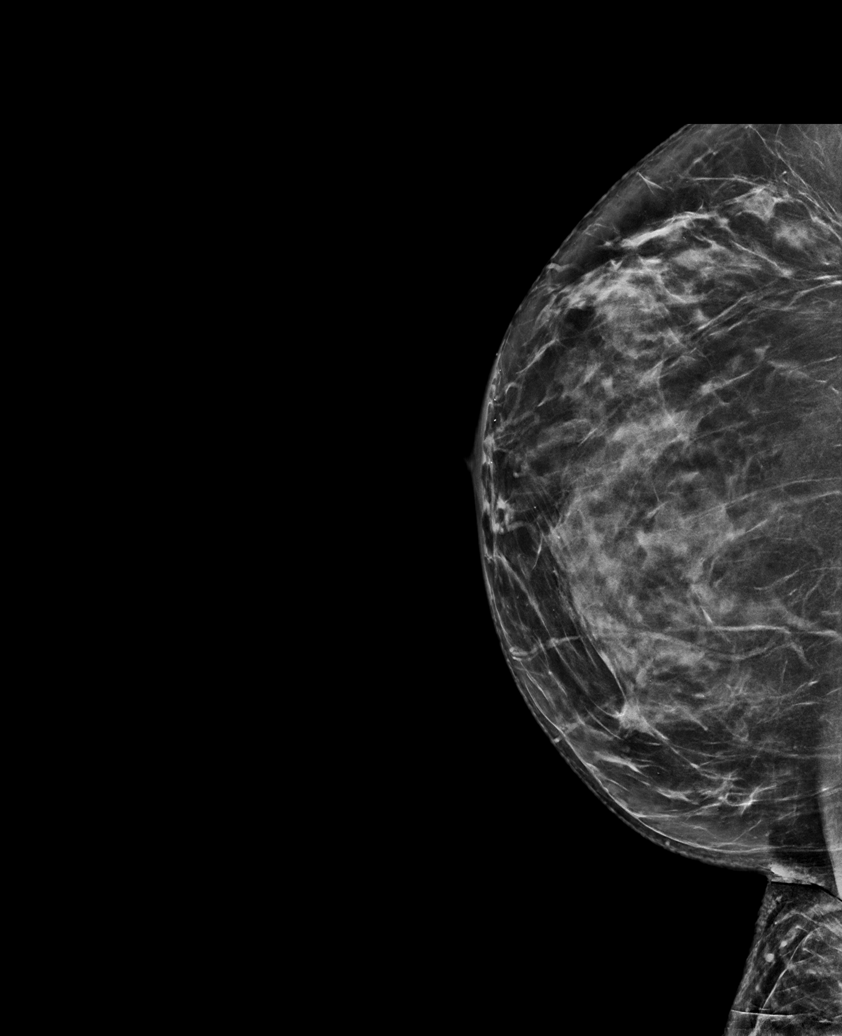

[L CC synth-2D]
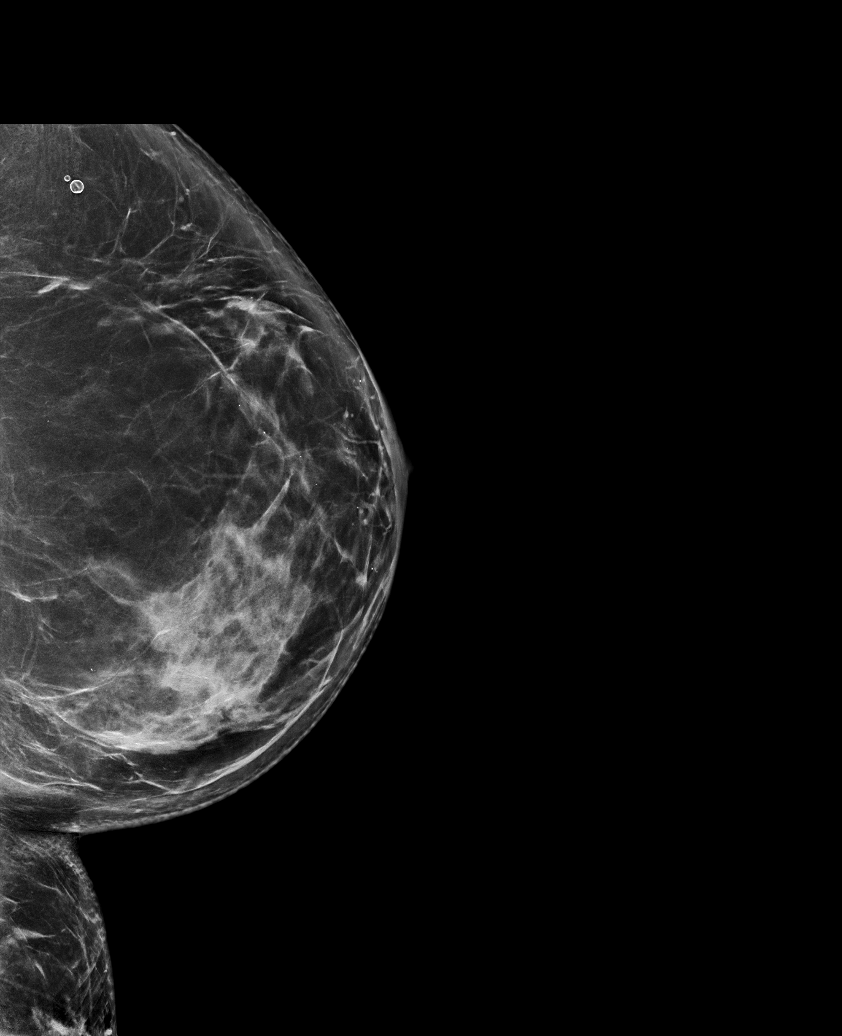

[R MLO synth-2D (1 of 2)]
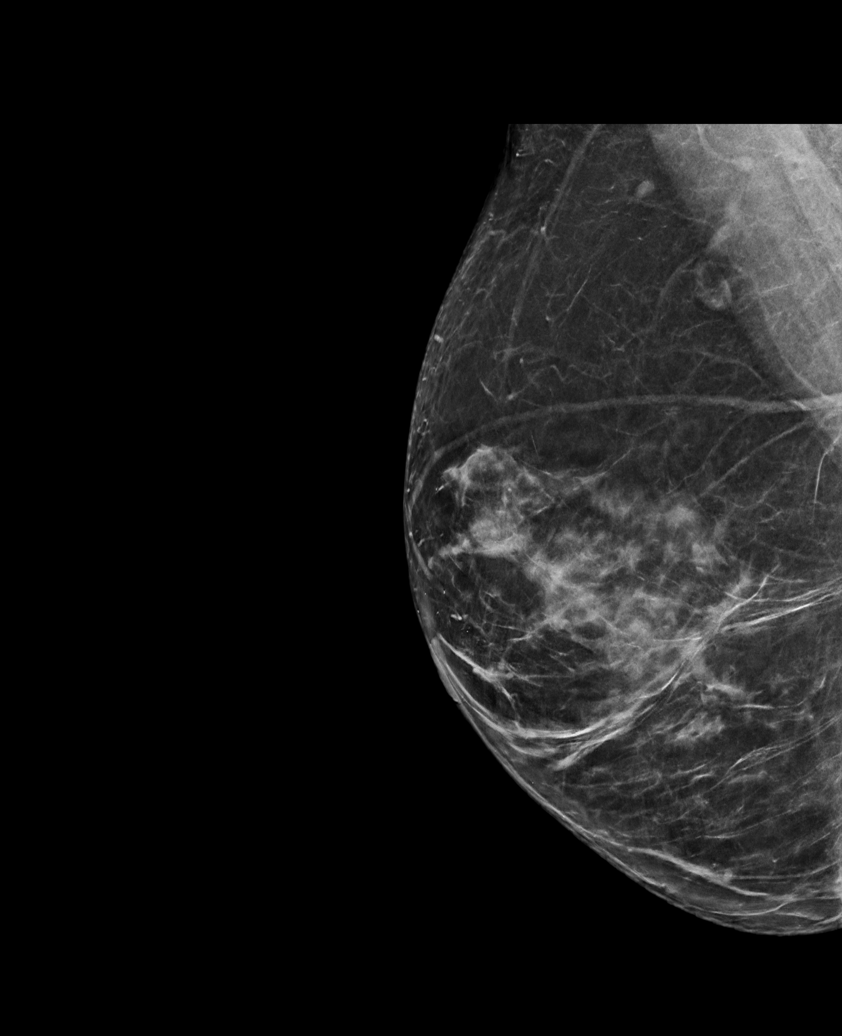

[R MLO synth-2D (2 of 2)]
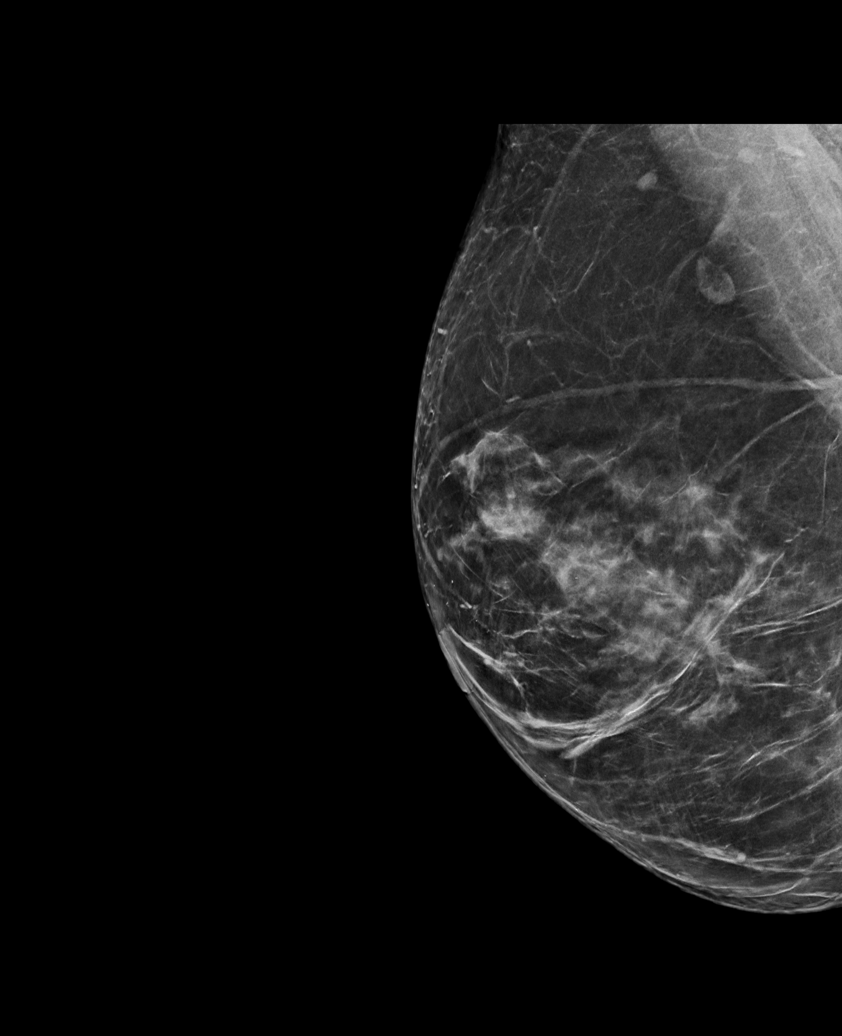

[R MLO tomo · tomo slice 44/87.0]
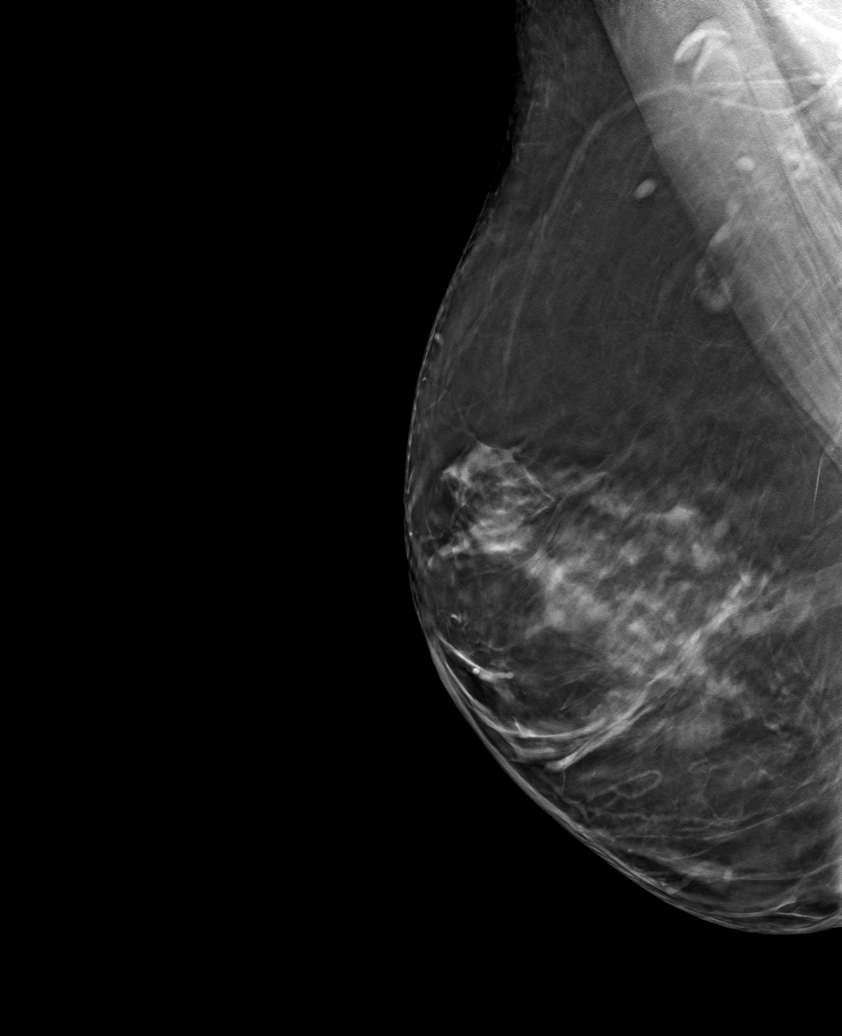

[6 of 30 positions shown; findings below may reference images not displayed]

ACR Breast Density Category c: The breast tissue is heterogeneously
dense, which may obscure small masses.
FINDINGS: There are no findings suspicious for malignancy. Images were
processed with CAD.
IMPRESSION: No mammographic evidence of malignancy. A result letter of this
screening mammogram will be mailed directly to the patient.

RECOMMENDATION:
Screening mammogram in one year. (Code:FT-U-LHB)

BI-RADS CATEGORY  1: Negative.

## 2021-02-20 DIAGNOSIS — M25562 Pain in left knee: Secondary | ICD-10-CM | POA: Diagnosis not present

## 2021-02-20 DIAGNOSIS — M1712 Unilateral primary osteoarthritis, left knee: Secondary | ICD-10-CM | POA: Diagnosis not present

## 2021-03-02 ENCOUNTER — Encounter: Payer: Self-pay | Admitting: Cardiology

## 2021-03-02 ENCOUNTER — Ambulatory Visit: Payer: Medicare HMO | Admitting: Cardiology

## 2021-03-02 ENCOUNTER — Other Ambulatory Visit: Payer: Self-pay

## 2021-03-02 VITALS — BP 134/83 | HR 70 | Ht 64.0 in | Wt 181.0 lb

## 2021-03-02 DIAGNOSIS — R931 Abnormal findings on diagnostic imaging of heart and coronary circulation: Secondary | ICD-10-CM

## 2021-03-02 DIAGNOSIS — E782 Mixed hyperlipidemia: Secondary | ICD-10-CM

## 2021-03-02 DIAGNOSIS — I10 Essential (primary) hypertension: Secondary | ICD-10-CM

## 2021-03-02 NOTE — Progress Notes (Signed)
Follow up visit  Subjective:   Krystal Gibson, female    DOB: Feb 05, 1956, 65 y.o.   MRN: [id number]    Chief Complaint  Patient presents with   Hyperlipidemia   Follow-up     HPI  65 year old African American female with hypertension, hyperlipidemia, family h/oCAD,  irritable bowel syndrome.  Blood pressure well controlled.  Patient will lipid panel testing today, results not available to me yet.  She denies any complaints today.  Current Outpatient Medications on File Prior to Visit  Medication Sig Dispense Refill   amLODipine (NORVASC) 5 MG tablet Take 1 tablet (5 mg total) by mouth daily. 90 tablet 1   cholecalciferol (VITAMIN D) 1000 UNITS tablet Take 1,000 Units by mouth daily.     ezetimibe (ZETIA) 10 MG tablet 1 tablet     glycopyrrolate (ROBINUL) 1 MG tablet Take 1 tablet (1 mg total) by mouth 2 (two) times daily. 180 tablet 3   lisinopril-hydrochlorothiazide (ZESTORETIC) 10-12.5 MG tablet Take 1 tablet by mouth daily. 90 tablet 1   naproxen (NAPROSYN) 500 MG tablet SMARTSIG:1 Tablet(s) By Mouth Every 12 Hours PRN     pantoprazole (PROTONIX) 40 MG tablet Take 1 tablet (40 mg total) by mouth daily. 90 tablet 3   pregabalin (LYRICA) 100 MG capsule Take 100 mg by mouth at bedtime.     rosuvastatin (CRESTOR) 40 MG tablet Take 1 tablet (40 mg total) by mouth daily. 90 tablet 3   No current facility-administered medications on file prior to visit.    Cardiovascular studies:  EKG 03/02/2021: Sinus rhythm 65 bpm Possible old anteroseptal infarct Poor R wave progression   CT cardiac scoring 03/2019: Calcium score: 159. LM: 0. LAD: 65. Cx: 37. RCA: 55  EKG 12/24/2018: Sinus rhythm 62 bpm. Possible old inferior infarct.   Exercise Treadmill Stress Test  05/09/2018:  Indication: CP   The patient exercised on Bruce protocol for 5:21 min. Patient achieved  6.37 METS and reached HR 115  bpm, which is   73% of maximum age-predicted HR.  Stress test terminated  due to  Leg discomfort and fatigue.   Exercise capacity was below average for age. HR Response to Exercise: Suboptimal due to low exercise capacity.  BP Response to Exercise: Normal resting BP- appropriate response. Chest Pain: none. Arrhythmias: none.   Resting EKG demonstrates Normal sinus rhythm. ST Changes: With peak exercise there was no ST-T changes of ischemia. However, this is submaximal stress test due to poor exercise tolerance.     Overall Impression: Inconclusive, submaximal stress test due to  low exercise capacity.  Recommendation: Consider alternate ischemia testing, if clincial sucpision of CAD is high.    Echocardiogram 04/11/2018: Left ventricle cavity is normal in size. Moderate concentric hypertrophy of the left ventricle. Normal global wall motion. Doppler evidence of grade I (impaired) diastolic dysfunction, normal LAP. Calculated EF 63%. No significant valvular abnormality. Inadequate TR jet to estimate PA systolic pressure. Normal right atrial pressure.   Labs: 08/22/2020: Chol 182, TG 63, HDL 46, LDL 124  07/22/2019: Chol 223, TG 143, HDL 43, LDL 154  09/04/2018: Chol 203, TG 104, HDL 38, LDL 144  02/23/2019: Chol 234, TG 94, HDL 47, LDL 170  04/08/2018: Glucose 97. BUN/Cr 14/0.72. eGFR 89. Na/K 143/3.6. AlKP 125 mildly elevated. Rest of the CMP normal.  Chol 262, TG 102, HDL 41, LDL 201 HbAC 5.8%     Review of Systems  Cardiovascular:  Negative for chest pain, dyspnea on exertion, leg swelling,  palpitations and syncope.       Vitals:   03/02/21 1415  BP: 134/83  Pulse: 70  SpO2: 96%     Objective:   Physical Exam Vitals and nursing note reviewed.  Constitutional:      General: She is not in acute distress. Neck:     Vascular: No JVD.  Cardiovascular:     Rate and Rhythm: Normal rate and regular rhythm.     Heart sounds: Normal heart sounds. No murmur heard. Pulmonary:     Effort: Pulmonary effort is normal.     Breath sounds: Normal  breath sounds. No wheezing or rales.  Musculoskeletal:     Right lower leg: No edema.     Left lower leg: No edema.        Assessment & Recommendations:   65 year old [name] female with hypertension, hyperlipidemia, elevated coronary calcium score   Hypertension: Well controlled on lisinopril-HCTZ 10-12.5 mg daily and amlodipine 5 mg daily.  Hyperlipidemia, elevated coronary calcium score: Lipid panel pending. Currently takes 1.2 tab of Crestor to 40 mg daily, Zetia 10 mg daily.  F/u in 6 months  Cyra Spader Esther Hardy, MD [facility] Pager: [phone] Office: [phone] If no answer Cell [phone]

## 2021-03-03 ENCOUNTER — Encounter: Payer: Self-pay | Admitting: Cardiology

## 2021-03-03 LAB — LIPID PANEL
Chol/HDL Ratio: 3.8 ratio (ref 0.0–4.4)
Cholesterol, Total: 177 mg/dL (ref 100–199)
HDL: 47 mg/dL (ref >39)
LDL Chol Calc (NIH): 114 mg/dL — ABNORMAL HIGH (ref 0–99)
Triglycerides: 87 mg/dL (ref 0–149)
VLDL Cholesterol Cal: 16 mg/dL (ref 5–40)

## 2021-03-04 ENCOUNTER — Other Ambulatory Visit: Payer: Self-pay | Admitting: Cardiology

## 2021-03-04 DIAGNOSIS — E782 Mixed hyperlipidemia: Secondary | ICD-10-CM

## 2021-03-06 DIAGNOSIS — M1712 Unilateral primary osteoarthritis, left knee: Secondary | ICD-10-CM | POA: Diagnosis not present

## 2021-03-09 ENCOUNTER — Encounter: Payer: Self-pay | Admitting: Cardiology

## 2021-03-09 NOTE — Telephone Encounter (Signed)
From patient.

## 2021-03-09 NOTE — Telephone Encounter (Signed)
See if [facility] can reduce cost. If not, then lets stick to Crestor for now, without Ezetimibe.  Thanks MJP

## 2021-03-10 NOTE — Telephone Encounter (Signed)
From pt

## 2021-03-10 NOTE — Telephone Encounter (Signed)
Noted  

## 2021-03-13 DIAGNOSIS — M1712 Unilateral primary osteoarthritis, left knee: Secondary | ICD-10-CM | POA: Diagnosis not present

## 2021-03-21 DIAGNOSIS — M1712 Unilateral primary osteoarthritis, left knee: Secondary | ICD-10-CM | POA: Diagnosis not present

## 2021-04-01 DIAGNOSIS — I2510 Atherosclerotic heart disease of native coronary artery without angina pectoris: Secondary | ICD-10-CM | POA: Diagnosis not present

## 2021-04-01 DIAGNOSIS — K2970 Gastritis, unspecified, without bleeding: Secondary | ICD-10-CM | POA: Diagnosis not present

## 2021-04-01 DIAGNOSIS — I10 Essential (primary) hypertension: Secondary | ICD-10-CM | POA: Diagnosis not present

## 2021-04-01 DIAGNOSIS — G8929 Other chronic pain: Secondary | ICD-10-CM | POA: Diagnosis not present

## 2021-04-01 DIAGNOSIS — K219 Gastro-esophageal reflux disease without esophagitis: Secondary | ICD-10-CM | POA: Diagnosis not present

## 2021-04-01 DIAGNOSIS — E785 Hyperlipidemia, unspecified: Secondary | ICD-10-CM | POA: Diagnosis not present

## 2021-04-01 DIAGNOSIS — K589 Irritable bowel syndrome without diarrhea: Secondary | ICD-10-CM | POA: Diagnosis not present

## 2021-04-01 DIAGNOSIS — H547 Unspecified visual loss: Secondary | ICD-10-CM | POA: Diagnosis not present

## 2021-04-01 DIAGNOSIS — Z833 Family history of diabetes mellitus: Secondary | ICD-10-CM | POA: Diagnosis not present

## 2021-04-01 DIAGNOSIS — M1990 Unspecified osteoarthritis, unspecified site: Secondary | ICD-10-CM | POA: Diagnosis not present

## 2021-04-01 DIAGNOSIS — G629 Polyneuropathy, unspecified: Secondary | ICD-10-CM | POA: Diagnosis not present

## 2021-04-01 DIAGNOSIS — Z8249 Family history of ischemic heart disease and other diseases of the circulatory system: Secondary | ICD-10-CM | POA: Diagnosis not present

## 2021-04-01 DIAGNOSIS — Z882 Allergy status to sulfonamides status: Secondary | ICD-10-CM | POA: Diagnosis not present

## 2021-04-01 DIAGNOSIS — Z87891 Personal history of nicotine dependence: Secondary | ICD-10-CM | POA: Diagnosis not present

## 2021-04-04 DIAGNOSIS — M1711 Unilateral primary osteoarthritis, right knee: Secondary | ICD-10-CM | POA: Diagnosis not present

## 2021-04-11 DIAGNOSIS — M1711 Unilateral primary osteoarthritis, right knee: Secondary | ICD-10-CM | POA: Diagnosis not present

## 2021-04-18 DIAGNOSIS — M25562 Pain in left knee: Secondary | ICD-10-CM | POA: Diagnosis not present

## 2021-04-18 DIAGNOSIS — M1711 Unilateral primary osteoarthritis, right knee: Secondary | ICD-10-CM | POA: Diagnosis not present

## 2021-05-24 DIAGNOSIS — Z03818 Encounter for observation for suspected exposure to other biological agents ruled out: Secondary | ICD-10-CM | POA: Diagnosis not present

## 2021-06-08 DIAGNOSIS — M1712 Unilateral primary osteoarthritis, left knee: Secondary | ICD-10-CM | POA: Diagnosis not present

## 2021-06-08 DIAGNOSIS — M25562 Pain in left knee: Secondary | ICD-10-CM | POA: Diagnosis not present

## 2021-06-19 DIAGNOSIS — H5200 Hypermetropia, unspecified eye: Secondary | ICD-10-CM | POA: Diagnosis not present

## 2021-08-07 DIAGNOSIS — M542 Cervicalgia: Secondary | ICD-10-CM | POA: Diagnosis not present

## 2021-08-07 DIAGNOSIS — H52223 Regular astigmatism, bilateral: Secondary | ICD-10-CM | POA: Diagnosis not present

## 2021-08-07 DIAGNOSIS — M5412 Radiculopathy, cervical region: Secondary | ICD-10-CM | POA: Diagnosis not present

## 2021-08-07 DIAGNOSIS — Z961 Presence of intraocular lens: Secondary | ICD-10-CM | POA: Diagnosis not present

## 2021-08-07 DIAGNOSIS — H524 Presbyopia: Secondary | ICD-10-CM | POA: Diagnosis not present

## 2021-08-14 ENCOUNTER — Encounter: Payer: Self-pay | Admitting: Cardiology

## 2021-08-14 ENCOUNTER — Telehealth: Payer: Self-pay | Admitting: Cardiology

## 2021-08-14 DIAGNOSIS — M50322 Other cervical disc degeneration at C5-C6 level: Secondary | ICD-10-CM | POA: Diagnosis not present

## 2021-08-14 DIAGNOSIS — M9901 Segmental and somatic dysfunction of cervical region: Secondary | ICD-10-CM | POA: Diagnosis not present

## 2021-08-14 NOTE — Telephone Encounter (Signed)
Patient wants to know if you want her to have blood work done prior to her next appointment. If so, she wants [place] to fax the orders to her pcp so they can draw her blood there. ?

## 2021-08-14 NOTE — Telephone Encounter (Signed)
From patient.

## 2021-08-15 NOTE — Telephone Encounter (Signed)
Okay with me, ?Krystal Gibson, she will need CMP and lipid panel. ? ?Thanks ?MJP ? ?

## 2021-08-16 ENCOUNTER — Other Ambulatory Visit: Payer: Self-pay

## 2021-08-16 DIAGNOSIS — E782 Mixed hyperlipidemia: Secondary | ICD-10-CM

## 2021-08-16 NOTE — Telephone Encounter (Signed)
Called and spoke to pt, pt voiced understanding. Lab orders have been sent to pts PCP.

## 2021-08-18 DIAGNOSIS — M159 Polyosteoarthritis, unspecified: Secondary | ICD-10-CM | POA: Diagnosis not present

## 2021-08-18 DIAGNOSIS — E7800 Pure hypercholesterolemia, unspecified: Secondary | ICD-10-CM | POA: Diagnosis not present

## 2021-08-18 DIAGNOSIS — I10 Essential (primary) hypertension: Secondary | ICD-10-CM | POA: Diagnosis not present

## 2021-08-18 DIAGNOSIS — R7303 Prediabetes: Secondary | ICD-10-CM | POA: Diagnosis not present

## 2021-08-21 DIAGNOSIS — M9901 Segmental and somatic dysfunction of cervical region: Secondary | ICD-10-CM | POA: Diagnosis not present

## 2021-08-21 DIAGNOSIS — M50322 Other cervical disc degeneration at C5-C6 level: Secondary | ICD-10-CM | POA: Diagnosis not present

## 2021-08-28 DIAGNOSIS — M50322 Other cervical disc degeneration at C5-C6 level: Secondary | ICD-10-CM | POA: Diagnosis not present

## 2021-08-28 DIAGNOSIS — M9901 Segmental and somatic dysfunction of cervical region: Secondary | ICD-10-CM | POA: Diagnosis not present

## 2021-08-31 ENCOUNTER — Ambulatory Visit: Payer: Medicare HMO | Admitting: Cardiology

## 2021-09-04 DIAGNOSIS — M9901 Segmental and somatic dysfunction of cervical region: Secondary | ICD-10-CM | POA: Diagnosis not present

## 2021-09-04 DIAGNOSIS — M50322 Other cervical disc degeneration at C5-C6 level: Secondary | ICD-10-CM | POA: Diagnosis not present

## 2021-09-11 DIAGNOSIS — M50322 Other cervical disc degeneration at C5-C6 level: Secondary | ICD-10-CM | POA: Diagnosis not present

## 2021-09-11 DIAGNOSIS — M9901 Segmental and somatic dysfunction of cervical region: Secondary | ICD-10-CM | POA: Diagnosis not present

## 2021-09-19 DIAGNOSIS — M50322 Other cervical disc degeneration at C5-C6 level: Secondary | ICD-10-CM | POA: Diagnosis not present

## 2021-09-19 DIAGNOSIS — M9901 Segmental and somatic dysfunction of cervical region: Secondary | ICD-10-CM | POA: Diagnosis not present

## 2021-09-25 ENCOUNTER — Ambulatory Visit: Payer: Medicare HMO | Admitting: Cardiology

## 2021-09-25 VITALS — BP 141/81 | HR 56 | Temp 98.2°F | Resp 16 | Ht 65.0 in | Wt 178.0 lb

## 2021-09-25 DIAGNOSIS — M1712 Unilateral primary osteoarthritis, left knee: Secondary | ICD-10-CM | POA: Diagnosis not present

## 2021-09-25 DIAGNOSIS — I10 Essential (primary) hypertension: Secondary | ICD-10-CM

## 2021-09-25 DIAGNOSIS — E782 Mixed hyperlipidemia: Secondary | ICD-10-CM | POA: Diagnosis not present

## 2021-09-25 DIAGNOSIS — R931 Abnormal findings on diagnostic imaging of heart and coronary circulation: Secondary | ICD-10-CM | POA: Diagnosis not present

## 2021-09-25 NOTE — Progress Notes (Unsigned)
Follow up visit  Subjective:   Krystal Gibson, female    DOB: 1955/07/10, 66 y.o.   MRN: [id number]    No chief complaint on file.    HPI  66 year old African American female with hypertension, hyperlipidemia, family h/oCAD,  irritable bowel syndrome.  Patient is no longer taking Crestor due to side effects of headache and hair loss.Blood pressure reaosnably well controlled. Reviewed recent test results with the patient, details below.      Current Outpatient Medications:    amLODipine (NORVASC) 5 MG tablet, Take 1 tablet (5 mg total) by mouth daily., Disp: 90 tablet, Rfl: 1   cholecalciferol (VITAMIN D) 1000 UNITS tablet, Take 1,000 Units by mouth daily., Disp: , Rfl:    ezetimibe (ZETIA) 10 MG tablet, TAKE 1 TABLET BY MOUTH EVERY DAY, Disp: 90 tablet, Rfl: 1   glycopyrrolate (ROBINUL) 1 MG tablet, Take 1 tablet (1 mg total) by mouth 2 (two) times daily., Disp: 180 tablet, Rfl: 3   lisinopril-hydrochlorothiazide (ZESTORETIC) 10-12.5 MG tablet, Take 1 tablet by mouth daily., Disp: 90 tablet, Rfl: 1   pantoprazole (PROTONIX) 40 MG tablet, Take 1 tablet (40 mg total) by mouth daily., Disp: 90 tablet, Rfl: 3   pregabalin (LYRICA) 100 MG capsule, Take 100 mg by mouth at bedtime., Disp: , Rfl:    rosuvastatin (CRESTOR) 40 MG tablet, Take 1 tablet (40 mg total) by mouth daily., Disp: 90 tablet, Rfl: 3    Cardiovascular studies:  EKG 03/02/2021: Sinus rhythm 65 bpm Possible old anteroseptal infarct Poor R wave progression   CT cardiac scoring 03/2019: Calcium score: 159. LM: 0. LAD: 65. Cx: 37. RCA: 55  EKG 12/24/2018: Sinus rhythm 62 bpm. Possible old inferior infarct.   Exercise Treadmill Stress Test  05/09/2018:  Indication: CP   The patient exercised on Bruce protocol for 5:21 min. Patient achieved  6.37 METS and reached HR 115  bpm, which is   73% of maximum age-predicted HR.  Stress test terminated due to  Leg discomfort and fatigue.   Exercise capacity  was below average for age. HR Response to Exercise: Suboptimal due to low exercise capacity.  BP Response to Exercise: Normal resting BP- appropriate response. Chest Pain: none. Arrhythmias: none.   Resting EKG demonstrates Normal sinus rhythm. ST Changes: With peak exercise there was no ST-T changes of ischemia. However, this is submaximal stress test due to poor exercise tolerance.     Overall Impression: Inconclusive, submaximal stress test due to  low exercise capacity.  Recommendation: Consider alternate ischemia testing, if clincial sucpision of CAD is high.    Echocardiogram 04/11/2018: Left ventricle cavity is normal in size. Moderate concentric hypertrophy of the left ventricle. Normal global wall motion. Doppler evidence of grade I (impaired) diastolic dysfunction, normal LAP. Calculated EF 63%. No significant valvular abnormality. Inadequate TR jet to estimate PA systolic pressure. Normal right atrial pressure.   Labs: 08/18/2021: Glucose 91, BUN/Cr 14/0.6. EGFR 98. Na/K 146/4.6. Rest of the CMP normal H/H 14/42. MCV 86. Platelets 282 HbA1C 6.0% Chol 194, TG 105, HDL 42, LDL 133  03/02/2021: Chol 177, TG 87, HDL 47, LDL 114  08/22/2020: Chol 182, TG 63, HDL 46, LDL 124  07/22/2019: Chol 223, TG 143, HDL 43, LDL 154  04/08/2018: Glucose 97. BUN/Cr 14/0.72. eGFR 89. Na/K 143/3.6. AlKP 125 mildly elevated. Rest of the CMP normal.  Chol 262, TG 102, HDL 41, LDL 201 HbAC 5.8%     Review of Systems  Cardiovascular:  Negative for  chest pain, dyspnea on exertion, leg swelling, palpitations and syncope.       Vitals:   09/26/21 0928  BP: (!) 141/81  Pulse: (!) 56  Resp: 16  Temp: 98.2 F (36.8 C)  SpO2: 98%     Objective:   Physical Exam Vitals and nursing note reviewed.  Constitutional:      General: She is not in acute distress. Neck:     Vascular: No JVD.  Cardiovascular:     Rate and Rhythm: Normal rate and regular rhythm.     Heart sounds: Normal  heart sounds. No murmur heard. Pulmonary:     Effort: Pulmonary effort is normal.     Breath sounds: Normal breath sounds. No wheezing or rales.  Musculoskeletal:     Right lower leg: No edema.     Left lower leg: No edema.        Assessment & Recommendations:   66 year old [name] female with hypertension, hyperlipidemia, elevated coronary calcium score   Hypertension: Fairly well controlled on lisinopril-HCTZ 10-12.5 mg daily and amlodipine 5 mg daily.  Mixed hyperlipidemia, elevated coronary calcium score: LDL 133 on Crestor 40 and Zetia 10, she has stopped Crestor since. Continue Zetia, added Lipitor 20 mg, to be up titrated as tolerated. She does not want to be on injectable medications at this time.  Repeat lipid panel and f/u in 3 months  [place], MD [facility] Pager: [phone] Office: [phone] If no answer Cell [phone]

## 2021-09-26 ENCOUNTER — Encounter: Payer: Self-pay | Admitting: Cardiology

## 2021-09-26 MED ORDER — ATORVASTATIN CALCIUM 20 MG PO TABS: 20.0000 mg | ORAL_TABLET | Freq: Every day | ORAL | 3 refills | Status: DC

## 2021-09-26 MED ORDER — EZETIMIBE 10 MG PO TABS: 10.0000 mg | ORAL_TABLET | Freq: Every day | ORAL | 3 refills | Status: DC

## 2021-10-09 DIAGNOSIS — M1712 Unilateral primary osteoarthritis, left knee: Secondary | ICD-10-CM | POA: Diagnosis not present

## 2021-10-10 ENCOUNTER — Encounter: Payer: Self-pay | Admitting: Cardiology

## 2021-11-01 ENCOUNTER — Other Ambulatory Visit: Payer: Self-pay | Admitting: Family Medicine

## 2021-11-01 DIAGNOSIS — Z1231 Encounter for screening mammogram for malignant neoplasm of breast: Secondary | ICD-10-CM

## 2021-11-13 ENCOUNTER — Ambulatory Visit
Admission: RE | Admit: 2021-11-13 | Discharge: 2021-11-13 | Disposition: A | Discharge status: Home / Self Care | Payer: Medicare HMO | Source: Ambulatory Visit | Attending: Family Medicine | Admitting: Family Medicine

## 2021-11-13 DIAGNOSIS — Z008 Encounter for other general examination: Secondary | ICD-10-CM | POA: Diagnosis not present

## 2021-11-13 DIAGNOSIS — K589 Irritable bowel syndrome without diarrhea: Secondary | ICD-10-CM | POA: Diagnosis not present

## 2021-11-13 DIAGNOSIS — K219 Gastro-esophageal reflux disease without esophagitis: Secondary | ICD-10-CM | POA: Diagnosis not present

## 2021-11-13 DIAGNOSIS — Z882 Allergy status to sulfonamides status: Secondary | ICD-10-CM | POA: Diagnosis not present

## 2021-11-13 DIAGNOSIS — G629 Polyneuropathy, unspecified: Secondary | ICD-10-CM | POA: Diagnosis not present

## 2021-11-13 DIAGNOSIS — Z1231 Encounter for screening mammogram for malignant neoplasm of breast: Secondary | ICD-10-CM

## 2021-11-13 DIAGNOSIS — G589 Mononeuropathy, unspecified: Secondary | ICD-10-CM | POA: Diagnosis not present

## 2021-11-13 DIAGNOSIS — I10 Essential (primary) hypertension: Secondary | ICD-10-CM | POA: Diagnosis not present

## 2021-11-13 DIAGNOSIS — M1990 Unspecified osteoarthritis, unspecified site: Secondary | ICD-10-CM | POA: Diagnosis not present

## 2021-11-13 DIAGNOSIS — E785 Hyperlipidemia, unspecified: Secondary | ICD-10-CM | POA: Diagnosis not present

## 2021-11-13 DIAGNOSIS — K5792 Diverticulitis of intestine, part unspecified, without perforation or abscess without bleeding: Secondary | ICD-10-CM | POA: Diagnosis not present

## 2021-11-13 DIAGNOSIS — R32 Unspecified urinary incontinence: Secondary | ICD-10-CM | POA: Diagnosis not present

## 2021-11-13 DIAGNOSIS — I2510 Atherosclerotic heart disease of native coronary artery without angina pectoris: Secondary | ICD-10-CM | POA: Diagnosis not present

## 2021-11-13 DIAGNOSIS — R69 Illness, unspecified: Secondary | ICD-10-CM | POA: Diagnosis not present

## 2021-11-15 NOTE — Care Plan (Signed)
Ortho Bundle Case Management Note  Patient Details  Name: Krystal Gibson MRN: [id number] Date of Birth: 02/03/1956   Met with patient in the office prior to surgery. SHe will discharge to home with family to assist. Rolling walker and CPM ordered for home use. OPPT set up with [facility]. Patient and MD in agreement with plan. Choice offered                   DME Arranged:  Walker rolling, CPM DME Agency:  [employer]  HH Arranged:    [place] Agency:     Additional Comments: Please contact me with any questions of if this plan should need to change.  Ladell Heads,  [place] Specialist  [phone] 11/15/2021, 3:24 PM

## 2021-11-16 DIAGNOSIS — M1712 Unilateral primary osteoarthritis, left knee: Secondary | ICD-10-CM | POA: Diagnosis not present

## 2021-11-19 ENCOUNTER — Ambulatory Visit: Payer: Self-pay | Admitting: Physician Assistant

## 2021-11-19 DIAGNOSIS — G8929 Other chronic pain: Secondary | ICD-10-CM

## 2021-11-19 NOTE — H&P (Signed)
TOTAL KNEE ADMISSION H&P  Patient is being admitted for left total knee arthroplasty.  Subjective:  Chief Complaint:left knee pain.  HPI: Krystal Gibson, 66 y.o. female, has a history of pain and functional disability in the left knee due to arthritis and has failed non-surgical conservative treatments for greater than 12 weeks to includeNSAID's and/or analgesics, corticosteriod injections, viscosupplementation injections, use of assistive devices, weight reduction as appropriate, and activity modification.  Onset of symptoms was gradual, starting 7 years ago with gradually worsening course since that time. The patient noted prior procedures on the knee to include  arthroscopy and menisectomy on the left knee(s).  Patient currently rates pain in the left knee(s) at 9 out of 10 with activity. Patient has night pain, worsening of pain with activity and weight bearing, pain that interferes with activities of daily living, pain with passive range of motion, crepitus, and joint swelling.  Patient has evidence of periarticular osteophytes and joint space narrowing by imaging studies. There is no active infection.  Patient Active Problem List   Diagnosis Date Noted   Elevated coronary artery calcium score 04/22/2019   Coronary artery disease involving native coronary artery of native heart without angina pectoris 04/22/2019   Family history of early CAD 03/11/2019   Essential hypertension 09/11/2018   Diabetes type 2, uncontrolled 06/10/2018   Type 2 diabetes mellitus ([place]) 06/10/2018   Sleep apnea 06/10/2018   Bilateral leg edema 06/10/2018   Dyspnea on exertion 06/10/2018   Statin intolerance 02/25/2017   Mixed hyperlipidemia 02/25/2017   Tobacco use disorder 02/25/2017   Generalized abdominal pain 06/10/2014   Seasonal allergies 01/21/2014   Depression 01/08/2012   Anxiety 06/05/2011   DIVERTICULITIS OF COLON 06/09/2010   IBS 06/09/2010   OBESITY 12/05/2007   HEADACHE, TENSION  12/05/2007   Resistant hypertension 12/05/2007   Diverticulosis of large intestine 12/05/2007   DEGENERATIVE JOINT DISEASE, RIGHT SHOULDER 12/05/2007   Osteoarthrosis involving lower leg 12/05/2007   Past Medical History:  Diagnosis Date   Allergy    Anxiety    Arthritis    AVM (arteriovenous malformation)    of esophagus   Colitis    Colon polyp    Depression    Diverticulitis    Diverticulosis    DJD (degenerative joint disease)    Hyperlipidemia    Hypertension    IBS (irritable bowel syndrome)    Tension headache     Past Surgical History:  Procedure Laterality Date   ABDOMINAL HYSTERECTOMY     BREAST EXCISIONAL BIOPSY     BREAST SURGERY     BUNIONECTOMY Left 2000   and toe nail removal   CESAREAN SECTION     COLONOSCOPY     KNEE SURGERY Left 11/30/2020   REDUCTION MAMMAPLASTY Bilateral    1980   SHOULDER SURGERY Right    TOENAIL EXCISION Right 05/11/2020   TUBAL LIGATION     UPPER GASTROINTESTINAL ENDOSCOPY      Current Outpatient Medications  Medication Sig Dispense Refill Last Dose   amLODipine (NORVASC) 5 MG tablet Take 1 tablet (5 mg total) by mouth daily. 90 tablet 1    atorvastatin (LIPITOR) 20 MG tablet Take 1 tablet (20 mg total) by mouth daily. 90 tablet 3    cholecalciferol (VITAMIN D) 1000 UNITS tablet Take 1,000 Units by mouth daily.      ezetimibe (ZETIA) 10 MG tablet Take 1 tablet (10 mg total) by mouth daily. 90 tablet 3    glycopyrrolate (ROBINUL) 1 MG  tablet Take 1 tablet (1 mg total) by mouth 2 (two) times daily. 180 tablet 3    lisinopril-hydrochlorothiazide (ZESTORETIC) 10-12.5 MG tablet Take 1 tablet by mouth daily. 90 tablet 1    pantoprazole (PROTONIX) 40 MG tablet Take 1 tablet (40 mg total) by mouth daily. 90 tablet 3    pregabalin (LYRICA) 100 MG capsule Take 100 mg by mouth at bedtime.      No current facility-administered medications for this visit.   Allergies  Allergen Reactions   Sulfa Antibiotics Swelling    Eyes swollen  and redness    Social History   Tobacco Use   Smoking status: Some Days    Packs/day: 0.25    Years: 10.00    Total pack years: 2.50    Types: Cigarettes   Smokeless tobacco: Never  Substance Use Topics   Alcohol use: No    Comment: stopped 1990    Family History  Problem Relation Age of Onset   Diabetes Mother    Hyperlipidemia Mother    Cervical cancer Mother    CAD Sister        Coronary stent in 18s   Diabetes Brother    Hyperlipidemia Brother    Kidney disease Brother    Diverticulosis Sister    Breast cancer Other    Multiple sclerosis Son    Seizures Son    Colon cancer Neg Hx    Esophageal cancer Neg Hx    Stomach cancer Neg Hx    Rectal cancer Neg Hx      Review of Systems  Musculoskeletal:  Positive for arthralgias.  All other systems reviewed and are negative.   Objective:  Physical Exam Constitutional:      General: She is not in acute distress.    Appearance: Normal appearance.  HENT:     Head: Normocephalic and atraumatic.  Eyes:     Extraocular Movements: Extraocular movements intact.     Pupils: Pupils are equal, round, and reactive to light.  Cardiovascular:     Rate and Rhythm: Normal rate and regular rhythm.     Pulses: Normal pulses.     Heart sounds: Normal heart sounds.  Pulmonary:     Effort: Pulmonary effort is normal. No respiratory distress.     Breath sounds: Normal breath sounds. No wheezing.  Abdominal:     General: Abdomen is flat. Bowel sounds are normal. There is no distension.     Palpations: Abdomen is soft.     Tenderness: There is no abdominal tenderness.  Musculoskeletal:     Cervical back: Normal range of motion and neck supple.     Left knee: Swelling and bony tenderness present. No effusion or erythema. Tenderness present over the medial joint line.  Lymphadenopathy:     Cervical: No cervical adenopathy.  Skin:    General: Skin is warm and dry.     Findings: No erythema or rash.  Neurological:     General:  No focal deficit present.     Mental Status: She is alert and oriented to person, place, and time.  Psychiatric:        Mood and Affect: Mood normal.        Behavior: Behavior normal.     Vital signs in last 24 hours: '@VSRANGES'$ @  Labs:   Estimated body mass index is 29.62 kg/m as calculated from the following:   Height as of 09/26/21: '5\' 5"'$  (1.651 m).   Weight as of 09/26/21: 80.7 kg.  Imaging Review Plain radiographs demonstrate moderate degenerative joint disease of the left knee(s). The overall alignment ismild varus. The bone quality appears to be good for age and reported activity level.      Assessment/Plan:  End stage arthritis, left knee   The patient history, physical examination, clinical judgment of the provider and imaging studies are consistent with end stage degenerative joint disease of the left knee(s) and total knee arthroplasty is deemed medically necessary. The treatment options including medical management, injection therapy arthroscopy and arthroplasty were discussed at length. The risks and benefits of total knee arthroplasty were presented and reviewed. The risks due to aseptic loosening, infection, stiffness, patella tracking problems, thromboembolic complications and other imponderables were discussed. The patient acknowledged the explanation, agreed to proceed with the plan and consent was signed. Patient is being admitted for inpatient treatment for surgery, pain control, PT, OT, prophylactic antibiotics, VTE prophylaxis, progressive ambulation and ADL's and discharge planning. The patient is planning to be discharged  home with outpt PT    Anticipated LOS equal to or greater than 2 midnights due to - Age 37 and older with one or more of the following:  - Obesity  - Expected need for hospital services (PT, OT, Nursing) required for safe  discharge  - Anticipated need for postoperative skilled nursing care or inpatient rehab  - Active co-morbidities:  Diabetes and Coronary Artery Disease OR   - Unanticipated findings during/Post Surgery: None  - Patient is a high risk of re-admission due to: None

## 2021-11-19 NOTE — H&P (View-Only) (Signed)
TOTAL KNEE ADMISSION H&P  Patient is being admitted for left total knee arthroplasty.  Subjective:  Chief Complaint:left knee pain.  HPI: Krystal Gibson, 66 y.o. female, has a history of pain and functional disability in the left knee due to arthritis and has failed non-surgical conservative treatments for greater than 12 weeks to includeNSAID's and/or analgesics, corticosteriod injections, viscosupplementation injections, use of assistive devices, weight reduction as appropriate, and activity modification.  Onset of symptoms was gradual, starting 7 years ago with gradually worsening course since that time. The patient noted prior procedures on the knee to include  arthroscopy and menisectomy on the left knee(s).  Patient currently rates pain in the left knee(s) at 9 out of 10 with activity. Patient has night pain, worsening of pain with activity and weight bearing, pain that interferes with activities of daily living, pain with passive range of motion, crepitus, and joint swelling.  Patient has evidence of periarticular osteophytes and joint space narrowing by imaging studies. There is no active infection.  Patient Active Problem List   Diagnosis Date Noted   Elevated coronary artery calcium score 04/22/2019   Coronary artery disease involving native coronary artery of native heart without angina pectoris 04/22/2019   Family history of early CAD 03/11/2019   Essential hypertension 09/11/2018   Diabetes type 2, uncontrolled 06/10/2018   Type 2 diabetes mellitus ([place]) 06/10/2018   Sleep apnea 06/10/2018   Bilateral leg edema 06/10/2018   Dyspnea on exertion 06/10/2018   Statin intolerance 02/25/2017   Mixed hyperlipidemia 02/25/2017   Tobacco use disorder 02/25/2017   Generalized abdominal pain 06/10/2014   Seasonal allergies 01/21/2014   Depression 01/08/2012   Anxiety 06/05/2011   DIVERTICULITIS OF COLON 06/09/2010   IBS 06/09/2010   OBESITY 12/05/2007   HEADACHE, TENSION  12/05/2007   Resistant hypertension 12/05/2007   Diverticulosis of large intestine 12/05/2007   DEGENERATIVE JOINT DISEASE, RIGHT SHOULDER 12/05/2007   Osteoarthrosis involving lower leg 12/05/2007   Past Medical History:  Diagnosis Date   Allergy    Anxiety    Arthritis    AVM (arteriovenous malformation)    of esophagus   Colitis    Colon polyp    Depression    Diverticulitis    Diverticulosis    DJD (degenerative joint disease)    Hyperlipidemia    Hypertension    IBS (irritable bowel syndrome)    Tension headache     Past Surgical History:  Procedure Laterality Date   ABDOMINAL HYSTERECTOMY     BREAST EXCISIONAL BIOPSY     BREAST SURGERY     BUNIONECTOMY Left 2000   and toe nail removal   CESAREAN SECTION     COLONOSCOPY     KNEE SURGERY Left 11/30/2020   REDUCTION MAMMAPLASTY Bilateral    1980   SHOULDER SURGERY Right    TOENAIL EXCISION Right 05/11/2020   TUBAL LIGATION     UPPER GASTROINTESTINAL ENDOSCOPY      Current Outpatient Medications  Medication Sig Dispense Refill Last Dose   amLODipine (NORVASC) 5 MG tablet Take 1 tablet (5 mg total) by mouth daily. 90 tablet 1    atorvastatin (LIPITOR) 20 MG tablet Take 1 tablet (20 mg total) by mouth daily. 90 tablet 3    cholecalciferol (VITAMIN D) 1000 UNITS tablet Take 1,000 Units by mouth daily.      ezetimibe (ZETIA) 10 MG tablet Take 1 tablet (10 mg total) by mouth daily. 90 tablet 3    glycopyrrolate (ROBINUL) 1 MG  tablet Take 1 tablet (1 mg total) by mouth 2 (two) times daily. 180 tablet 3    lisinopril-hydrochlorothiazide (ZESTORETIC) 10-12.5 MG tablet Take 1 tablet by mouth daily. 90 tablet 1    pantoprazole (PROTONIX) 40 MG tablet Take 1 tablet (40 mg total) by mouth daily. 90 tablet 3    pregabalin (LYRICA) 100 MG capsule Take 100 mg by mouth at bedtime.      No current facility-administered medications for this visit.   Allergies  Allergen Reactions   Sulfa Antibiotics Swelling    Eyes swollen  and redness    Social History   Tobacco Use   Smoking status: Some Days    Packs/day: 0.25    Years: 10.00    Total pack years: 2.50    Types: Cigarettes   Smokeless tobacco: Never  Substance Use Topics   Alcohol use: No    Comment: stopped 1990    Family History  Problem Relation Age of Onset   Diabetes Mother    Hyperlipidemia Mother    Cervical cancer Mother    CAD Sister        Coronary stent in 16s   Diabetes Brother    Hyperlipidemia Brother    Kidney disease Brother    Diverticulosis Sister    Breast cancer Other    Multiple sclerosis Son    Seizures Son    Colon cancer Neg Hx    Esophageal cancer Neg Hx    Stomach cancer Neg Hx    Rectal cancer Neg Hx      Review of Systems  Musculoskeletal:  Positive for arthralgias.  All other systems reviewed and are negative.   Objective:  Physical Exam Constitutional:      General: She is not in acute distress.    Appearance: Normal appearance.  HENT:     Head: Normocephalic and atraumatic.  Eyes:     Extraocular Movements: Extraocular movements intact.     Pupils: Pupils are equal, round, and reactive to light.  Cardiovascular:     Rate and Rhythm: Normal rate and regular rhythm.     Pulses: Normal pulses.     Heart sounds: Normal heart sounds.  Pulmonary:     Effort: Pulmonary effort is normal. No respiratory distress.     Breath sounds: Normal breath sounds. No wheezing.  Abdominal:     General: Abdomen is flat. Bowel sounds are normal. There is no distension.     Palpations: Abdomen is soft.     Tenderness: There is no abdominal tenderness.  Musculoskeletal:     Cervical back: Normal range of motion and neck supple.     Left knee: Swelling and bony tenderness present. No effusion or erythema. Tenderness present over the medial joint line.  Lymphadenopathy:     Cervical: No cervical adenopathy.  Skin:    General: Skin is warm and dry.     Findings: No erythema or rash.  Neurological:     General:  No focal deficit present.     Mental Status: She is alert and oriented to person, place, and time.  Psychiatric:        Mood and Affect: Mood normal.        Behavior: Behavior normal.     Vital signs in last 24 hours: '@VSRANGES'$ @  Labs:   Estimated body mass index is 29.62 kg/m as calculated from the following:   Height as of 09/26/21: '5\' 5"'$  (1.651 m).   Weight as of 09/26/21: 80.7 kg.  Imaging Review Plain radiographs demonstrate moderate degenerative joint disease of the left knee(s). The overall alignment ismild varus. The bone quality appears to be good for age and reported activity level.      Assessment/Plan:  End stage arthritis, left knee   The patient history, physical examination, clinical judgment of the provider and imaging studies are consistent with end stage degenerative joint disease of the left knee(s) and total knee arthroplasty is deemed medically necessary. The treatment options including medical management, injection therapy arthroscopy and arthroplasty were discussed at length. The risks and benefits of total knee arthroplasty were presented and reviewed. The risks due to aseptic loosening, infection, stiffness, patella tracking problems, thromboembolic complications and other imponderables were discussed. The patient acknowledged the explanation, agreed to proceed with the plan and consent was signed. Patient is being admitted for inpatient treatment for surgery, pain control, PT, OT, prophylactic antibiotics, VTE prophylaxis, progressive ambulation and ADL's and discharge planning. The patient is planning to be discharged  home with outpt PT    Anticipated LOS equal to or greater than 2 midnights due to - Age 37 and older with one or more of the following:  - Obesity  - Expected need for hospital services (PT, OT, Nursing) required for safe  discharge  - Anticipated need for postoperative skilled nursing care or inpatient rehab  - Active co-morbidities:  Diabetes and Coronary Artery Disease OR   - Unanticipated findings during/Post Surgery: None  - Patient is a high risk of re-admission due to: None

## 2021-11-24 ENCOUNTER — Encounter (HOSPITAL_COMMUNITY): Payer: Self-pay

## 2021-11-24 NOTE — Patient Instructions (Addendum)
DUE TO COVID-19 ONLY TWO VISITORS  (aged 68 and older)  ARE ALLOWED TO COME WITH YOU AND STAY IN THE WAITING ROOM ONLY DURING PRE OP AND PROCEDURE.   **NO VISITORS ARE ALLOWED IN THE SHORT STAY AREA OR RECOVERY ROOM!!**  IF YOU WILL BE ADMITTED INTO THE HOSPITAL YOU ARE ALLOWED ONLY FOUR SUPPORT PEOPLE DURING VISITATION HOURS ONLY (7 AM -8PM)   The support person(s) must pass our screening, gel in and out, and wear a mask at all times, including in the patient's room. Patients must also wear a mask when staff or their support person are in the room. Visitors GUEST BADGE MUST BE WORN VISIBLY  One adult visitor may remain with you overnight and MUST be in the room by 8 P.M.     Your procedure is scheduled on: 12/08/21   Report to [facility] Main Entrance    Report to admitting at   5:15 AM   Call this number if you have problems the morning of surgery [phone]   Do not eat food :After Midnight.   After Midnight you may have the following liquids until 4:30______ AM/  DAY OF SURGERY  Water Black Coffee (sugar ok, NO MILK/CREAM OR CREAMERS)  Tea (sugar ok, NO MILK/CREAM OR CREAMERS) regular and decaf                             Plain Jell-O (NO RED)                                           Fruit ices (not with fruit pulp, NO RED)                                     Popsicles (NO RED)                                                                  Juice: apple, WHITE grape, WHITE cranberry Sports drinks like Gatorade (NO RED)                   The day of surgery:  Drink ONE (1) Pre-Surgery Clear Ensure at  4:15 AM the morning of surgery. Drink in one sitting. Do not sip.  This drink was given to you during your hospital  pre-op appointment visit. Nothing else to drink after completing the  Pre-Surgery Clear Ensure at 4:30 AM          If you have questions, please contact your surgeon's office.       Oral Hygiene is also important to reduce your risk of  infection.                                    Remember - BRUSH YOUR TEETH THE MORNING OF SURGERY WITH YOUR REGULAR TOOTHPASTE    Take these medicines the morning of surgery with A SIP OF WATER: Atorvastatin, Zetia, Amlodipine, Pantoprazole  Do Not Smoke  after midnight                              You may not have any metal on your body including hair pins, jewelry, and body piercing             Do not wear make-up, lotions, powders, perfumes/cologne, or deodorant  Do not wear nail polish including gel and S&S, artificial/acrylic nails, or any other type of covering on natural nails including finger and toenails. If you have artificial nails, gel coating, etc. that needs to be removed by a nail salon please have this removed prior to surgery or surgery may need to be canceled/ delayed if the surgeon/ anesthesia feels like they are unable to be safely monitored.   Do not shave  48 hours prior to surgery.     Do not bring valuables to the hospital. [place].   Contacts, dentures or bridgework may not be worn into surgery.      Patients discharged on the day of surgery will not be allowed to drive home.  Someone NEEDS to stay with you for the first 24 hours after anesthesia.   Special Instructions: Bring a copy of your healthcare power of attorney and living will documents  the day of surgery if you haven't scanned them before.              Please read over the following fact sheets you were given: IF YOU HAVE QUESTIONS ABOUT YOUR PRE-OP INSTRUCTIONS PLEASE CALL [phone]     [facility] - Preparing for Surgery Before surgery, you can play an important role.  Because skin is not sterile, your skin needs to be as free of germs as possible.  You can reduce the number of germs on your skin by washing with CHG (chlorahexidine gluconate) soap before surgery.  CHG is an antiseptic cleaner which kills germs and bonds with the skin to continue  killing germs even after washing. Please DO NOT use if you have an allergy to CHG or antibacterial soaps.  If your skin becomes reddened/irritated stop using the CHG and inform your nurse when you arrive at Short Stay. Do not shave (including legs and underarms) for at least 48 hours prior to the first CHG shower.   Please follow these instructions carefully:  1.  Shower with CHG Soap the night before surgery and the  morning of Surgery.  2.  If you choose to wash your hair, wash your hair first as usual with your  normal  shampoo.  3.  After you shampoo, rinse your hair and body thoroughly to remove the  shampoo.                            4.  Use CHG as you would any other liquid soap.  You can apply chg directly  to the skin and wash                       Gently with a scrungie or clean washcloth.  5.  Apply the CHG Soap to your body ONLY FROM THE NECK DOWN.   Do not use on face/ open  Wound or open sores. Avoid contact with eyes, ears mouth and genitals (private parts).                       Wash face,  Genitals (private parts) with your normal soap.             6.  Wash thoroughly, paying special attention to the area where your surgery  will be performed.  7.  Thoroughly rinse your body with warm water from the neck down.  8.  DO NOT shower/wash with your normal soap after using and rinsing off  the CHG Soap.                9.  Pat yourself dry with a clean towel.            10.  Wear clean pajamas.            11.  Place clean sheets on your bed the night of your first shower and do not  sleep with pets. Day of Surgery : Do not apply any lotions/deodorants the morning of surgery.  Please wear clean clothes to the hospital/surgery center.  FAILURE TO FOLLOW THESE INSTRUCTIONS MAY RESULT IN THE CANCELLATION OF YOUR SURGERY     ________________________________________________________________________   Incentive Spirometer  An incentive spirometer is a tool  that can help keep your lungs clear and active. This tool measures how well you are filling your lungs with each breath. Taking long deep breaths may help reverse or decrease the chance of developing breathing (pulmonary) problems (especially infection) following: A long period of time when you are unable to move or be active. BEFORE THE PROCEDURE  If the spirometer includes an indicator to show your best effort, your nurse or respiratory therapist will set it to a desired goal. If possible, sit up straight or lean slightly forward. Try not to slouch. Hold the incentive spirometer in an upright position. INSTRUCTIONS FOR USE  Sit on the edge of your bed if possible, or sit up as far as you can in bed or on a chair. Hold the incentive spirometer in an upright position. Breathe out normally. Place the mouthpiece in your mouth and seal your lips tightly around it. Breathe in slowly and as deeply as possible, raising the piston or the ball toward the top of the column. Hold your breath for 3-5 seconds or for as long as possible. Allow the piston or ball to fall to the bottom of the column. Remove the mouthpiece from your mouth and breathe out normally. Rest for a few seconds and repeat Steps 1 through 7 at least 10 times every 1-2 hours when you are awake. Take your time and take a few normal breaths between deep breaths. The spirometer may include an indicator to show your best effort. Use the indicator as a goal to work toward during each repetition. After each set of 10 deep breaths, practice coughing to be sure your lungs are clear. If you have an incision (the cut made at the time of surgery), support your incision when coughing by placing a pillow or rolled up towels firmly against it. Once you are able to get out of bed, walk around indoors and cough well. You may stop using the incentive spirometer when instructed by your caregiver.  RISKS AND COMPLICATIONS Take your time so you do not get  dizzy or light-headed. If you are in pain, you may need to take or ask for  pain medication before doing incentive spirometry. It is harder to take a deep breath if you are having pain. AFTER USE Rest and breathe slowly and easily. It can be helpful to keep track of a log of your progress. Your caregiver can provide you with a simple table to help with this. If you are using the spirometer at home, follow these instructions: [place] IF:  You are having difficultly using the spirometer. You have trouble using the spirometer as often as instructed. Your pain medication is not giving enough relief while using the spirometer. You develop fever of 100.5 F (38.1 C) or higher. SEEK IMMEDIATE MEDICAL CARE IF:  You cough up bloody sputum that had not been present before. You develop fever of 102 F (38.9 C) or greater. You develop worsening pain at or near the incision site. MAKE SURE YOU:  Understand these instructions. Will watch your condition. Will get help right away if you are not doing well or get worse. Document Released: 08/20/2006 Document Revised: 07/02/2011 Document Reviewed: 10/21/2006 [facility] Patient Information 2014 [place], [place].   ________________________________________________________________________

## 2021-11-30 ENCOUNTER — Encounter (HOSPITAL_COMMUNITY): Payer: Self-pay

## 2021-11-30 ENCOUNTER — Other Ambulatory Visit: Payer: Self-pay

## 2021-11-30 ENCOUNTER — Encounter (HOSPITAL_COMMUNITY)
Admission: RE | Admit: 2021-11-30 | Discharge: 2021-11-30 | Disposition: A | Discharge status: Home / Self Care | Payer: Medicare HMO | Source: Ambulatory Visit | Attending: Orthopedic Surgery | Admitting: Orthopedic Surgery

## 2021-11-30 DIAGNOSIS — M25562 Pain in left knee: Secondary | ICD-10-CM | POA: Insufficient documentation

## 2021-11-30 DIAGNOSIS — Z01818 Encounter for other preprocedural examination: Secondary | ICD-10-CM

## 2021-11-30 DIAGNOSIS — G8929 Other chronic pain: Secondary | ICD-10-CM | POA: Diagnosis not present

## 2021-11-30 DIAGNOSIS — Z01812 Encounter for preprocedural laboratory examination: Secondary | ICD-10-CM | POA: Diagnosis present

## 2021-11-30 LAB — COMPREHENSIVE METABOLIC PANEL
ALT: 23 U/L (ref 0–44)
AST: 19 U/L (ref 15–41)
Albumin: 4.4 g/dL (ref 3.5–5.0)
Alkaline Phosphatase: 115 U/L (ref 38–126)
Anion gap: 9 (ref 5–15)
BUN: 18 mg/dL (ref 8–23)
CO2: 25 mmol/L (ref 22–32)
Calcium: 9.6 mg/dL (ref 8.9–10.3)
Chloride: 108 mmol/L (ref 98–111)
Creatinine, Ser: 0.72 mg/dL (ref 0.44–1.00)
GFR, Estimated: >60 mL/min (ref >60)
Glucose, Bld: 97 mg/dL (ref 70–99)
Potassium: 3.8 mmol/L (ref 3.5–5.1)
Sodium: 142 mmol/L (ref 135–145)
Total Bilirubin: 1.7 mg/dL — ABNORMAL HIGH (ref 0.3–1.2)
Total Protein: 7.7 g/dL (ref 6.5–8.1)

## 2021-11-30 LAB — CBC WITH DIFFERENTIAL/PLATELET
Abs Immature Granulocytes: 0.02 10*3/uL (ref 0.00–0.07)
Basophils Absolute: 0 10*3/uL (ref 0.0–0.1)
Basophils Relative: 0 %
Eosinophils Absolute: 0.2 10*3/uL (ref 0.0–0.5)
Eosinophils Relative: 2 %
HCT: 44.7 % (ref 36.0–46.0)
Hemoglobin: 14.9 g/dL (ref 12.0–15.0)
Immature Granulocytes: 0 %
Lymphocytes Relative: 29 %
Lymphs Abs: 2.9 10*3/uL (ref 0.7–4.0)
MCH: 29.6 pg (ref 26.0–34.0)
MCHC: 33.3 g/dL (ref 30.0–36.0)
MCV: 88.7 fL (ref 80.0–100.0)
Monocytes Absolute: 0.6 10*3/uL (ref 0.1–1.0)
Monocytes Relative: 6 %
Neutro Abs: 6.1 10*3/uL (ref 1.7–7.7)
Neutrophils Relative %: 63 %
Platelets: 291 10*3/uL (ref 150–400)
RBC: 5.04 MIL/uL (ref 3.87–5.11)
RDW: 15.2 % (ref 11.5–15.5)
WBC: 9.8 10*3/uL (ref 4.0–10.5)
nRBC: 0 % (ref 0.0–0.2)

## 2021-11-30 LAB — SURGICAL PCR SCREEN
MRSA, PCR: NEGATIVE
Staphylococcus aureus: POSITIVE — AB

## 2021-11-30 NOTE — Progress Notes (Signed)
Anesthesia note:  Bowel prep reminder:NA  PCP - Dr. Rogers Blocker Cardiologist -[place] Other- Pulmonary- Dr. Melvyn Novas  Chest x-ray - no EKG - 03/02/21-epic Stress Test - no ECHO - no Cardiac Cath - no  Pacemaker/ICD device last checked:NA  Sleep Study - yes-negative results CPAP - no  Pt is pre diabetic-NA Fasting Blood Sugar -  Checks Blood Sugar _____  Blood Thinner:NA Blood Thinner Instructions: Aspirin Instructions: Last Dose:  Anesthesia review: no  Patient denies shortness of breath, fever, cough and chest pain at PAT appointment Pt has no SOB with activities. She has cur her smoking down to 1-2 cigs per day.  Patient verbalized understanding of instructions that were given to them at the PAT appointment. Patient was also instructed that they will need to review over the PAT instructions again at home before surgery. yes

## 2021-12-07 ENCOUNTER — Ambulatory Visit: Payer: Self-pay | Admitting: Physician Assistant

## 2021-12-07 MED ORDER — VANCOMYCIN HCL 10 G IV SOLR
1000.0000 mg | INTRAVENOUS | Status: AC
  Administered 2021-12-08: 1000 mg via INTRAVENOUS

## 2021-12-07 MED ORDER — TRANEXAMIC ACID 1000 MG/10ML IV SOLN
2000.0000 mg | INTRAVENOUS | Status: DC
  Filled 2021-12-07: qty 20

## 2021-12-07 NOTE — Anesthesia Preprocedure Evaluation (Addendum)
Anesthesia Evaluation  Patient identified by MRN, date of birth, ID band Patient awake    Reviewed: Allergy & Precautions, NPO status , Patient's Chart, lab work & pertinent test results  Airway Mallampati: II  TM Distance: >3 FB Neck ROM: Full    Dental no notable dental hx. (+) Partial Upper, Dental Advisory Given   Pulmonary Current Smoker and Patient abstained from smoking.,    Pulmonary exam normal breath sounds clear to auscultation       Cardiovascular hypertension, Normal cardiovascular exam Rhythm:Regular Rate:Normal     Neuro/Psych    GI/Hepatic   Endo/Other  diabetes  Renal/GU      Musculoskeletal  (+) Arthritis , Osteoarthritis,    Abdominal   Peds  Hematology Lab Results      Component                Value               Date                      WBC                      9.8                 11/30/2021                HGB                      14.9                11/30/2021                HCT                      44.7                11/30/2021                MCV                      88.7                11/30/2021                PLT                      291                 11/30/2021              Anesthesia Other Findings All: Sulfa  Reproductive/Obstetrics                            Anesthesia Physical Anesthesia Plan  ASA: 3  Anesthesia Plan: Spinal and Regional   Post-op Pain Management: Minimal or no pain anticipated and Regional block*   Induction: Intravenous  PONV Risk Score and Plan: 2 and Treatment may vary due to age or medical condition and Midazolam  Airway Management Planned: Natural Airway, Simple Face Mask and Nasal Cannula  Additional Equipment: None  Intra-op Plan:   Post-operative Plan:   Informed Consent: I have reviewed the patients History and Physical, chart, labs and discussed the procedure including the risks, benefits and alternatives for the  proposed anesthesia with the patient or authorized representative who  has indicated his/her understanding and acceptance.     Dental advisory given  Plan Discussed with:   Anesthesia Plan Comments: (Spinal W L adductor)       Anesthesia Quick Evaluation

## 2021-12-08 ENCOUNTER — Ambulatory Visit (HOSPITAL_COMMUNITY): Payer: Medicare HMO | Admitting: Anesthesiology

## 2021-12-08 ENCOUNTER — Ambulatory Visit (HOSPITAL_BASED_OUTPATIENT_CLINIC_OR_DEPARTMENT_OTHER): Payer: Medicare HMO | Admitting: Anesthesiology

## 2021-12-08 ENCOUNTER — Encounter (HOSPITAL_COMMUNITY): Payer: Self-pay | Admitting: Orthopedic Surgery

## 2021-12-08 ENCOUNTER — Encounter (HOSPITAL_COMMUNITY)
Admission: RE | Disposition: A | Discharge status: Home / Self Care | Payer: Self-pay | Source: Ambulatory Visit | Attending: Orthopedic Surgery

## 2021-12-08 ENCOUNTER — Ambulatory Visit (HOSPITAL_COMMUNITY)
Admission: RE | Admit: 2021-12-08 | Discharge: 2021-12-08 | Disposition: A | Discharge status: Home / Self Care | Payer: Medicare HMO | Source: Ambulatory Visit | Attending: Orthopedic Surgery | Admitting: Orthopedic Surgery

## 2021-12-08 ENCOUNTER — Other Ambulatory Visit: Payer: Self-pay

## 2021-12-08 DIAGNOSIS — E119 Type 2 diabetes mellitus without complications: Secondary | ICD-10-CM | POA: Insufficient documentation

## 2021-12-08 DIAGNOSIS — M24562 Contracture, left knee: Secondary | ICD-10-CM | POA: Diagnosis not present

## 2021-12-08 DIAGNOSIS — M1712 Unilateral primary osteoarthritis, left knee: Secondary | ICD-10-CM

## 2021-12-08 DIAGNOSIS — I10 Essential (primary) hypertension: Secondary | ICD-10-CM

## 2021-12-08 DIAGNOSIS — G8918 Other acute postprocedural pain: Secondary | ICD-10-CM | POA: Diagnosis not present

## 2021-12-08 DIAGNOSIS — M21262 Flexion deformity, left knee: Secondary | ICD-10-CM | POA: Diagnosis not present

## 2021-12-08 DIAGNOSIS — R69 Illness, unspecified: Secondary | ICD-10-CM | POA: Diagnosis not present

## 2021-12-08 DIAGNOSIS — Z01818 Encounter for other preprocedural examination: Secondary | ICD-10-CM

## 2021-12-08 DIAGNOSIS — F17210 Nicotine dependence, cigarettes, uncomplicated: Secondary | ICD-10-CM | POA: Insufficient documentation

## 2021-12-08 DIAGNOSIS — M21162 Varus deformity, not elsewhere classified, left knee: Secondary | ICD-10-CM | POA: Diagnosis not present

## 2021-12-08 DIAGNOSIS — Z96652 Presence of left artificial knee joint: Secondary | ICD-10-CM | POA: Diagnosis not present

## 2021-12-08 HISTORY — PX: TOTAL KNEE ARTHROPLASTY: SHX125

## 2021-12-08 LAB — TYPE AND SCREEN
ABO/RH(D): O POS
Antibody Screen: NEGATIVE

## 2021-12-08 LAB — ABO/RH: ABO/RH(D): O POS

## 2021-12-08 SURGERY — ARTHROPLASTY, KNEE, TOTAL
Anesthesia: Regional | Site: Knee | Laterality: Left

## 2021-12-08 MED ORDER — CLONIDINE HCL (ANALGESIA) 100 MCG/ML EP SOLN
EPIDURAL | Status: DC | PRN
  Administered 2021-12-08: 100 ug

## 2021-12-08 MED ORDER — SODIUM CHLORIDE (PF) 0.9 % IJ SOLN
INTRAMUSCULAR | Status: AC
  Filled 2021-12-08: qty 50

## 2021-12-08 MED ORDER — TRANEXAMIC ACID-NACL 1000-0.7 MG/100ML-% IV SOLN
1000.0000 mg | INTRAVENOUS | Status: AC
  Administered 2021-12-08: 1000 mg via INTRAVENOUS
  Filled 2021-12-08: qty 100

## 2021-12-08 MED ORDER — BUPIVACAINE LIPOSOME 1.3 % IJ SUSP
INTRAMUSCULAR | Status: AC
  Filled 2021-12-08: qty 20

## 2021-12-08 MED ORDER — FENTANYL CITRATE (PF) 100 MCG/2ML IJ SOLN
INTRAMUSCULAR | Status: DC | PRN
Start: 2021-12-08 — End: 2021-12-08
  Administered 2021-12-08 (×2): 50 ug via INTRAVENOUS

## 2021-12-08 MED ORDER — SODIUM CHLORIDE 0.9 % IV SOLN: INTRAVENOUS | Status: DC

## 2021-12-08 MED ORDER — LACTATED RINGERS IV BOLUS
500.0000 mL | Freq: Once | INTRAVENOUS | Status: AC
  Administered 2021-12-08: 500 mL via INTRAVENOUS

## 2021-12-08 MED ORDER — SODIUM CHLORIDE 0.9 % IR SOLN
Status: DC | PRN
  Administered 2021-12-08: 3000 mL

## 2021-12-08 MED ORDER — PROPOFOL 1000 MG/100ML IV EMUL
INTRAVENOUS | Status: AC
  Filled 2021-12-08: qty 100

## 2021-12-08 MED ORDER — ORAL CARE MOUTH RINSE: 15.0000 mL | Freq: Once | OROMUCOSAL | Status: AC

## 2021-12-08 MED ORDER — MIDAZOLAM HCL 2 MG/2ML IJ SOLN
INTRAMUSCULAR | Status: DC | PRN
  Administered 2021-12-08: 2 mg via INTRAVENOUS

## 2021-12-08 MED ORDER — BUPIVACAINE-EPINEPHRINE 0.5% -1:200000 IJ SOLN
INTRAMUSCULAR | Status: DC | PRN
  Administered 2021-12-08: 30 mL

## 2021-12-08 MED ORDER — PHENYLEPHRINE 80 MCG/ML (10ML) SYRINGE FOR IV PUSH (FOR BLOOD PRESSURE SUPPORT)
PREFILLED_SYRINGE | INTRAVENOUS | Status: DC | PRN
  Administered 2021-12-08: 120 ug via INTRAVENOUS
  Administered 2021-12-08 (×2): 80 ug via INTRAVENOUS

## 2021-12-08 MED ORDER — 0.9 % SODIUM CHLORIDE (POUR BTL) OPTIME
TOPICAL | Status: DC | PRN
  Administered 2021-12-08: 1000 mL

## 2021-12-08 MED ORDER — OXYCODONE HCL 5 MG PO TABS
ORAL_TABLET | ORAL | Status: AC
  Filled 2021-12-08: qty 1

## 2021-12-08 MED ORDER — BUPIVACAINE IN DEXTROSE 0.75-8.25 % IT SOLN
INTRATHECAL | Status: DC | PRN
  Administered 2021-12-08: 12 mg via INTRATHECAL

## 2021-12-08 MED ORDER — OXYCODONE HCL 5 MG PO TABS
5.0000 mg | ORAL_TABLET | ORAL | Status: DC | PRN
  Administered 2021-12-08: 5 mg via ORAL

## 2021-12-08 MED ORDER — PHENYLEPHRINE HCL (PRESSORS) 10 MG/ML IV SOLN
INTRAVENOUS | Status: AC
  Filled 2021-12-08: qty 1

## 2021-12-08 MED ORDER — ACETAMINOPHEN 500 MG PO TABS
1000.0000 mg | ORAL_TABLET | Freq: Once | ORAL | Status: AC
  Administered 2021-12-08: 1000 mg via ORAL
  Filled 2021-12-08: qty 2

## 2021-12-08 MED ORDER — CEFAZOLIN SODIUM-DEXTROSE 2-4 GM/100ML-% IV SOLN
2.0000 g | INTRAVENOUS | Status: AC
  Administered 2021-12-08: 2 g via INTRAVENOUS
  Filled 2021-12-08: qty 100

## 2021-12-08 MED ORDER — SODIUM CHLORIDE 0.9% FLUSH
INTRAVENOUS | Status: DC | PRN
  Administered 2021-12-08: 30 mL

## 2021-12-08 MED ORDER — GLYCOPYRROLATE 0.2 MG/ML IJ SOLN
INTRAMUSCULAR | Status: AC
  Filled 2021-12-08: qty 1

## 2021-12-08 MED ORDER — PROPOFOL 500 MG/50ML IV EMUL
INTRAVENOUS | Status: DC | PRN
  Administered 2021-12-08: 30 mg via INTRAVENOUS
  Administered 2021-12-08: 75 ug/kg/min via INTRAVENOUS

## 2021-12-08 MED ORDER — ASPIRIN 81 MG PO TBEC: 81.0000 mg | DELAYED_RELEASE_TABLET | Freq: Two times a day (BID) | ORAL | 0 refills | Status: AC

## 2021-12-08 MED ORDER — PHENYLEPHRINE HCL-NACL 20-0.9 MG/250ML-% IV SOLN
INTRAVENOUS | Status: DC | PRN
  Administered 2021-12-08: 50 ug/min via INTRAVENOUS

## 2021-12-08 MED ORDER — BUPIVACAINE LIPOSOME 1.3 % IJ SUSP: 20.0000 mL | Freq: Once | INTRAMUSCULAR | Status: DC

## 2021-12-08 MED ORDER — WATER FOR IRRIGATION, STERILE IR SOLN
Status: DC | PRN
  Administered 2021-12-08: 2000 mL

## 2021-12-08 MED ORDER — ONDANSETRON HCL 4 MG/2ML IJ SOLN
INTRAMUSCULAR | Status: AC
  Filled 2021-12-08: qty 2

## 2021-12-08 MED ORDER — GLYCOPYRROLATE 0.2 MG/ML IJ SOLN
INTRAMUSCULAR | Status: DC | PRN
  Administered 2021-12-08: .1 mg via INTRAVENOUS

## 2021-12-08 MED ORDER — LACTATED RINGERS IV BOLUS
250.0000 mL | Freq: Once | INTRAVENOUS | Status: AC
  Administered 2021-12-08: 250 mL via INTRAVENOUS

## 2021-12-08 MED ORDER — LACTATED RINGERS IV SOLN
INTRAVENOUS | Status: DC
  Administered 2021-12-08: 1000 mL via INTRAVENOUS

## 2021-12-08 MED ORDER — FENTANYL CITRATE (PF) 100 MCG/2ML IJ SOLN
INTRAMUSCULAR | Status: AC
  Filled 2021-12-08: qty 2

## 2021-12-08 MED ORDER — MIDAZOLAM HCL 2 MG/2ML IJ SOLN
INTRAMUSCULAR | Status: AC
  Filled 2021-12-08: qty 2

## 2021-12-08 MED ORDER — CHLORHEXIDINE GLUCONATE 0.12 % MT SOLN
15.0000 mL | Freq: Once | OROMUCOSAL | Status: AC
  Administered 2021-12-08: 15 mL via OROMUCOSAL

## 2021-12-08 MED ORDER — VANCOMYCIN HCL IN DEXTROSE 1-5 GM/200ML-% IV SOLN
INTRAVENOUS | Status: AC
  Filled 2021-12-08: qty 200

## 2021-12-08 MED ORDER — TRANEXAMIC ACID-NACL 1000-0.7 MG/100ML-% IV SOLN
INTRAVENOUS | Status: AC
  Filled 2021-12-08: qty 100

## 2021-12-08 MED ORDER — TRANEXAMIC ACID-NACL 1000-0.7 MG/100ML-% IV SOLN
1000.0000 mg | Freq: Once | INTRAVENOUS | Status: AC
  Administered 2021-12-08: 1000 mg via INTRAVENOUS

## 2021-12-08 MED ORDER — TOBRAMYCIN SULFATE 1.2 G IJ SOLR
INTRAMUSCULAR | Status: AC
  Filled 2021-12-08: qty 2.4

## 2021-12-08 MED ORDER — DEXAMETHASONE SODIUM PHOSPHATE 10 MG/ML IJ SOLN
INTRAMUSCULAR | Status: AC
  Filled 2021-12-08: qty 1

## 2021-12-08 MED ORDER — ROPIVACAINE HCL 5 MG/ML IJ SOLN
INTRAMUSCULAR | Status: DC | PRN
  Administered 2021-12-08: 30 mL via PERINEURAL

## 2021-12-08 MED ORDER — EPHEDRINE SULFATE-NACL 50-0.9 MG/10ML-% IV SOSY
PREFILLED_SYRINGE | INTRAVENOUS | Status: DC | PRN
  Administered 2021-12-08: 5 mg via INTRAVENOUS

## 2021-12-08 MED ORDER — POVIDONE-IODINE 10 % EX SWAB
2.0000 | Freq: Once | CUTANEOUS | Status: AC
  Administered 2021-12-08: 2 via TOPICAL

## 2021-12-08 MED ORDER — BUPIVACAINE LIPOSOME 1.3 % IJ SUSP
INTRAMUSCULAR | Status: DC | PRN
  Administered 2021-12-08: 20 mL

## 2021-12-08 MED ORDER — VANCOMYCIN HCL IN DEXTROSE 1-5 GM/200ML-% IV SOLN: 1000.0000 mg | Freq: Two times a day (BID) | INTRAVENOUS | Status: DC

## 2021-12-08 MED ORDER — TRANEXAMIC ACID 1000 MG/10ML IV SOLN
INTRAVENOUS | Status: DC | PRN
  Administered 2021-12-08: 2000 mg via TOPICAL

## 2021-12-08 MED ORDER — ACETAMINOPHEN 500 MG PO TABS
1000.0000 mg | ORAL_TABLET | Freq: Four times a day (QID) | ORAL | 2 refills | Status: DC | PRN
Start: 2021-12-08 — End: 2022-06-25

## 2021-12-08 MED ORDER — BUPIVACAINE-EPINEPHRINE (PF) 0.5% -1:200000 IJ SOLN
INTRAMUSCULAR | Status: AC
  Filled 2021-12-08: qty 30

## 2021-12-08 MED ORDER — OXYCODONE HCL 5 MG PO TABS: ORAL_TABLET | ORAL | 0 refills | Status: DC

## 2021-12-08 SURGICAL SUPPLY — 60 items
ATTUNE MED DOME PAT 38 KNEE (Knees) IMPLANT
ATTUNE PS FEM LT SZ 4 CEM KNEE (Femur) IMPLANT
ATTUNE PSRP INSR SZ4 5 KNEE (Insert) IMPLANT
BAG COUNTER SPONGE SURGICOUNT (BAG) ×2 IMPLANT
BAG DECANTER FOR FLEXI CONT (MISCELLANEOUS) ×2 IMPLANT
BAG SPEC THK2 15X12 ZIP CLS (MISCELLANEOUS) ×1
BAG SPNG CNTER NS LX DISP (BAG) ×1
BAG ZIPLOCK 12X15 (MISCELLANEOUS) ×2 IMPLANT
BASEPLATE TIBIAL ROTATING SZ 4 (Knees) IMPLANT
BLADE SAGITTAL 25.0X1.19X90 (BLADE) ×2 IMPLANT
BLADE SAW SGTL 13X75X1.27 (BLADE) ×2 IMPLANT
BLADE SURG 15 STRL LF DISP TIS (BLADE) ×2 IMPLANT
BLADE SURG 15 STRL SS (BLADE) ×1
BLADE SURG SZ10 CARB STEEL (BLADE) ×4 IMPLANT
BNDG CMPR MED 15X6 ELC VLCR LF (GAUZE/BANDAGES/DRESSINGS) ×1
BNDG ELASTIC 6X15 VLCR STRL LF (GAUZE/BANDAGES/DRESSINGS) ×2 IMPLANT
BONE CEMENT GENTAMICIN (Cement) ×2 IMPLANT
BOWL SMART MIX CTS (DISPOSABLE) ×2 IMPLANT
BSPLAT TIB 4 CMNT ROT PLAT STR (Knees) ×1 IMPLANT
CEMENT BONE GENTAMICIN 40 (Cement) IMPLANT
CLSR STERI-STRIP ANTIMIC 1/2X4 (GAUZE/BANDAGES/DRESSINGS) ×4 IMPLANT
COVER SURGICAL LIGHT HANDLE (MISCELLANEOUS) ×2 IMPLANT
CUFF TOURN SGL QUICK 34 (TOURNIQUET CUFF) ×1
CUFF TRNQT CYL 34X4.125X (TOURNIQUET CUFF) ×2 IMPLANT
DRAPE INCISE IOBAN 66X45 STRL (DRAPES) ×2 IMPLANT
DRAPE U-SHAPE 47X51 STRL (DRAPES) ×2 IMPLANT
DRESSING AQUACEL AG SP 3.5X10 (GAUZE/BANDAGES/DRESSINGS) ×2 IMPLANT
DRSG AQUACEL AG SP 3.5X10 (GAUZE/BANDAGES/DRESSINGS) ×1
DURAPREP 26ML APPLICATOR (WOUND CARE) ×4 IMPLANT
ELECT REM PT RETURN 15FT ADLT (MISCELLANEOUS) ×2 IMPLANT
GLOVE BIOGEL PI IND STRL 8 (GLOVE) ×4 IMPLANT
GLOVE BIOGEL PI INDICATOR 8 (GLOVE) ×2
GLOVE SURG ORTHO 8.0 STRL STRW (GLOVE) ×2 IMPLANT
GLOVE SURG POLYISO LF SZ7.5 (GLOVE) ×2 IMPLANT
GOWN STRL REUS W/ TWL XL LVL3 (GOWN DISPOSABLE) ×4 IMPLANT
GOWN STRL REUS W/TWL XL LVL3 (GOWN DISPOSABLE) ×2
HANDPIECE INTERPULSE COAX TIP (DISPOSABLE) ×1
HOLDER FOLEY CATH W/STRAP (MISCELLANEOUS) IMPLANT
HOOD PEEL AWAY FLYTE STAYCOOL (MISCELLANEOUS) ×2 IMPLANT
IMMOBILIZER KNEE 20 (SOFTGOODS) ×1
IMMOBILIZER KNEE 20 THIGH 36 (SOFTGOODS) ×2 IMPLANT
KIT TURNOVER KIT A (KITS) IMPLANT
MANIFOLD NEPTUNE II (INSTRUMENTS) ×2 IMPLANT
NEEDLE HYPO 22GX1.5 SAFETY (NEEDLE) ×4 IMPLANT
NS IRRIG 1000ML POUR BTL (IV SOLUTION) ×2 IMPLANT
PACK TOTAL KNEE CUSTOM (KITS) ×2 IMPLANT
PROTECTOR NERVE ULNAR (MISCELLANEOUS) ×2 IMPLANT
SET HNDPC FAN SPRY TIP SCT (DISPOSABLE) ×2 IMPLANT
SPIKE FLUID TRANSFER (MISCELLANEOUS) ×4 IMPLANT
SUT ETHIBOND NAB CT1 #1 30IN (SUTURE) ×4 IMPLANT
SUT MNCRL AB 3-0 PS2 18 (SUTURE) ×2 IMPLANT
SUT VIC AB 0 CT1 36 (SUTURE) ×2 IMPLANT
SUT VIC AB 2-0 CT1 27 (SUTURE) ×2
SUT VIC AB 2-0 CT1 TAPERPNT 27 (SUTURE) ×4 IMPLANT
SYR CONTROL 10ML LL (SYRINGE) ×6 IMPLANT
TOWEL OR 17X26 10 PK STRL BLUE (TOWEL DISPOSABLE) ×2 IMPLANT
TRAY FOLEY MTR SLVR 16FR STAT (SET/KITS/TRAYS/PACK) ×2 IMPLANT
TUBE SUCTION HIGH CAP CLEAR NV (SUCTIONS) ×2 IMPLANT
WATER STERILE IRR 1000ML POUR (IV SOLUTION) ×4 IMPLANT
WRAP KNEE MAXI GEL POST OP (GAUZE/BANDAGES/DRESSINGS) ×2 IMPLANT

## 2021-12-08 NOTE — Anesthesia Procedure Notes (Signed)
Anesthesia Regional Block: Adductor canal block   Pre-Anesthetic Checklist: , timeout performed,  Correct Patient, Correct Site, Correct Laterality,  Correct Procedure, Correct Position, site marked,  Risks and benefits discussed,  Surgical consent,  Pre-op evaluation,  At surgeon's request and post-op pain management  Laterality: Lower and Left  Prep: chloraprep       Needles:  Injection technique: Single-shot  Needle Type: Echogenic Needle     Needle Length: 9cm  Needle Gauge: 22     Additional Needles:   Procedures:,,,, ultrasound used (permanent image in chart),,    Narrative:  Start time: 12/08/2021 6:54 AM End time: 12/08/2021 6:58 AM Injection made incrementally with aspirations every 5 mL.  Performed by: Personally  Anesthesiologist: Barnet Glasgow, MD  Additional Notes: Block assessed prior to surgery. Pt tolerated procedure well.

## 2021-12-08 NOTE — Anesthesia Procedure Notes (Signed)
Spinal  Patient location during procedure: OR Start time: 12/08/2021 7:35 AM End time: 12/08/2021 7:41 AM Reason for block: surgical anesthesia Staffing Performed: anesthesiologist  Anesthesiologist: Barnet Glasgow, MD Performed by: Barnet Glasgow, MD Authorized by: Barnet Glasgow, MD   Preanesthetic Checklist Completed: patient identified, IV checked, risks and benefits discussed, surgical consent, monitors and equipment checked, pre-op evaluation and timeout performed Spinal Block Patient position: sitting Prep: DuraPrep and site prepped and draped Patient monitoring: heart rate, cardiac monitor, continuous pulse ox and blood pressure Approach: midline Location: L3-4 Injection technique: single-shot Needle Needle type: Pencan  Needle gauge: 24 G Needle length: 10 cm Needle insertion depth: 6 cm Assessment Sensory level: T4 Events: CSF return Additional Notes  1 Attempt (s). Pt tolerated procedure well.

## 2021-12-08 NOTE — Anesthesia Postprocedure Evaluation (Signed)
Anesthesia Post Note  Patient: Krystal Gibson  Procedure(s) Performed: TOTAL KNEE ARTHROPLASTY (Left: Knee)     Patient location during evaluation: Nursing Unit Anesthesia Type: Regional and Spinal Level of consciousness: oriented and awake and alert Pain management: pain level controlled Vital Signs Assessment: post-procedure vital signs reviewed and stable Respiratory status: spontaneous breathing and respiratory function stable Cardiovascular status: blood pressure returned to baseline and stable Postop Assessment: no headache, no backache, no apparent nausea or vomiting and patient able to bend at knees Anesthetic complications: no   No notable events documented.  Last Vitals:  Vitals:   12/08/21 1200 12/08/21 1215  BP: 116/72 124/75  Pulse: (!) 56 (!) 53  Resp: 13 15  Temp:    SpO2: 94% 98%    Last Pain:  Vitals:   12/08/21 1215  TempSrc:   PainSc: [place]

## 2021-12-08 NOTE — Interval H&P Note (Signed)
History and Physical Interval Note:  12/08/2021 7:34 AM  Krystal Gibson  has presented today for surgery, with the diagnosis of OA LEFT KNEE.  The various methods of treatment have been discussed with the patient and family. After consideration of risks, benefits and other options for treatment, the patient has consented to  Procedure(s): TOTAL KNEE ARTHROPLASTY (Left) as a surgical intervention.  The patient's history has been reviewed, patient examined, no change in status, stable for surgery.  I have reviewed the patient's chart and labs.  Questions were answered to the patient's satisfaction.     Yvette Rack

## 2021-12-08 NOTE — Evaluation (Signed)
Physical Therapy Evaluation Patient Details Name: Krystal Gibson MRN: [id number] DOB: [birth year] Today's Date: 12/08/2021  History of Present Illness  Pt is a 66yo female presenting s/p L-TKA on 12/08/21. PMH: anxiety & depression, colitis, HLD, HTN, DM,  Clinical Impression  Krystal Gibson is a 66 y.o. female POD 0 s/p L-TKA. Patient reports modified independence using SPC for mobility at baseline. Patient is now limited by functional impairments (see PT problem list below) and requires min guard for transfers and gait with RW. Patient was able to ambulate 60 feet with RW and min guard and cues for safe walker management. Patient educated on safe sequencing for stair mobility and verbalized safe guarding position for people assisting with mobility. Patient instructed in exercises to facilitate ROM and circulation. Patient will benefit from continued skilled PT interventions to address impairments and progress towards PLOF. Patient has met mobility goals at adequate level for discharge home; will continue to follow if pt continues acute stay to progress towards Mod I goals.        Recommendations for follow up therapy are one component of a multi-disciplinary discharge planning process, led by the attending physician.  Recommendations may be updated based on patient status, additional functional criteria and insurance authorization.  Follow Up Recommendations Follow physician's recommendations for discharge plan and follow up therapies      Assistance Recommended at Discharge Set up Supervision/Assistance  Patient can return home with the following  A little help with walking and/or transfers;A little help with bathing/dressing/bathroom;Assistance with cooking/housework;Assist for transportation;Help with stairs or ramp for entrance    Equipment Recommendations None recommended by PT  Recommendations for Other Services       Functional Status Assessment Patient has had a recent  decline in their functional status and demonstrates the ability to make significant improvements in function in a reasonable and predictable amount of time.     Precautions / Restrictions Precautions Precautions: Fall Required Braces or Orthoses: Knee Immobilizer - Left Knee Immobilizer - Left: On when out of bed or walking Restrictions Weight Bearing Restrictions: No Other Position/Activity Restrictions: WBAT      Mobility  Bed Mobility Overal bed mobility: Modified Independent             General bed mobility comments: increased time    Transfers Overall transfer level: Needs assistance Equipment used: Rolling walker (2 wheels) Transfers: Sit to/from Stand Sit to Stand: Min guard, From elevated surface           General transfer comment: For safety only    Ambulation/Gait Ambulation/Gait assistance: Min guard Gait Distance (Feet): 60 Feet Assistive device: Rolling walker (2 wheels) Gait Pattern/deviations: Step-to pattern Gait velocity: decreased     General Gait Details: Pt ambulated with RW and min guard, no physical assist required or overt LOB noted.  Stairs Stairs: Yes Stairs assistance: Min guard Stair Management: One rail Right, Step to pattern, Sideways Number of Stairs: 4 General stair comments: Pt educated on stair mobility sideways, verbalized understanding. Demonstrated safe technique with VCs, no overt LOB. Handout provided.  Wheelchair Mobility    Modified Rankin (Stroke Patients Only)       Balance Overall balance assessment: Needs assistance Sitting-balance support: Feet supported, No upper extremity supported Sitting balance-Leahy Scale: Good     Standing balance support: Reliant on assistive device for balance, During functional activity, Bilateral upper extremity supported Standing balance-Leahy Scale: Poor  Pertinent Vitals/Pain Pain Assessment Pain Assessment: 0-10 Pain Score: 9   Pain Location: L knee Pain Descriptors / Indicators: Operative site guarding, Burning Pain Intervention(s): Monitored during session, Repositioned, Ice applied, Patient requesting pain meds-RN notified    Home Living Family/patient expects to be discharged to:: Private residence Living Arrangements: Alone Available Help at Discharge: Available 24 hours/day;Friend(s);Family (Friends and Sons) Type of Home: House Home Access: Stairs to enter Entrance Stairs-Rails: Psychiatric nurse of Steps: 4   Home Layout: One level Home Equipment: Grab bars - toilet;Grab bars - tub/shower;Cane - single Barista (2 wheels);Rollator (4 wheels);Crutches;BSC/3in1      Prior Function Prior Level of Function : Independent/Modified Independent;Working/employed;Driving (Part time Secondary school teacher)             Mobility Comments: SPC ADLs Comments: IND     Hand Dominance   Dominant Hand: Right    Extremity/Trunk Assessment   Upper Extremity Assessment Upper Extremity Assessment: Overall WFL for tasks assessed    Lower Extremity Assessment Lower Extremity Assessment: RLE deficits/detail;LLE deficits/detail RLE Deficits / Details: MMT ank DF/PF 5/5 RLE Sensation: WNL LLE Deficits / Details: MMT ank DF/PF 4/5, no extensor lag noted LLE Sensation: WNL    Cervical / Trunk Assessment Cervical / Trunk Assessment: Kyphotic  Communication   Communication: No difficulties  Cognition Arousal/Alertness: Awake/alert Behavior During Therapy: WFL for tasks assessed/performed Overall Cognitive Status: Within Functional Limits for tasks assessed                                          General Comments      Exercises Total Joint Exercises Ankle Circles/Pumps: AROM, Both, 10 reps Quad Sets: AROM, Left, Other reps (comment) (3) Short Arc Quad: AROM, Other reps (comment), Left (3) Heel Slides: AROM, Left, Other reps (comment) (3) Hip  ABduction/ADduction: AROM, Left, Other reps (comment) (3) Straight Leg Raises: AROM, Left, Other reps (comment) (3)   Assessment/Plan    PT Assessment Patient needs continued PT services  PT Problem List Decreased strength;Decreased range of motion;Decreased activity tolerance;Decreased balance;Decreased mobility;Decreased coordination;Pain       PT Treatment Interventions DME instruction;Gait training;Stair training;Functional mobility training;Therapeutic activities;Therapeutic exercise;Balance training;Neuromuscular re-education;Patient/family education    PT Goals (Current goals can be found in the Care Plan section)  Acute Rehab PT Goals Patient Stated Goal: get back to working PT Goal Formulation: With patient Time For Goal Achievement: 12/15/21 Potential to Achieve Goals: Good    Frequency 7X/week     Co-evaluation               AM-PAC PT "6 Clicks" Mobility  Outcome Measure Help needed turning from your back to your side while in a flat bed without using bedrails?: None Help needed moving from lying on your back to sitting on the side of a flat bed without using bedrails?: A Little Help needed moving to and from a bed to a chair (including a wheelchair)?: A Little Help needed standing up from a chair using your arms (e.g., wheelchair or bedside chair)?: A Little Help needed to walk in hospital room?: A Little Help needed climbing 3-5 steps with a railing? : A Little 6 Click Score: 19    End of Session Equipment Utilized During Treatment: Gait belt;Left knee immobilizer Activity Tolerance: Patient tolerated treatment well;No increased pain Patient left: in chair;with call bell/phone within reach Nurse Communication: Mobility status PT Visit  Diagnosis: Pain;Difficulty in walking, not elsewhere classified (R26.2) Pain - Right/Left: Left Pain - part of body: Knee    Time: 1330-1420 PT Time Calculation (min) (ACUTE ONLY): 50 min   Charges:   PT  Evaluation $PT Eval Low Complexity: 1 Low PT Treatments $Gait Training: 8-22 mins $Therapeutic Exercise: 8-22 mins        Coolidge Breeze, PT, DPT [facility] Office: [phone] Pager: [phone]  Coolidge Breeze 12/08/2021, 3:08 PM

## 2021-12-08 NOTE — Discharge Instructions (Signed)

## 2021-12-08 NOTE — Brief Op Note (Signed)
12/08/2021  9:51 AM  PATIENT:  Krystal Gibson  66 y.o. female  PRE-OPERATIVE DIAGNOSIS:  OA LEFT KNEE  POST-OPERATIVE DIAGNOSIS:  OA LEFT KNEE  PROCEDURE:  Procedure(s): TOTAL KNEE ARTHROPLASTY (Left)  SURGEON:  Surgeon(s) and Role:    Earlie Server, MD - Primary  PHYSICIAN ASSISTANT: Chriss Czar, PA-C  ASSISTANTS:    ANESTHESIA:   local, regional, spinal, and IV sedation  EBL:  60 mL   BLOOD ADMINISTERED:none  DRAINS: none   LOCAL MEDICATIONS USED:  MARCAINE     SPECIMEN:  No Specimen  DISPOSITION OF SPECIMEN:  N/A  COUNTS:  YES  TOURNIQUET:   Total Tourniquet Time Documented: Thigh (Left) - 49 minutes Total: Thigh (Left) - 49 minutes   DICTATION: .Other Dictation: Dictation Number unknown  PLAN OF CARE: Discharge to home after PACU  PATIENT DISPOSITION:  PACU - hemodynamically stable.   Delay start of Pharmacological VTE agent (>24hrs) due to surgical blood loss or risk of bleeding: yes

## 2021-12-08 NOTE — Op Note (Signed)
NAME: Krystal Gibson, VINING MEDICAL RECORD NO: [id number] ACCOUNT NO: [id number] DATE OF BIRTH: 1955/05/24 FACILITY: [facility] LOCATION: [facility] PHYSICIAN: W D. Valeta Harms., MD  Operative Report   DATE OF PROCEDURE: 12/08/2021  PREOPERATIVE DIAGNOSIS:  Severe osteoarthritis with flexion contracture and varus deformity.  POSTOPERATIVE DIAGNOSIS:  Severe osteoarthritis with flexion contracture and varus deformity.  PROCEDURE:  Left total knee replacement (Attune cemented knee, size 4 femur, tibia, 5 mm bearing with 38 mm all poly patella.  SURGEON: W D. Valeta Harms., MD  ASSISTANTMarjo Bicker.  ANESTHESIA:  Spinal with block.  TOURNIQUET TIME:  48 minutes.  DESCRIPTION OF PROCEDURE:  Supine position, exsanguination of leg, inflation of thigh tourniquet to 350.  Medial parapatellar approach to the knee was made.  We did a 5-degree valgus, 10 mm cut on the femur followed by cutting about 3 mm below the most  diseased medial compartment with an additional millimeter cut to obtain full extension.  Sized the femur and it sized to be a size 4.  Placement of the all-in-1 cutting block and 3 degrees of external rotation, accomplishing the anterior, posterior and  chamfer cuts. Complete release of the PCL.  Resection of posterior osteophytes as well.  We infiltrated the capsular subcutaneous tissues with a mixture of Marcaine and Exparel.  Tibia was sized to be a size 4.  Placement of the keel cut on the tibia,  followed by the trial tibia.  Box cut was made on the femur.  Trial femur placed.  Patella was cut, resecting 7.5 mm on the relatively thin patella.  The patient with the trials in had full extension, good balance of all the ligaments and good tracking  with all degrees of flexion.  Cement was prepared on the back table.  The patient Staph positive, smoker, prediabetic.  We elected to use antibiotic cement.  Components were inserted tibia followed by femur, patella with the trial.  Trial was removed   under direct vision.  A large chunk of cement was removed from the posterior aspect of the knee.  Tourniquet was released under direct vision before placement of the final bearing.  No excessive bleeding was noted.  Pulsatile irrigation was used as well  as topical TXA.  We then placed the final bearing.  Closure was affected with #1 Ethibond, 2-0 Vicryl and Monocryl in the skin.  Taken to recovery room in stable condition.       PAA D: 12/08/2021 9:13:36 am T: 12/08/2021 10:27:00 am  JOB: [id number]/ [id number]

## 2021-12-08 NOTE — Transfer of Care (Signed)
Immediate Anesthesia Transfer of Care Note  Patient: Krystal Gibson  Procedure(s) Performed: Procedure(s): TOTAL KNEE ARTHROPLASTY (Left)  Patient Location: PACU  Anesthesia Type:Spinal  Level of Consciousness: awake, alert  and oriented  Airway & Oxygen Therapy: Patient Spontanous Breathing  Post-op Assessment: Report given to RN and Post -op Vital signs reviewed and stable  Post vital signs: Reviewed and stable  Last Vitals:  Vitals:   12/08/21 0558 12/08/21 0956  BP: 124/81 116/68  Pulse: 66 68  Resp: 15 16  Temp: 36.6 C 36.6 C  SpO2: 72% 09%    Complications: No apparent anesthesia complications

## 2021-12-08 NOTE — Progress Notes (Signed)
Orthopedic Tech Progress Note Patient Details:  Krystal Gibson [birth year] [id number]  Ortho Devices Type of Ortho Device: Bone foam zero knee Ortho Device/Splint Location: LLE Ortho Device/Splint Interventions: Application   Post Interventions Patient Tolerated: Well  Krystal Gibson 12/08/2021, 10:36 AM

## 2021-12-11 ENCOUNTER — Encounter (HOSPITAL_COMMUNITY): Payer: Self-pay | Admitting: Orthopedic Surgery

## 2021-12-11 DIAGNOSIS — M25662 Stiffness of left knee, not elsewhere classified: Secondary | ICD-10-CM | POA: Diagnosis not present

## 2021-12-11 DIAGNOSIS — R262 Difficulty in walking, not elsewhere classified: Secondary | ICD-10-CM | POA: Diagnosis not present

## 2021-12-11 DIAGNOSIS — M6281 Muscle weakness (generalized): Secondary | ICD-10-CM | POA: Diagnosis not present

## 2021-12-11 DIAGNOSIS — M1712 Unilateral primary osteoarthritis, left knee: Secondary | ICD-10-CM | POA: Diagnosis not present

## 2021-12-15 DIAGNOSIS — R262 Difficulty in walking, not elsewhere classified: Secondary | ICD-10-CM | POA: Diagnosis not present

## 2021-12-15 DIAGNOSIS — M25662 Stiffness of left knee, not elsewhere classified: Secondary | ICD-10-CM | POA: Diagnosis not present

## 2021-12-15 DIAGNOSIS — M1712 Unilateral primary osteoarthritis, left knee: Secondary | ICD-10-CM | POA: Diagnosis not present

## 2021-12-15 DIAGNOSIS — M6281 Muscle weakness (generalized): Secondary | ICD-10-CM | POA: Diagnosis not present

## 2021-12-16 DIAGNOSIS — Z96652 Presence of left artificial knee joint: Secondary | ICD-10-CM | POA: Diagnosis not present

## 2021-12-16 DIAGNOSIS — M1712 Unilateral primary osteoarthritis, left knee: Secondary | ICD-10-CM | POA: Diagnosis not present

## 2021-12-18 DIAGNOSIS — M25662 Stiffness of left knee, not elsewhere classified: Secondary | ICD-10-CM | POA: Diagnosis not present

## 2021-12-18 DIAGNOSIS — R262 Difficulty in walking, not elsewhere classified: Secondary | ICD-10-CM | POA: Diagnosis not present

## 2021-12-18 DIAGNOSIS — M1712 Unilateral primary osteoarthritis, left knee: Secondary | ICD-10-CM | POA: Diagnosis not present

## 2021-12-18 DIAGNOSIS — M6281 Muscle weakness (generalized): Secondary | ICD-10-CM | POA: Diagnosis not present

## 2021-12-21 DIAGNOSIS — M1712 Unilateral primary osteoarthritis, left knee: Secondary | ICD-10-CM | POA: Diagnosis not present

## 2021-12-22 DIAGNOSIS — M1712 Unilateral primary osteoarthritis, left knee: Secondary | ICD-10-CM | POA: Diagnosis not present

## 2021-12-22 DIAGNOSIS — Z96652 Presence of left artificial knee joint: Secondary | ICD-10-CM | POA: Diagnosis not present

## 2021-12-26 DIAGNOSIS — M6281 Muscle weakness (generalized): Secondary | ICD-10-CM | POA: Diagnosis not present

## 2021-12-26 DIAGNOSIS — M1712 Unilateral primary osteoarthritis, left knee: Secondary | ICD-10-CM | POA: Diagnosis not present

## 2021-12-26 DIAGNOSIS — R262 Difficulty in walking, not elsewhere classified: Secondary | ICD-10-CM | POA: Diagnosis not present

## 2021-12-26 DIAGNOSIS — M25662 Stiffness of left knee, not elsewhere classified: Secondary | ICD-10-CM | POA: Diagnosis not present

## 2021-12-29 DIAGNOSIS — M6281 Muscle weakness (generalized): Secondary | ICD-10-CM | POA: Diagnosis not present

## 2021-12-29 DIAGNOSIS — M1712 Unilateral primary osteoarthritis, left knee: Secondary | ICD-10-CM | POA: Diagnosis not present

## 2021-12-29 DIAGNOSIS — M25662 Stiffness of left knee, not elsewhere classified: Secondary | ICD-10-CM | POA: Diagnosis not present

## 2021-12-29 DIAGNOSIS — R262 Difficulty in walking, not elsewhere classified: Secondary | ICD-10-CM | POA: Diagnosis not present

## 2022-01-04 DIAGNOSIS — M1712 Unilateral primary osteoarthritis, left knee: Secondary | ICD-10-CM | POA: Diagnosis not present

## 2022-01-04 DIAGNOSIS — R262 Difficulty in walking, not elsewhere classified: Secondary | ICD-10-CM | POA: Diagnosis not present

## 2022-01-04 DIAGNOSIS — M6281 Muscle weakness (generalized): Secondary | ICD-10-CM | POA: Diagnosis not present

## 2022-01-04 DIAGNOSIS — M25662 Stiffness of left knee, not elsewhere classified: Secondary | ICD-10-CM | POA: Diagnosis not present

## 2022-01-08 DIAGNOSIS — R262 Difficulty in walking, not elsewhere classified: Secondary | ICD-10-CM | POA: Diagnosis not present

## 2022-01-08 DIAGNOSIS — M25662 Stiffness of left knee, not elsewhere classified: Secondary | ICD-10-CM | POA: Diagnosis not present

## 2022-01-08 DIAGNOSIS — M6281 Muscle weakness (generalized): Secondary | ICD-10-CM | POA: Diagnosis not present

## 2022-01-08 DIAGNOSIS — M1712 Unilateral primary osteoarthritis, left knee: Secondary | ICD-10-CM | POA: Diagnosis not present

## 2022-01-15 DIAGNOSIS — M25662 Stiffness of left knee, not elsewhere classified: Secondary | ICD-10-CM | POA: Diagnosis not present

## 2022-01-15 DIAGNOSIS — M6281 Muscle weakness (generalized): Secondary | ICD-10-CM | POA: Diagnosis not present

## 2022-01-15 DIAGNOSIS — M1712 Unilateral primary osteoarthritis, left knee: Secondary | ICD-10-CM | POA: Diagnosis not present

## 2022-01-15 DIAGNOSIS — R262 Difficulty in walking, not elsewhere classified: Secondary | ICD-10-CM | POA: Diagnosis not present

## 2022-01-18 DIAGNOSIS — M1712 Unilateral primary osteoarthritis, left knee: Secondary | ICD-10-CM | POA: Diagnosis not present

## 2022-01-22 DIAGNOSIS — M25662 Stiffness of left knee, not elsewhere classified: Secondary | ICD-10-CM | POA: Diagnosis not present

## 2022-01-22 DIAGNOSIS — M6281 Muscle weakness (generalized): Secondary | ICD-10-CM | POA: Diagnosis not present

## 2022-01-22 DIAGNOSIS — M1712 Unilateral primary osteoarthritis, left knee: Secondary | ICD-10-CM | POA: Diagnosis not present

## 2022-01-22 DIAGNOSIS — R262 Difficulty in walking, not elsewhere classified: Secondary | ICD-10-CM | POA: Diagnosis not present

## 2022-01-29 DIAGNOSIS — I10 Essential (primary) hypertension: Secondary | ICD-10-CM | POA: Diagnosis not present

## 2022-01-29 DIAGNOSIS — R262 Difficulty in walking, not elsewhere classified: Secondary | ICD-10-CM | POA: Diagnosis not present

## 2022-01-29 DIAGNOSIS — M1712 Unilateral primary osteoarthritis, left knee: Secondary | ICD-10-CM | POA: Diagnosis not present

## 2022-01-29 DIAGNOSIS — E7800 Pure hypercholesterolemia, unspecified: Secondary | ICD-10-CM | POA: Diagnosis not present

## 2022-01-29 DIAGNOSIS — M6281 Muscle weakness (generalized): Secondary | ICD-10-CM | POA: Diagnosis not present

## 2022-01-29 DIAGNOSIS — M25662 Stiffness of left knee, not elsewhere classified: Secondary | ICD-10-CM | POA: Diagnosis not present

## 2022-01-29 DIAGNOSIS — R7303 Prediabetes: Secondary | ICD-10-CM | POA: Diagnosis not present

## 2022-02-05 DIAGNOSIS — Z0000 Encounter for general adult medical examination without abnormal findings: Secondary | ICD-10-CM | POA: Diagnosis not present

## 2022-02-05 DIAGNOSIS — E7800 Pure hypercholesterolemia, unspecified: Secondary | ICD-10-CM | POA: Diagnosis not present

## 2022-02-05 DIAGNOSIS — R7303 Prediabetes: Secondary | ICD-10-CM | POA: Diagnosis not present

## 2022-02-05 DIAGNOSIS — M159 Polyosteoarthritis, unspecified: Secondary | ICD-10-CM | POA: Diagnosis not present

## 2022-02-05 DIAGNOSIS — M542 Cervicalgia: Secondary | ICD-10-CM | POA: Diagnosis not present

## 2022-02-05 DIAGNOSIS — K219 Gastro-esophageal reflux disease without esophagitis: Secondary | ICD-10-CM | POA: Diagnosis not present

## 2022-02-05 DIAGNOSIS — M5412 Radiculopathy, cervical region: Secondary | ICD-10-CM | POA: Diagnosis not present

## 2022-02-05 DIAGNOSIS — I10 Essential (primary) hypertension: Secondary | ICD-10-CM | POA: Diagnosis not present

## 2022-02-12 DIAGNOSIS — M1712 Unilateral primary osteoarthritis, left knee: Secondary | ICD-10-CM | POA: Diagnosis not present

## 2022-02-12 DIAGNOSIS — R262 Difficulty in walking, not elsewhere classified: Secondary | ICD-10-CM | POA: Diagnosis not present

## 2022-02-12 DIAGNOSIS — M6281 Muscle weakness (generalized): Secondary | ICD-10-CM | POA: Diagnosis not present

## 2022-02-12 DIAGNOSIS — M25662 Stiffness of left knee, not elsewhere classified: Secondary | ICD-10-CM | POA: Diagnosis not present

## 2022-02-19 DIAGNOSIS — R262 Difficulty in walking, not elsewhere classified: Secondary | ICD-10-CM | POA: Diagnosis not present

## 2022-02-19 DIAGNOSIS — M6281 Muscle weakness (generalized): Secondary | ICD-10-CM | POA: Diagnosis not present

## 2022-02-19 DIAGNOSIS — M1712 Unilateral primary osteoarthritis, left knee: Secondary | ICD-10-CM | POA: Diagnosis not present

## 2022-02-19 DIAGNOSIS — M25662 Stiffness of left knee, not elsewhere classified: Secondary | ICD-10-CM | POA: Diagnosis not present

## 2022-03-02 ENCOUNTER — Telehealth: Payer: Self-pay | Admitting: Gastroenterology

## 2022-03-02 NOTE — Telephone Encounter (Signed)
Patient called states she would liketo speak with you. Requesting a call back on [phone]. Please call to advise.

## 2022-03-05 DIAGNOSIS — E782 Mixed hyperlipidemia: Secondary | ICD-10-CM | POA: Diagnosis not present

## 2022-03-05 NOTE — Telephone Encounter (Signed)
Patient called in stating that for 4 days she experienced mid-upper abdominal pain, nausea, and constipation, however it has resolved now. Bowel movements back to normal. She's currently on pantoprazole daily as prescribed. She has an OV scheduled with Dr. Fuller Plan on 04/30/21, however would like to know what other medications she could take if she has another "flare up" of abdominal pain prior to her appointment.

## 2022-03-05 NOTE — Telephone Encounter (Signed)
See 08/04/2020 office note. Glycopyrrolate 1 mg po bid prn abdominal pain, bloating. If this medication is not helping please let us know.

## 2022-03-05 NOTE — Telephone Encounter (Signed)
Spoke with patient regarding MD recommendations, no further questions.

## 2022-03-06 LAB — CMP14+EGFR
ALT: 15 IU/L (ref 0–32)
AST: 13 IU/L (ref 0–40)
Albumin/Globulin Ratio: 1.8 (ref 1.2–2.2)
Albumin: 4.5 g/dL (ref 3.9–4.9)
Alkaline Phosphatase: 167 IU/L — ABNORMAL HIGH (ref 44–121)
BUN/Creatinine Ratio: 18 (ref 12–28)
BUN: 13 mg/dL (ref 8–27)
Bilirubin Total: 0.7 mg/dL (ref 0.0–1.2)
CO2: 26 mmol/L (ref 20–29)
Calcium: 9.8 mg/dL (ref 8.7–10.3)
Chloride: 104 mmol/L (ref 96–106)
Creatinine, Ser: 0.74 mg/dL (ref 0.57–1.00)
Globulin, Total: 2.5 g/dL (ref 1.5–4.5)
Glucose: 97 mg/dL (ref 70–99)
Potassium: 4.1 mmol/L (ref 3.5–5.2)
Sodium: 144 mmol/L (ref 134–144)
Total Protein: 7 g/dL (ref 6.0–8.5)
eGFR: 88 mL/min/{1.73_m2} (ref >59)

## 2022-03-06 LAB — LIPID PANEL
Chol/HDL Ratio: 4.7 ratio — ABNORMAL HIGH (ref 0.0–4.4)
Cholesterol, Total: 193 mg/dL (ref 100–199)
HDL: 41 mg/dL (ref >39)
LDL Chol Calc (NIH): 137 mg/dL — ABNORMAL HIGH (ref 0–99)
Triglycerides: 84 mg/dL (ref 0–149)
VLDL Cholesterol Cal: 15 mg/dL (ref 5–40)

## 2022-03-12 ENCOUNTER — Encounter: Payer: Self-pay | Admitting: Cardiology

## 2022-03-12 ENCOUNTER — Ambulatory Visit: Payer: Medicare HMO | Admitting: Cardiology

## 2022-03-12 VITALS — BP 137/74 | HR 71 | Resp 16 | Ht 65.0 in | Wt 178.0 lb

## 2022-03-12 DIAGNOSIS — R931 Abnormal findings on diagnostic imaging of heart and coronary circulation: Secondary | ICD-10-CM | POA: Diagnosis not present

## 2022-03-12 DIAGNOSIS — E782 Mixed hyperlipidemia: Secondary | ICD-10-CM

## 2022-03-12 DIAGNOSIS — I10 Essential (primary) hypertension: Secondary | ICD-10-CM

## 2022-03-12 MED ORDER — ATORVASTATIN CALCIUM 40 MG PO TABS: 40.0000 mg | ORAL_TABLET | Freq: Every day | ORAL | 3 refills | Status: DC

## 2022-03-12 NOTE — Progress Notes (Signed)
 Follow up visit  Subjective:   Krystal Gibson, female    DOB: 05/03/1955, 66 y.o.   MRN: [id number]    Chief Complaint  Patient presents with   Hyperlipidemia   Hypertension   Follow-up    3 month     HPI  66-year-old African American female with hypertension, hyperlipidemia, family h/oCAD,  irritable bowel syndrome.  Patient has been taking Lipitor 20 mg tabs, 1-2 tabs a day. Reviewed recent test results with the patient, details below. Blood pressure is well controlled.    Current Outpatient Medications:    acetaminophen (TYLENOL) 500 MG tablet, Take 2 tablets (1,000 mg total) by mouth every 6 (six) hours as needed., Disp: 100 tablet, Rfl: 2   amLODipine (NORVASC) 5 MG tablet, Take 1 tablet (5 mg total) by mouth daily., Disp: 90 tablet, Rfl: 1   atorvastatin (LIPITOR) 20 MG tablet, Take 1 tablet (20 mg total) by mouth daily., Disp: 90 tablet, Rfl: 3   cholecalciferol (VITAMIN D) 1000 UNITS tablet, Take 1,000 Units by mouth daily., Disp: , Rfl:    ezetimibe (ZETIA) 10 MG tablet, Take 1 tablet (10 mg total) by mouth daily., Disp: 90 tablet, Rfl: 3   glycopyrrolate (ROBINUL) 1 MG tablet, Take 1 tablet (1 mg total) by mouth 2 (two) times daily. (Patient taking differently: Take 1 mg by mouth daily as needed (Stomach spam).), Disp: 180 tablet, Rfl: 3   lisinopril-hydrochlorothiazide (ZESTORETIC) 10-12.5 MG tablet, Take 1 tablet by mouth daily., Disp: 90 tablet, Rfl: 1   pantoprazole (PROTONIX) 40 MG tablet, Take 1 tablet (40 mg total) by mouth daily., Disp: 90 tablet, Rfl: 3    Cardiovascular studies:  EKG 03/12/2022: Sinus rhythm 73 bpm Occasional PAC   Poor R-wave progression Low voltage   CT cardiac scoring 03/2019: Calcium score: 159. LM: 0. LAD: 67. Cx: 37. RCA: 55  EKG 12/24/2018: Sinus rhythm 62 bpm. Possible old inferior infarct.   Exercise Treadmill Stress Test  05/09/2018:  Indication: CP   The patient exercised on Bruce protocol for 5:21 min.  Patient achieved  6.37 METS and reached HR 115  bpm, which is   73% of maximum age-predicted HR.  Stress test terminated due to  Leg discomfort and fatigue.   Exercise capacity was below average for age. HR Response to Exercise: Suboptimal due to low exercise capacity.  BP Response to Exercise: Normal resting BP- appropriate response. Chest Pain: none. Arrhythmias: none.   Resting EKG demonstrates Normal sinus rhythm. ST Changes: With peak exercise there was no ST-T changes of ischemia. However, this is submaximal stress test due to poor exercise tolerance.     Overall Impression: Inconclusive, submaximal stress test due to  low exercise capacity.  Recommendation: Consider alternate ischemia testing, if clincial sucpision of CAD is high.    Echocardiogram 04/11/2018: Left ventricle cavity is normal in size. Moderate concentric hypertrophy of the left ventricle. Normal global wall motion. Doppler evidence of grade I (impaired) diastolic dysfunction, normal LAP. Calculated EF 63%. No significant valvular abnormality. Inadequate TR jet to estimate PA systolic pressure. Normal right atrial pressure.   Labs: 03/05/2022: Glucose 97, BUN/Cr 13/0.74. EGFR 88. Na/K 141/4.1. Rest of the CMP normal H/H 14/44. MCV 88. Platelets 291 Chol 193, TG 84, HDL 41, LDL 137  08/18/2021: Glucose 91, BUN/Cr 14/0.6. EGFR 98. Na/K 146/4.6. Rest of the CMP normal H/H 14/42. MCV 86. Platelets 282 HbA1C 6.0% Chol 194, TG 105, HDL 42, LDL 133  04/08/2018: Glucose 97. BUN/Cr 14/0.72. eGFR   89. Na/K 143/3.6. AlKP 125 mildly elevated. Rest of the CMP normal.  Chol 262, TG 102, HDL 41, LDL 201 HbAC 5.8%     Review of Systems  Cardiovascular:  Negative for chest pain, dyspnea on exertion, leg swelling, palpitations and syncope.        Vitals:   03/12/22 1033  BP: 137/74  Pulse: 71  Resp: 16  SpO2: 95%     Objective:   Physical Exam Vitals and nursing note reviewed.  Constitutional:      General:  She is not in acute distress. Neck:     Vascular: No JVD.  Cardiovascular:     Rate and Rhythm: Normal rate and regular rhythm.     Heart sounds: Normal heart sounds. No murmur heard. Pulmonary:     Effort: Pulmonary effort is normal.     Breath sounds: Normal breath sounds. No wheezing or rales.  Musculoskeletal:     Right lower leg: No edema.     Left lower leg: No edema.         Assessment & Recommendations:   66 year old [name] female with hypertension, hyperlipidemia, elevated coronary calcium score   Hypertension: Fairly well controlled on lisinopril-HCTZ 10-12.5 mg daily and amlodipine 5 mg daily.  Mixed hyperlipidemia, elevated coronary calcium score: LDL 137 on intermittent use of 1-2 tabs of Lipitor 20 mg and Zetia 10 mg daily. She does not want to be on injectable medications at this time. Increased Lipitor to 40 mg daily, continue Zetia 10 mg daily. Repeat lipid panel in 3 months.    Nigel Mormon, MD Pager: [phone] Office: [phone]

## 2022-03-23 ENCOUNTER — Other Ambulatory Visit: Payer: Self-pay | Admitting: Gastroenterology

## 2022-03-29 ENCOUNTER — Telehealth: Payer: Self-pay

## 2022-03-29 NOTE — Telephone Encounter (Signed)
Please reduce it back to 20 mg daily.  Thanks MJP

## 2022-03-29 NOTE — Telephone Encounter (Signed)
Patient called and stated that she was recently asked to increase her her atorvastatin '20mg'$  to '40mg'$ , and is now having dizzy spells, night sweats and headaches, but denies SOB, CP.  MA's:  Call patient back at : [phone]

## 2022-03-30 NOTE — Telephone Encounter (Signed)
Patient has already reduced it on her own, and is feeling "way better".

## 2022-04-30 ENCOUNTER — Ambulatory Visit: Payer: Medicare HMO | Admitting: Gastroenterology

## 2022-05-21 DIAGNOSIS — H5203 Hypermetropia, bilateral: Secondary | ICD-10-CM | POA: Diagnosis not present

## 2022-06-11 ENCOUNTER — Ambulatory Visit: Payer: Medicare HMO | Admitting: Cardiology

## 2022-06-15 DIAGNOSIS — R931 Abnormal findings on diagnostic imaging of heart and coronary circulation: Secondary | ICD-10-CM | POA: Diagnosis not present

## 2022-06-15 DIAGNOSIS — E782 Mixed hyperlipidemia: Secondary | ICD-10-CM | POA: Diagnosis not present

## 2022-06-16 LAB — LIPID PANEL
Chol/HDL Ratio: 4.7 ratio — ABNORMAL HIGH (ref 0.0–4.4)
Cholesterol, Total: 204 mg/dL — ABNORMAL HIGH (ref 100–199)
HDL: 43 mg/dL (ref >39)
LDL Chol Calc (NIH): 144 mg/dL — ABNORMAL HIGH (ref 0–99)
Triglycerides: 94 mg/dL (ref 0–149)
VLDL Cholesterol Cal: 17 mg/dL (ref 5–40)

## 2022-06-25 ENCOUNTER — Ambulatory Visit: Payer: Medicare HMO | Admitting: Cardiology

## 2022-06-25 ENCOUNTER — Encounter: Payer: Self-pay | Admitting: Cardiology

## 2022-06-25 VITALS — BP 147/80 | HR 76 | Resp 16 | Ht 65.0 in | Wt 174.6 lb

## 2022-06-25 DIAGNOSIS — R931 Abnormal findings on diagnostic imaging of heart and coronary circulation: Secondary | ICD-10-CM

## 2022-06-25 DIAGNOSIS — I10 Essential (primary) hypertension: Secondary | ICD-10-CM

## 2022-06-25 DIAGNOSIS — E782 Mixed hyperlipidemia: Secondary | ICD-10-CM | POA: Diagnosis not present

## 2022-06-25 NOTE — Progress Notes (Signed)
Follow up visit  Subjective:   Krystal Gibson, female    DOB: August 16, 1955, 67 y.o.   MRN: [id number]    Chief Complaint  Patient presents with   Hyperlipidemia   Hypertension   Follow-up    3 months     HPI  67 year old African American female with hypertension, hyperlipidemia, family h/oCAD,  irritable bowel syndrome.  Patient has been taking Lipitor 20 mg bid, along with Zetia. Lipids remain elevated. She has also experienced multiple side effects since being on various statins, including myalgias, as well as hair loss. Blood pressure elevated today, but generally well controlled at home.     Current Outpatient Medications:    acetaminophen (TYLENOL) 500 MG tablet, Take 2 tablets (1,000 mg total) by mouth every 6 (six) hours as needed., Disp: 100 tablet, Rfl: 2   amLODipine (NORVASC) 5 MG tablet, Take 1 tablet (5 mg total) by mouth daily., Disp: 90 tablet, Rfl: 1   atorvastatin (LIPITOR) 40 MG tablet, Take 1 tablet (40 mg total) by mouth daily., Disp: 90 tablet, Rfl: 3   cholecalciferol (VITAMIN D) 1000 UNITS tablet, Take 1,000 Units by mouth daily., Disp: , Rfl:    ezetimibe (ZETIA) 10 MG tablet, Take 1 tablet (10 mg total) by mouth daily., Disp: 90 tablet, Rfl: 3   glycopyrrolate (ROBINUL) 1 MG tablet, TAKE 1 TABLET BY MOUTH TWICE A DAY, Disp: 180 tablet, Rfl: 0   lisinopril-hydrochlorothiazide (ZESTORETIC) 10-12.5 MG tablet, Take 1 tablet by mouth daily., Disp: 90 tablet, Rfl: 1   pantoprazole (PROTONIX) 40 MG tablet, Take 1 tablet (40 mg total) by mouth daily., Disp: 90 tablet, Rfl: 3    Cardiovascular studies:  EKG 03/12/2022: Sinus rhythm 73 bpm Occasional PAC   Poor R-wave progression Low voltage   CT cardiac scoring 03/2019: Calcium score: 159. LM: 0. LAD: 67. Cx: 37. RCA: 50  Exercise Treadmill Stress Test  05/09/2018:  6.3 METS.  73% of maximum age-predicted HR.  Stress test terminated due to  leg discomfort and fatigue. With peak exercise there  was no ST-T changes of ischemia. However, this is submaximal stress test due to poor exercise tolerance.   Echocardiogram 04/11/2018: Left ventricle cavity is normal in size. Moderate concentric hypertrophy of the left ventricle. Normal global wall motion. Doppler evidence of grade I (impaired) diastolic dysfunction, normal LAP. Calculated EF 63%. No significant valvular abnormality. Inadequate TR jet to estimate PA systolic pressure. Normal right atrial pressure.   Labs: 06/15/2022: Chol 204, TG 94, HDL 43, LDL 144  03/05/2022: Glucose 97, BUN/Cr 13/0.74. EGFR 88. Na/K 141/4.1. Rest of the CMP normal H/H 14/44. MCV 88. Platelets 291 Chol 193, TG 84, HDL 41, LDL 137   Review of Systems  Cardiovascular:  Negative for chest pain, dyspnea on exertion, leg swelling, palpitations and syncope.        Vitals:   06/25/22 1042  BP: (!) 147/80  Pulse: 76  Resp: 16  SpO2: 96%     Objective:   Physical Exam Vitals and nursing note reviewed.  Constitutional:      General: She is not in acute distress. Neck:     Vascular: No JVD.  Cardiovascular:     Rate and Rhythm: Normal rate and regular rhythm.     Heart sounds: Normal heart sounds. No murmur heard. Pulmonary:     Effort: Pulmonary effort is normal.     Breath sounds: Normal breath sounds. No wheezing or rales.  Musculoskeletal:     Right lower  leg: No edema.     Left lower leg: No edema.         Assessment & Recommendations:   67 year old [name] female with hypertension, hyperlipidemia, elevated coronary calcium score   Hypertension: Generally well controlled at home. Recommend regular home checks. Patient will send me a message on MyChart in 4 weeks. If SBP consistently >140 mmHg, will make additional change. For now, continue lisinopril-HCTZ 10-12.5 mg daily and amlodipine 5 mg daily.  Mixed hyperlipidemia, elevated coronary calcium score: Lipids not controlled on statin plus ezetimibe, also has several  side effects including myalgias and hair loss. We discussed injectable options, and she is finally open to trying it.  Started Leqvio once every 6 months. Repeat lipid panel in 6 months.  F/u in 6 months   Nigel Mormon, MD Pager: [phone] Office: [phone]

## 2022-07-06 ENCOUNTER — Other Ambulatory Visit: Payer: Self-pay

## 2022-07-06 ENCOUNTER — Telehealth: Payer: Self-pay | Admitting: Pharmacy Technician

## 2022-07-06 NOTE — Telephone Encounter (Addendum)
Dr. Rosemary Holms, [place] [facility] note:  Auth Submission: APPROVED Site of care: Site of care: [facility] Payer: [employer] Medication & CPT/J Code(s) submitted: Leqvio (Inclisiran) J1306 Route of submission (phone, fax, portal):  Phone # Fax # Auth type: Buy/Bill Units/visits requested: 3 Reference number: [id number] Approval from: 07/03/22 to 01/08/23   Patient has been approved for NPAF PHONE: [phone] DATE: 07/28/22 - 04/23/23 ID:

## 2022-07-10 DIAGNOSIS — M1712 Unilateral primary osteoarthritis, left knee: Secondary | ICD-10-CM | POA: Diagnosis not present

## 2022-07-12 ENCOUNTER — Other Ambulatory Visit: Payer: Self-pay | Admitting: Gastroenterology

## 2022-07-16 ENCOUNTER — Encounter: Payer: Self-pay | Admitting: Cardiology

## 2022-07-16 NOTE — Telephone Encounter (Signed)
From patient.

## 2022-07-28 DIAGNOSIS — E782 Mixed hyperlipidemia: Secondary | ICD-10-CM | POA: Diagnosis not present

## 2022-08-09 ENCOUNTER — Other Ambulatory Visit: Payer: Self-pay

## 2022-08-09 DIAGNOSIS — E782 Mixed hyperlipidemia: Secondary | ICD-10-CM

## 2022-08-09 MED ORDER — LEQVIO 284 MG/1.5ML ~~LOC~~ SOSY: 284.0000 mg | PREFILLED_SYRINGE | Freq: Once | SUBCUTANEOUS | 2 refills | Status: DC

## 2022-08-09 MED ORDER — LEQVIO 284 MG/1.5ML ~~LOC~~ SOSY
284.0000 mg | PREFILLED_SYRINGE | Freq: Once | SUBCUTANEOUS | 2 refills | Status: AC
Start: 2022-08-09 — End: 2022-08-09

## 2022-08-09 NOTE — Telephone Encounter (Signed)
Rx for [employer] Patient [employer]

## 2022-08-13 ENCOUNTER — Ambulatory Visit: Payer: Medicare HMO | Admitting: Gastroenterology

## 2022-08-13 ENCOUNTER — Telehealth: Payer: Self-pay | Admitting: Gastroenterology

## 2022-08-13 ENCOUNTER — Telehealth: Payer: Self-pay

## 2022-08-13 ENCOUNTER — Encounter: Payer: Self-pay | Admitting: Gastroenterology

## 2022-08-13 VITALS — BP 126/78 | HR 70 | Ht 65.0 in | Wt 174.0 lb

## 2022-08-13 DIAGNOSIS — Z8719 Personal history of other diseases of the digestive system: Secondary | ICD-10-CM

## 2022-08-13 DIAGNOSIS — R748 Abnormal levels of other serum enzymes: Secondary | ICD-10-CM | POA: Diagnosis not present

## 2022-08-13 DIAGNOSIS — K580 Irritable bowel syndrome with diarrhea: Secondary | ICD-10-CM | POA: Diagnosis not present

## 2022-08-13 DIAGNOSIS — K219 Gastro-esophageal reflux disease without esophagitis: Secondary | ICD-10-CM | POA: Diagnosis not present

## 2022-08-13 MED ORDER — PANTOPRAZOLE SODIUM 40 MG PO TBEC
40.0000 mg | DELAYED_RELEASE_TABLET | Freq: Every day | ORAL | 3 refills | Status: AC
Start: 2022-08-13 — End: ?

## 2022-08-13 MED ORDER — DICYCLOMINE HCL 10 MG PO CAPS: 10.0000 mg | ORAL_CAPSULE | Freq: Three times a day (TID) | ORAL | 11 refills | Status: AC

## 2022-08-13 NOTE — Patient Instructions (Signed)
We have sent the following medications to your pharmacy for you to pick up at your convenience: dicyclomine and pantoprazole.  Stop glycopyrrolate.   Please follow the low-fodmap diet.   Call or Mychart message [place] in 3 weeks if your symptoms are not better.   The [place] [facility] providers would like to encourage you to use [facility] to communicate with providers for non-urgent requests or questions.  Due to long hold times on the telephone, sending your provider a message by [facility] may be a faster and more efficient way to get a response.  Please allow 48 business hours for a response.  Please remember that this is for non-urgent requests.   Thank you for choosing me and [facility].  Krystal Gibson. Pleas Koch., MD., Clementeen Graham

## 2022-08-13 NOTE — Telephone Encounter (Signed)
PA request received via CMM for Dicyclomine HCl  capsules  PA has been submitted to [facility] and has been APPROVED from 08/13/2022-04/23/2023  Key: [id number]

## 2022-08-13 NOTE — Progress Notes (Signed)
    Assessment     IBS-D GERD with history of esophageal stricture, medium-sized HH Elevated alkaline phosphatase History of erosive gastritis Severe diverticulosis   Recommendations    Begin dicyclomine 10 mg ac & hs, discontinue glycopyrrolate, review low FODMAP diet, contact us if symptoms are not adequately controlled Continue pantoprazole 40 mg daily, follow antireflux measures If dysphagia worsens contact us to consider repeat EGD with dilation Repeat LFTs Screening colonoscopy due in January 2029 REV in 1 year   HPI    This is a 67 year old female with IBS-D who notes worsening and postprandial urgent diarrhea.  Her brother recently passed away unexpectedly at age 69.  She notes worsening diarrhea since then.  She has been maintained on glycopyrrolate 1 mg twice daily.  She avoids certain foods that exacerbate her symptoms however almost anything she eats will cause fairly urgent postprandial diarrhea at this time.  Her reflux symptoms are well-controlled.  She notes rare episodes of transient solid food dysphagia.  Alkaline phosphatase was elevated in November 2023 at 167.   Labs / Imaging       Latest Ref Rng & Units 03/05/2022    7:57 AM 11/30/2021   11:30 AM 07/03/2019   12:11 PM  Hepatic Function  Total Protein 6.0 - 8.5 g/dL 7.0  7.7  7.8   Albumin 3.9 - 4.9 g/dL 4.5  4.4  4.4   AST 0 - 40 IU/L ALT 0 - 32 IU/L Alk Phosphatase 44 - 121 IU/L 167  115  98   Total Bilirubin 0.0 - 1.2 mg/dL 0.7  1.7  1.1   Bilirubin, Direct 0.0 - 0.3 mg/dL   0.2        Latest Ref Rng & Units 11/30/2021   11:30 AM 07/30/2013    2:17 PM 06/19/2011    5:40 PM  CBC  WBC 4.0 - 10.5 K/uL 9.8  7.0  14.4   Hemoglobin 12.0 - 15.0 g/dL 62.1  30.8  65.7   Hematocrit 36.0 - 46.0 % 44.7  41.3  39.5   Platelets 150 - 400 K/uL 291  300  339     Current Medications, Allergies, Past Medical History, Past Surgical History, Family History and Social History were  reviewed in [employer] record.   Physical Exam: General: Well developed, well nourished, no acute distress Head: Normocephalic and atraumatic Eyes: Sclerae anicteric, EOMI Ears: Normal auditory acuity Mouth: No deformities or lesions noted Lungs: Clear throughout to auscultation Heart: Regular rate and rhythm; No murmurs, rubs or bruits Abdomen: Soft, non tender and non distended. No masses, hepatosplenomegaly or hernias noted. Normal Bowel sounds Rectal: Not done Musculoskeletal: Symmetrical with no gross deformities  Pulses:  Normal pulses noted Extremities: No edema or deformities noted Neurological: Alert oriented x 4, grossly nonfocal Psychological:  Alert and cooperative. Normal mood and affect   Brondon Wann T. Russella Dar, MD 08/13/2022, 9:38 AM

## 2022-08-13 NOTE — Telephone Encounter (Signed)
Inbound call from patient, states pharmacy is requesting a prior authorization for Dicyclomine medication.

## 2022-08-14 NOTE — Telephone Encounter (Signed)
PA has been approved

## 2022-08-20 DIAGNOSIS — M542 Cervicalgia: Secondary | ICD-10-CM | POA: Diagnosis not present

## 2022-08-20 DIAGNOSIS — M5412 Radiculopathy, cervical region: Secondary | ICD-10-CM | POA: Diagnosis not present

## 2022-08-22 ENCOUNTER — Encounter: Payer: Self-pay | Admitting: Cardiology

## 2022-08-27 DIAGNOSIS — E782 Mixed hyperlipidemia: Secondary | ICD-10-CM | POA: Diagnosis not present

## 2022-09-03 ENCOUNTER — Ambulatory Visit (INDEPENDENT_AMBULATORY_CARE_PROVIDER_SITE_OTHER): Payer: Medicare HMO

## 2022-09-03 VITALS — BP 125/76 | HR 73 | Temp 98.1°F | Resp 16 | Ht 65.0 in | Wt 175.6 lb

## 2022-09-03 DIAGNOSIS — E782 Mixed hyperlipidemia: Secondary | ICD-10-CM | POA: Diagnosis not present

## 2022-09-03 MED ORDER — INCLISIRAN SODIUM 284 MG/1.5ML ~~LOC~~ SOSY
284.0000 mg | PREFILLED_SYRINGE | Freq: Once | SUBCUTANEOUS | Status: AC
  Administered 2022-09-03: 284 mg via SUBCUTANEOUS

## 2022-09-03 NOTE — Progress Notes (Signed)
Diagnosis: Hyperlipidemia  Provider:  Chilton Greathouse MD  Procedure: Injection  Leqvio (inclisiran), Dose: 284 mg, Site: subcutaneous, Number of injections: 1  Post Care: Observation period completed  Discharge: Condition: Good, Destination: Home . AVS Provided  Performed by:  Loney Hering, LPN

## 2022-09-05 ENCOUNTER — Telehealth: Payer: Self-pay

## 2022-09-05 NOTE — Telephone Encounter (Signed)
Patient called and said since getting leqvio injection Monday 09/03/2022, she has had a terrible headache and tylenol isnt helping, is there anything else she can take?  [phone]

## 2022-09-05 NOTE — Telephone Encounter (Signed)
Ok to take Ibuprofen for a day or two. If symptoms still do not improve, may need furhter assessment.  Thanks MJP

## 2022-09-08 DIAGNOSIS — K589 Irritable bowel syndrome without diarrhea: Secondary | ICD-10-CM | POA: Diagnosis not present

## 2022-09-08 DIAGNOSIS — I10 Essential (primary) hypertension: Secondary | ICD-10-CM | POA: Diagnosis not present

## 2022-09-08 DIAGNOSIS — F17210 Nicotine dependence, cigarettes, uncomplicated: Secondary | ICD-10-CM | POA: Diagnosis not present

## 2022-09-08 DIAGNOSIS — E669 Obesity, unspecified: Secondary | ICD-10-CM | POA: Diagnosis not present

## 2022-09-08 DIAGNOSIS — Z6830 Body mass index (BMI) 30.0-30.9, adult: Secondary | ICD-10-CM | POA: Diagnosis not present

## 2022-09-08 DIAGNOSIS — Z9181 History of falling: Secondary | ICD-10-CM | POA: Diagnosis not present

## 2022-09-08 DIAGNOSIS — E1159 Type 2 diabetes mellitus with other circulatory complications: Secondary | ICD-10-CM | POA: Diagnosis not present

## 2022-09-08 DIAGNOSIS — M1990 Unspecified osteoarthritis, unspecified site: Secondary | ICD-10-CM | POA: Diagnosis not present

## 2022-09-08 DIAGNOSIS — E1142 Type 2 diabetes mellitus with diabetic polyneuropathy: Secondary | ICD-10-CM | POA: Diagnosis not present

## 2022-09-08 DIAGNOSIS — E785 Hyperlipidemia, unspecified: Secondary | ICD-10-CM | POA: Diagnosis not present

## 2022-09-08 DIAGNOSIS — Z008 Encounter for other general examination: Secondary | ICD-10-CM | POA: Diagnosis not present

## 2022-09-08 DIAGNOSIS — K219 Gastro-esophageal reflux disease without esophagitis: Secondary | ICD-10-CM | POA: Diagnosis not present

## 2022-09-08 DIAGNOSIS — M5412 Radiculopathy, cervical region: Secondary | ICD-10-CM | POA: Diagnosis not present

## 2022-09-28 DIAGNOSIS — U071 COVID-19: Secondary | ICD-10-CM | POA: Diagnosis not present

## 2022-09-28 DIAGNOSIS — J069 Acute upper respiratory infection, unspecified: Secondary | ICD-10-CM | POA: Diagnosis not present

## 2022-09-28 DIAGNOSIS — Z0000 Encounter for general adult medical examination without abnormal findings: Secondary | ICD-10-CM | POA: Diagnosis not present

## 2022-10-01 ENCOUNTER — Ambulatory Visit: Payer: Medicare HMO | Admitting: Cardiology

## 2022-10-08 DIAGNOSIS — U071 COVID-19: Secondary | ICD-10-CM | POA: Diagnosis not present

## 2022-10-08 DIAGNOSIS — Z20822 Contact with and (suspected) exposure to covid-19: Secondary | ICD-10-CM | POA: Diagnosis not present

## 2022-10-16 ENCOUNTER — Other Ambulatory Visit: Payer: Self-pay | Admitting: Family Medicine

## 2022-10-16 DIAGNOSIS — Z1231 Encounter for screening mammogram for malignant neoplasm of breast: Secondary | ICD-10-CM

## 2022-10-23 DIAGNOSIS — E782 Mixed hyperlipidemia: Secondary | ICD-10-CM | POA: Diagnosis not present

## 2022-10-24 LAB — LIPID PANEL
Chol/HDL Ratio: 3.6 ratio (ref 0.0–4.4)
Cholesterol, Total: 186 mg/dL (ref 100–199)
HDL: 51 mg/dL (ref >39)
LDL Chol Calc (NIH): 110 mg/dL — ABNORMAL HIGH (ref 0–99)
Triglycerides: 143 mg/dL (ref 0–149)
VLDL Cholesterol Cal: 25 mg/dL (ref 5–40)

## 2022-10-28 NOTE — Progress Notes (Signed)
Follow up visit  Subjective:   Krystal Gibson, female    DOB: 12/01/1955, 67 y.o.   MRN: [id number]    Chief Complaint  Patient presents with   Hyperlipidemia   Follow-up     HPI  67 year old African American female with hypertension, hyperlipidemia, family h/oCAD,  irritable bowel syndrome.  Patient has been taking Lipitor 20 mg bid, along with Zetia. Lipids remain elevated. She has also experienced multiple side effects since being on various statins, including myalgias, as well as hair loss. Blood pressure elevated today, but generally well controlled at home.     Current Outpatient Medications:    amLODipine (NORVASC) 5 MG tablet, Take 1 tablet (5 mg total) by mouth daily., Disp: 90 tablet, Rfl: 1   cholecalciferol (VITAMIN D) 1000 UNITS tablet, Take 1,000 Units by mouth daily., Disp: , Rfl:    dicyclomine (BENTYL) 10 MG capsule, Take 1 capsule (10 mg total) by mouth 3 (three) times daily before meals., Disp: 90 capsule, Rfl: 11   inclisiran (LEQVIO) 284 MG/1.5ML SOSY injection, Inject 284 mg into the skin once., Disp: , Rfl:    lisinopril-hydrochlorothiazide (ZESTORETIC) 10-12.5 MG tablet, Take 1 tablet by mouth daily., Disp: 90 tablet, Rfl: 1   nicotine polacrilex (NICORETTE) 2 MG gum, Take 1 each (2 mg total) by mouth as needed for smoking cessation., Disp: 100 tablet, Rfl: 1   pantoprazole (PROTONIX) 40 MG tablet, Take 1 tablet (40 mg total) by mouth daily., Disp: 90 tablet, Rfl: 3   pregabalin (LYRICA) 100 MG capsule, Take 100 mg by mouth 2 (two) times daily., Disp: , Rfl:     Cardiovascular studies:  EKG 03/12/2022: Sinus rhythm 73 bpm Occasional PAC   Poor R-wave progression Low voltage   CT cardiac scoring 03/2019: Calcium score: 159. LM: 0. LAD: 67. Cx: 37. RCA: 72  Exercise Treadmill Stress Test  05/09/2018:  6.3 METS.  73% of maximum age-predicted HR.  Stress test terminated due to  leg discomfort and fatigue. With peak exercise there was no  ST-T changes of ischemia. However, this is submaximal stress test due to poor exercise tolerance.   Echocardiogram 04/11/2018: Left ventricle cavity is normal in size. Moderate concentric hypertrophy of the left ventricle. Normal global wall motion. Doppler evidence of grade I (impaired) diastolic dysfunction, normal LAP. Calculated EF 63%. No significant valvular abnormality. Inadequate TR jet to estimate PA systolic pressure. Normal right atrial pressure.   Labs: 10/23/2022: Chol 186, TG 143, HDL 51, LDL 110  06/15/2022: Chol 204, TG 94, HDL 43, LDL 144  03/05/2022: Glucose 97, BUN/Cr 13/0.74. EGFR 88. Na/K 141/4.1. Rest of the CMP normal H/H 14/44. MCV 88. Platelets 291 Chol 193, TG 84, HDL 41, LDL 137   Review of Systems  Cardiovascular:  Negative for chest pain, dyspnea on exertion, leg swelling, palpitations and syncope.        Vitals:   10/29/22 0838  BP: (!) 155/88  Pulse: 64  SpO2: 96%     Objective:   Physical Exam Vitals and nursing note reviewed.  Constitutional:      General: She is not in acute distress. Neck:     Vascular: No JVD.  Cardiovascular:     Rate and Rhythm: Normal rate and regular rhythm.     Heart sounds: Normal heart sounds. No murmur heard. Pulmonary:     Effort: Pulmonary effort is normal.     Breath sounds: Normal breath sounds. No wheezing or rales.  Musculoskeletal:  Right lower leg: No edema.     Left lower leg: No edema.         Assessment & Recommendations:   67 year old [place] American female with hypertension, hyperlipidemia, elevated coronary calcium score   Hypertension: Component of white coat hypertension, generally well controlled at home. No change made today.  Mixed hyperlipidemia, elevated coronary calcium score: Chol 186, TG 143, HDL 51, LDL 110 (10/2022). Currently on Leqvio, has only received one injection so far.  Continue the same, Repeat lipid panel in 03/2023.  F/u in 6 months   Elder Negus, MD Pager: [phone] Office: [phone]

## 2022-10-29 ENCOUNTER — Ambulatory Visit: Payer: Medicare HMO | Admitting: Cardiology

## 2022-10-29 ENCOUNTER — Encounter: Payer: Self-pay | Admitting: Cardiology

## 2022-10-29 VITALS — BP 136/83 | HR 58 | Ht 65.0 in | Wt 174.0 lb

## 2022-10-29 DIAGNOSIS — E782 Mixed hyperlipidemia: Secondary | ICD-10-CM | POA: Diagnosis not present

## 2022-10-29 DIAGNOSIS — I10 Essential (primary) hypertension: Secondary | ICD-10-CM

## 2022-10-29 DIAGNOSIS — R931 Abnormal findings on diagnostic imaging of heart and coronary circulation: Secondary | ICD-10-CM | POA: Diagnosis not present

## 2022-10-29 MED ORDER — NICOTINE POLACRILEX 2 MG MT GUM: 2.0000 mg | CHEWING_GUM | OROMUCOSAL | 1 refills | Status: DC | PRN

## 2022-11-05 DIAGNOSIS — H43812 Vitreous degeneration, left eye: Secondary | ICD-10-CM | POA: Diagnosis not present

## 2022-11-05 DIAGNOSIS — H35362 Drusen (degenerative) of macula, left eye: Secondary | ICD-10-CM | POA: Diagnosis not present

## 2022-11-05 DIAGNOSIS — H2513 Age-related nuclear cataract, bilateral: Secondary | ICD-10-CM | POA: Diagnosis not present

## 2022-11-11 ENCOUNTER — Other Ambulatory Visit: Payer: Self-pay | Admitting: Cardiology

## 2022-11-11 DIAGNOSIS — E782 Mixed hyperlipidemia: Secondary | ICD-10-CM

## 2022-11-14 ENCOUNTER — Telehealth: Payer: Self-pay | Admitting: Gastroenterology

## 2022-11-14 NOTE — Telephone Encounter (Signed)
Patient called states she is concerned because for 4 day [place] now when she has a bowel movement she is feeling a sharp pain like under her chest area. Seeking further advise.

## 2022-11-15 NOTE — Telephone Encounter (Signed)
Line busy

## 2022-11-16 NOTE — Telephone Encounter (Signed)
The pt states that she had some upper abd cramping about 2 days ago.  She took Bentyl and protonix and the symptoms resolved. She will call back if her symptoms return.

## 2022-11-19 ENCOUNTER — Ambulatory Visit
Admission: RE | Admit: 2022-11-19 | Discharge: 2022-11-19 | Disposition: A | Discharge status: Home / Self Care | Payer: Medicare HMO | Source: Ambulatory Visit | Attending: Family Medicine | Admitting: Family Medicine

## 2022-11-19 DIAGNOSIS — Z1231 Encounter for screening mammogram for malignant neoplasm of breast: Secondary | ICD-10-CM | POA: Diagnosis not present

## 2022-11-26 DIAGNOSIS — E7800 Pure hypercholesterolemia, unspecified: Secondary | ICD-10-CM | POA: Diagnosis not present

## 2022-11-26 DIAGNOSIS — M159 Polyosteoarthritis, unspecified: Secondary | ICD-10-CM | POA: Diagnosis not present

## 2022-11-26 DIAGNOSIS — I10 Essential (primary) hypertension: Secondary | ICD-10-CM | POA: Diagnosis not present

## 2022-11-26 DIAGNOSIS — K219 Gastro-esophageal reflux disease without esophagitis: Secondary | ICD-10-CM | POA: Diagnosis not present

## 2022-11-26 DIAGNOSIS — R7303 Prediabetes: Secondary | ICD-10-CM | POA: Diagnosis not present

## 2022-11-26 DIAGNOSIS — M542 Cervicalgia: Secondary | ICD-10-CM | POA: Diagnosis not present

## 2022-12-10 ENCOUNTER — Ambulatory Visit (INDEPENDENT_AMBULATORY_CARE_PROVIDER_SITE_OTHER): Payer: Medicare HMO

## 2022-12-10 VITALS — BP 129/80 | HR 71 | Temp 97.5°F | Resp 16 | Ht 65.0 in | Wt 172.6 lb

## 2022-12-10 DIAGNOSIS — E782 Mixed hyperlipidemia: Secondary | ICD-10-CM | POA: Diagnosis not present

## 2022-12-10 MED ORDER — INCLISIRAN SODIUM 284 MG/1.5ML ~~LOC~~ SOSY
284.0000 mg | PREFILLED_SYRINGE | Freq: Once | SUBCUTANEOUS | Status: AC
  Administered 2022-12-10: 284 mg via SUBCUTANEOUS
  Filled 2022-12-10: qty 1.5

## 2022-12-10 NOTE — Progress Notes (Signed)
Diagnosis: Hyperlipidemia  Provider:  Chilton Greathouse MD  Procedure: Injection  Leqvio (inclisiran), Dose: 284 mg, Site: subcutaneous, Number of injections: 1  Post Care: Patient declined observation. Right upper abdomen  Discharge: Condition: Good, Destination: Home . AVS Declined  Performed by:  Rico Ala, LPN

## 2022-12-17 DIAGNOSIS — H43812 Vitreous degeneration, left eye: Secondary | ICD-10-CM | POA: Diagnosis not present

## 2023-01-07 ENCOUNTER — Telehealth: Payer: Self-pay | Admitting: Pharmacy Technician

## 2023-01-07 NOTE — Telephone Encounter (Signed)
Auth Submission: APPROVED Site of care: Site of care: [facility] Payer: [employer] Medication & CPT/J Code(s) submitted: Leqvio (Inclisiran) [phone] Route of submission (phone, fax, portal): portal Phone # Fax # Auth type: Buy/Bill PB Units/visits requested: x2 Reference number: [id number] Approval from: 01/04/23 to 01/04/24    Patient receives NPAF (Free drug)

## 2023-01-08 ENCOUNTER — Other Ambulatory Visit: Payer: Self-pay | Admitting: Pharmacy Technician

## 2023-01-12 ENCOUNTER — Other Ambulatory Visit: Payer: Self-pay | Admitting: Cardiology

## 2023-01-12 DIAGNOSIS — E782 Mixed hyperlipidemia: Secondary | ICD-10-CM

## 2023-02-09 ENCOUNTER — Other Ambulatory Visit: Payer: Self-pay | Admitting: Cardiology

## 2023-02-09 DIAGNOSIS — E782 Mixed hyperlipidemia: Secondary | ICD-10-CM

## 2023-02-11 DIAGNOSIS — Z23 Encounter for immunization: Secondary | ICD-10-CM | POA: Diagnosis not present

## 2023-02-11 DIAGNOSIS — M159 Polyosteoarthritis, unspecified: Secondary | ICD-10-CM | POA: Diagnosis not present

## 2023-02-11 DIAGNOSIS — M542 Cervicalgia: Secondary | ICD-10-CM | POA: Diagnosis not present

## 2023-02-11 DIAGNOSIS — Z0000 Encounter for general adult medical examination without abnormal findings: Secondary | ICD-10-CM | POA: Diagnosis not present

## 2023-02-11 DIAGNOSIS — K219 Gastro-esophageal reflux disease without esophagitis: Secondary | ICD-10-CM | POA: Diagnosis not present

## 2023-02-11 DIAGNOSIS — R7303 Prediabetes: Secondary | ICD-10-CM | POA: Diagnosis not present

## 2023-02-11 DIAGNOSIS — E7800 Pure hypercholesterolemia, unspecified: Secondary | ICD-10-CM | POA: Diagnosis not present

## 2023-02-11 DIAGNOSIS — I10 Essential (primary) hypertension: Secondary | ICD-10-CM | POA: Diagnosis not present

## 2023-02-14 DIAGNOSIS — M7552 Bursitis of left shoulder: Secondary | ICD-10-CM | POA: Diagnosis not present

## 2023-02-14 DIAGNOSIS — M542 Cervicalgia: Secondary | ICD-10-CM | POA: Diagnosis not present

## 2023-02-14 DIAGNOSIS — M5412 Radiculopathy, cervical region: Secondary | ICD-10-CM | POA: Diagnosis not present

## 2023-03-15 ENCOUNTER — Telehealth: Payer: Self-pay | Admitting: Cardiology

## 2023-03-15 NOTE — Telephone Encounter (Signed)
Patient assistance forms signed by Dr. Rosemary Holms and faxed back to Hessie Dibble at [phone].                    -------Fax Transmission Report-------  To:               Recipient at [phone] Subject:          Fw: Send data from [id number] 03/15/2023 11:50 Result:           The transmission was successful. Explanation:      All Pages Ok Pages Sent:       5 Connect Time:     4 minutes, 46 seconds Transmit Time:    03/15/2023 11:33 Transfer Rate:    14400 Status Code:      0000 Retry Count:      0 Job Id:           [id number] Unique Id:        [id number] Fax Line:         12 Fax Server:       [id number]

## 2023-03-15 NOTE — Telephone Encounter (Signed)
Calling in regards to pt's re enrollment for medication Leqvio assistance that is about to expire. She is faxing paperwork as we speak. Needs signed by provider and sent back ASAP. Please advise

## 2023-04-02 ENCOUNTER — Telehealth: Payer: Self-pay | Admitting: Cardiology

## 2023-04-02 NOTE — Telephone Encounter (Signed)
Pt c/o medication issue:  1. Name of Medication:   inclisiran (LEQVIO) 284 MG/1.5ML SOSY injection    2. How are you currently taking this medication (dosage and times per day)? Inject 284 mg into the skin once.   3. Are you having a reaction (difficulty breathing--STAT)? No  4. What is your medication issue? Pharmacy would like a c/b regarding scheduling drop off of medication. Please advise

## 2023-04-29 ENCOUNTER — Ambulatory Visit: Payer: Self-pay | Admitting: Cardiology

## 2023-05-17 DIAGNOSIS — E782 Mixed hyperlipidemia: Secondary | ICD-10-CM | POA: Diagnosis not present

## 2023-05-18 LAB — LIPID PANEL
Chol/HDL Ratio: 4.3 {ratio} (ref 0.0–4.4)
Cholesterol, Total: 219 mg/dL — ABNORMAL HIGH (ref 100–199)
HDL: 51 mg/dL (ref >39)
LDL Chol Calc (NIH): 149 mg/dL — ABNORMAL HIGH (ref 0–99)
Triglycerides: 108 mg/dL (ref 0–149)
VLDL Cholesterol Cal: 19 mg/dL (ref 5–40)

## 2023-05-20 ENCOUNTER — Encounter: Payer: Self-pay | Admitting: Cardiology

## 2023-05-20 ENCOUNTER — Ambulatory Visit: Payer: Medicare HMO | Attending: Cardiology | Admitting: Cardiology

## 2023-05-20 VITALS — BP 112/72 | HR 63 | Resp 16 | Ht 65.0 in | Wt 167.0 lb

## 2023-05-20 DIAGNOSIS — E782 Mixed hyperlipidemia: Secondary | ICD-10-CM

## 2023-05-20 DIAGNOSIS — I10 Essential (primary) hypertension: Secondary | ICD-10-CM

## 2023-05-20 MED ORDER — LISINOPRIL-HYDROCHLOROTHIAZIDE 10-12.5 MG PO TABS: 1.0000 | ORAL_TABLET | Freq: Every day | ORAL | 3 refills | Status: DC

## 2023-05-20 NOTE — Patient Instructions (Signed)
Medication Instructions:  Lisopril has been refilled *If you need a refill on your cardiac medications before your next appointment, please call your pharmacy*  You have been referred to Lipid clinic to be seen in May 2025   Lab Work: To be completed in April: BMP and Lipid panel If you have labs (blood work) drawn today and your tests are completely normal, you will receive your results only by: MyChart Message (if you have MyChart) OR A paper copy in the mail If you have any lab test that is abnormal or we need to change your treatment, we will call you to review the results.   Follow-Up: At [facility], you and your health needs are our priority.  As part of our continuing mission to provide you with exceptional heart care, we have created designated Provider Care Teams.  These Care Teams include your primary Cardiologist (physician) and Advanced Practice Providers (APPs -  Physician Assistants and Nurse Practitioners) who all work together to provide you with the care you need, when you need it.  We recommend signing up for the patient portal called "MyChart".  Sign up information is provided on this After Visit Summary.  MyChart is used to connect with patients for Virtual Visits (Telemedicine).  Patients are able to view lab/test results, encounter notes, upcoming appointments, etc.  Non-urgent messages can be sent to your provider as well.   To learn more about what you can do with MyChart, go to [email or web address].    Your next appointment:   1 year(s)  Provider:   Elder Negus, MD     Other Instructions   1st Floor: - Lobby - Registration  - Pharmacy  - Lab - Cafe  2nd Floor: - PV Lab - Diagnostic Testing (echo, CT, nuclear med)  3rd Floor: - Vacant  4th Floor: - TCTS (cardiothoracic surgery) - AFib Clinic - Structural Heart Clinic - Vascular Surgery  - Vascular Ultrasound  5th Floor: - HeartCare Cardiology (general and EP) -  Clinical Pharmacy for coumadin, hypertension, lipid, weight-loss medications, and med management appointments    Valet parking services will be available as well.

## 2023-05-20 NOTE — Progress Notes (Signed)
Cardiology Office Note:  .   Date:  05/20/2023  ID:  Krystal Gibson, Krystal Gibson 1955/06/04, MRN [id number] PCP: Noberto Retort, MD   HeartCare Providers Cardiologist:  Truett Mainland, MD PCP: Noberto Retort, MD  Chief Complaint  Patient presents with   Mixed hyperlipidemia   Follow-up    6 months      History of Present Illness: .    Krystal Gibson is a 68 y.o. female with hypertension, hyperlipidemia, family h/oCAD, irritable bowel syndrome.   Patient lost 2 of her siblings in last 6 months.  As result, she has been under depression.  During the depression phase, she unfortunately started smoking back again.  However, she is cut down significantly now, smokes about 1 cigarette in 2 days.  She denies any chest pain or shortness of breath, palpitation symptoms.  Thus far, she has received 2 injections of Leqvio 3 months apart.  Next injection is due in February 2025.  Reviewed lipid results with the patient, details below.  Vitals:   05/20/23 1303  BP: 112/72  Pulse: 63  Resp: 16  SpO2: 96%     ROS:  Review of Systems  Cardiovascular:  Negative for chest pain, dyspnea on exertion, leg swelling, palpitations and syncope.     Studies Reviewed: Marland Kitchen        EKG 05/20/2023: Normal sinus rhythm Left axis deviation Low voltage QRS No recent EKG available for comparisona  Independently interpreted 04/2023: Chol 219, TG 108, HDL 51, LDL 149  10/2022: Chol 186, TG 143, HDL 51, LDL 110 HbA1C 5.8%  02/2022: Cr 0.7    Physical Exam:   Physical Exam Vitals and nursing note reviewed.  Constitutional:      General: She is not in acute distress. Neck:     Vascular: No JVD.  Cardiovascular:     Rate and Rhythm: Normal rate and regular rhythm.     Heart sounds: Normal heart sounds. No murmur heard. Pulmonary:     Effort: Pulmonary effort is normal.     Breath sounds: Normal breath sounds. No wheezing or rales.  Musculoskeletal:     Right lower leg:  No edema.     Left lower leg: No edema.      VISIT DIAGNOSES:   ICD-10-CM   1. Essential hypertension  I10 EKG 12-Lead    Basic metabolic panel    lisinopril-hydrochlorothiazide (ZESTORETIC) 10-12.5 MG tablet    Basic metabolic panel    2. Mixed hyperlipidemia  E78.2 Lipid panel    AMB Referral to [facility]    Lipid panel       ASSESSMENT AND PLAN: .    Krystal Gibson is a 68 y.o. female with hypertension, hyperlipidemia, elevated coronary calcium score   Hypertension: Well-controlled.   Refill lisinopril-hydrochlorothiazide.   Mixed hyperlipidemia, elevated coronary calcium score: Chol 219, TG 108, HDL 51, LDL 149 (04/2023). Chol 186, TG 143, HDL 51, LDL 110 (10/2022). Currently on Leqvio, has only received 2 injections 3 months apart so far.  Next injection due in February.  Will repeat lipid panel in 07/2023.  If LDL remains elevated >70, I will have her see lipid clinic for consideration for Leqvio failure and initiation of PCSK9i.  I encouraged her to continue working on smoking cessation, down to 1 cigarette every 2 days.   Meds ordered this encounter  Medications   lisinopril-hydrochlorothiazide (ZESTORETIC) 10-12.5 MG tablet    Sig: Take 1 tablet by mouth daily.  Dispense:  90 tablet    Refill:  3     F/u in 1 year  Signed, Elder Negus, MD

## 2023-05-27 DIAGNOSIS — H524 Presbyopia: Secondary | ICD-10-CM | POA: Diagnosis not present

## 2023-05-27 DIAGNOSIS — H52223 Regular astigmatism, bilateral: Secondary | ICD-10-CM | POA: Diagnosis not present

## 2023-05-28 ENCOUNTER — Telehealth: Payer: Self-pay | Admitting: *Deleted

## 2023-05-28 ENCOUNTER — Telehealth: Payer: Self-pay

## 2023-05-28 NOTE — Telephone Encounter (Signed)
-----   Message from Rea SAILOR sent at 05/27/2023  9:39 AM EST ----- Regarding: RE: REFER TO PHARMACIST IN LIPID CLINIC PER PATWARDHAN Patient scheduled for 08/26/23 with PharmD - RICK ----- Message ----- From: Gladis Porter HERO, LPN Sent: 8/72/7974   1:56 PM EST To: Barnie JONETTA Pinal; Arland HERO Domino; Leah Newnam; # Subject: REFER TO PHARMACIST IN LIPID CLINIC PER PATW#  Dr. Elmira referred this pt to lipid clinic today in clinic  She was suppose to see pharmacist in lipid clinic for sometime in May.  She was sent to check out but looks like it was overlooked and not scheduled  Can you please call the pt and schedule this then shoot me the date thereafter?   Thanks [employer]

## 2023-05-28 NOTE — Telephone Encounter (Signed)
 Pharmacy Patient Advocate Encounter   Received notification from CoverMyMeds that prior authorization for Dicyclomine  10 mg capsule is required/requested.   Insurance verification completed.   The patient is insured through [employer] [facility] .   Per test claim: PA required; PA started via CoverMyMeds. KEY [id number] . Waiting for clinical questions to populate.

## 2023-06-11 NOTE — Telephone Encounter (Signed)
 Dee with Cover[employer] ([employer]) is calling needing confirmation regarding a shipment of this medication. She reports a request was received from [place] on 02/14, but they have record of a shipment being sent in January.   Please advise.

## 2023-06-17 ENCOUNTER — Ambulatory Visit (INDEPENDENT_AMBULATORY_CARE_PROVIDER_SITE_OTHER): Payer: Medicare HMO

## 2023-06-17 VITALS — BP 148/85 | HR 67 | Temp 98.0°F | Ht 65.0 in | Wt 166.8 lb

## 2023-06-17 DIAGNOSIS — E782 Mixed hyperlipidemia: Secondary | ICD-10-CM

## 2023-06-17 MED ORDER — INCLISIRAN SODIUM 284 MG/1.5ML ~~LOC~~ SOSY
284.0000 mg | PREFILLED_SYRINGE | Freq: Once | SUBCUTANEOUS | Status: AC
  Administered 2023-06-17: 284 mg via SUBCUTANEOUS
  Filled 2023-06-17: qty 1.5

## 2023-06-17 NOTE — Progress Notes (Signed)
 Diagnosis: Hyperlipidemia  Provider:  Chilton Greathouse MD  Procedure: Injection  Leqvio (inclisiran), Dose: 284 mg, Site: subcutaneous, Number of injections: 1  Injection Site(s): Left upper quad. abdomen  Post Care:     Discharge: Condition: Good, Destination: Home . AVS Declined  Performed by:  Garnette Czech, RN

## 2023-06-26 ENCOUNTER — Telehealth: Payer: Self-pay

## 2023-06-26 NOTE — Telephone Encounter (Signed)
 Pharmacy Patient Advocate Encounter   Received notification from CoverMyMeds that prior authorization for Dicyclomine HCl 10MG  capsules is required/requested.   Insurance verification completed.   The patient is insured through [employer] [facility] .   Per test claim: PA required; PA submitted to above mentioned insurance via CoverMyMeds Key/confirmation #/EOC [facility] Status is pending

## 2023-06-27 ENCOUNTER — Other Ambulatory Visit (HOSPITAL_COMMUNITY): Payer: Self-pay

## 2023-06-27 ENCOUNTER — Encounter: Payer: Self-pay | Admitting: Cardiology

## 2023-06-27 NOTE — Telephone Encounter (Signed)
 Pharmacy Patient Advocate Encounter  Received notification from [employer] [facility] that Prior Authorization for Dicyclomine 10 mg has been APPROVED from 04/24/23 to 04/22/24. Ran test claim, Copay is $6.94. This test claim was processed through [facility]- copay amounts may vary at other pharmacies due to pharmacy/plan contracts, or as the patient moves through the different stages of their insurance plan.   PA #/Case ID/Reference #: [id number]

## 2023-08-12 DIAGNOSIS — R109 Unspecified abdominal pain: Secondary | ICD-10-CM | POA: Diagnosis not present

## 2023-08-12 DIAGNOSIS — R194 Change in bowel habit: Secondary | ICD-10-CM | POA: Diagnosis not present

## 2023-08-12 DIAGNOSIS — R195 Other fecal abnormalities: Secondary | ICD-10-CM | POA: Diagnosis not present

## 2023-08-12 DIAGNOSIS — I10 Essential (primary) hypertension: Secondary | ICD-10-CM | POA: Diagnosis not present

## 2023-08-12 DIAGNOSIS — R634 Abnormal weight loss: Secondary | ICD-10-CM | POA: Diagnosis not present

## 2023-08-12 DIAGNOSIS — E782 Mixed hyperlipidemia: Secondary | ICD-10-CM | POA: Diagnosis not present

## 2023-08-12 DIAGNOSIS — K219 Gastro-esophageal reflux disease without esophagitis: Secondary | ICD-10-CM | POA: Diagnosis not present

## 2023-08-13 ENCOUNTER — Encounter: Payer: Self-pay | Admitting: Cardiology

## 2023-08-13 LAB — BASIC METABOLIC PANEL WITH GFR
BUN/Creatinine Ratio: 20 (ref 12–28)
BUN: 14 mg/dL (ref 8–27)
CO2: 26 mmol/L (ref 20–29)
Calcium: 9.7 mg/dL (ref 8.7–10.3)
Chloride: 103 mmol/L (ref 96–106)
Creatinine, Ser: 0.69 mg/dL (ref 0.57–1.00)
Glucose: 92 mg/dL (ref 70–99)
Potassium: 4.1 mmol/L (ref 3.5–5.2)
Sodium: 143 mmol/L (ref 134–144)
eGFR: 94 mL/min/{1.73_m2} (ref >59)

## 2023-08-13 LAB — LIPID PANEL
Chol/HDL Ratio: 3.9 ratio (ref 0.0–4.4)
Cholesterol, Total: 203 mg/dL — ABNORMAL HIGH (ref 100–199)
HDL: 52 mg/dL (ref >39)
LDL Chol Calc (NIH): 129 mg/dL — ABNORMAL HIGH (ref 0–99)
Triglycerides: 122 mg/dL (ref 0–149)
VLDL Cholesterol Cal: 22 mg/dL (ref 5–40)

## 2023-08-13 NOTE — Progress Notes (Signed)
 Her LDL is still elevated, she has already been referred to lipid clinic and has an appointment coming up with [facility], [facility].

## 2023-08-19 DIAGNOSIS — R634 Abnormal weight loss: Secondary | ICD-10-CM | POA: Diagnosis not present

## 2023-08-19 DIAGNOSIS — R8281 Pyuria: Secondary | ICD-10-CM | POA: Diagnosis not present

## 2023-08-19 DIAGNOSIS — R195 Other fecal abnormalities: Secondary | ICD-10-CM | POA: Diagnosis not present

## 2023-08-26 ENCOUNTER — Encounter: Payer: Self-pay | Admitting: Pharmacist

## 2023-08-26 ENCOUNTER — Other Ambulatory Visit (HOSPITAL_COMMUNITY): Payer: Self-pay

## 2023-08-26 ENCOUNTER — Telehealth: Payer: Self-pay | Admitting: Pharmacy Technician

## 2023-08-26 ENCOUNTER — Ambulatory Visit: Attending: Cardiovascular Disease | Admitting: Pharmacist

## 2023-08-26 ENCOUNTER — Ambulatory Visit: Payer: Medicare HMO

## 2023-08-26 ENCOUNTER — Telehealth: Payer: Self-pay | Admitting: Pharmacist

## 2023-08-26 ENCOUNTER — Encounter: Payer: Self-pay | Admitting: Cardiology

## 2023-08-26 VITALS — BP 124/75

## 2023-08-26 DIAGNOSIS — E782 Mixed hyperlipidemia: Secondary | ICD-10-CM

## 2023-08-26 MED ORDER — ROSUVASTATIN CALCIUM 10 MG PO TABS: 10.0000 mg | ORAL_TABLET | Freq: Every day | ORAL | 3 refills | Status: DC

## 2023-08-26 NOTE — Telephone Encounter (Signed)
 Pharmacy Patient Advocate Encounter  Received notification from [employer] that Prior Authorization for repatha has been APPROVED from 04/24/23 to 04/22/24. Spoke to pharmacy to process.Copay is $144.24.    PA #/Case ID/Reference #: [id number]

## 2023-08-26 NOTE — Progress Notes (Addendum)
 Patient ID: Krystal Gibson                 DOB: 1955-08-10                    MRN: [id number]      HPI: Krystal Gibson is a 68 y.o. female patient referred to lipid clinic by Dr.Patwardhan. PMH is significant for CAD, hypertension, elevated CAC score, T2DM.  Due to multiple statin intolerance patient was put on Leqvio . LDL did not improve patient was referred to lipid clinic   In the past she has tried various statins they caused hair loss and headache. She doe not recall all names but she recalls name of the last statins she was on before Leqvio . It was rosuvastatin  40 mg and then she tried lower dose 20 mg. Her sister and mother all have cholesterol problem. She dose not know their LDL levels but her use to >409 in the past. Using [place] criteria she gets 3 points indicates possible FH. So far she had 3 injections of Leqvio  and her LDL went down to only 20 points. Patient is in agreement to get detailed lipid test like NMR, ApoB and Lp(a). We will base line uric acid too for Nexlizet . Patient was diabetic but now her A1c in prediabetic range with lifestyle.  Patient does not want to do self injections -doesn't like needles  Reviewed options for lowering LDL cholesterol, including, low dose statins ezetimibe , bempedoic acid. Discussed mechanisms of action, dosing, side effects and potential decreases in LDL cholesterol.  Also reviewed cost information and potential options for patient assistance.  Current Medications: Leqvio   Intolerances: rosuvastatin  20 mg and 40 mg, Lipitor 80 mg and pravastatin  - hair loss and headache  Risk Factors: CAD, hypertension, elevated CAC score,family hx of hyperlipidemia and hx of ASCVD, LDL c level 271 (7 yrs ago)  indicate  possible FH ([place] criteria for FH) LDL goal: <70 Last Lab : LDL 129, TG 122, TC 203,HDL 52   Diet: due to stomach problem not been able to eat much.  Toast in the morning.Meat is mainly broil or grilled. Can not tolerate raw salads  but able to eat steamed vegetables (broccoli, beans, cauliflower)  Reduced on sodas, only drinks water .   Exercise: had knee replacement was in 2023  Starches 15 min every morning and before going to bed.   Family History:  Mother (Deceased) Cervical cancer   Diabetes   Hyperlipidemia     Father (Deceased)   Sister - Krystal Gibson ( alive) correction requested by patient  Sister Krystal Gibson is ( deseased) 02/04/2023  Brother Rosevelt Constable (Deseased) 07/30/2022   CAD Coronary stent in 101s    Sister - Krystal Gibson (Alive) Diverticulosis     Brother - Rosevelt Constable (Alive) correction from patient    Brother Rosevelt Constable (Deseased) 07/30/2022   Diabetes   Hyperlipidemia   Kidney disease     Other Breast cancer     Son Krystal Gibson [facility]) Multiple sclerosis   Seizures      Social History:  Alcohol: none  Smoking: started back - 2 cig per day   Labs:  Lipid Panel     Component Value Date/Time   CHOL 203 (H) 08/12/2023 1006   TRIG 122 08/12/2023 1006   HDL 52 08/12/2023 1006   CHOLHDL 3.9 08/12/2023 1006   CHOLHDL 7.6 (H) 11/10/2015 1158   VLDL 21 11/10/2015 1158   LDLCALC 129 (H) 08/12/2023 1006   LDLDIRECT 254.5 11/26/2006 0941  LABVLDL 22 08/12/2023 1006    Past Medical History:  Diagnosis Date   Allergy    Anxiety    Arthritis    AVM (arteriovenous malformation)    of esophagus   Colitis    Colon polyp    Depression    Diverticulitis    DJD (degenerative joint disease)    Hyperlipidemia    Hypertension    IBS (irritable bowel syndrome)     Current Outpatient Medications on File Prior to Visit  Medication Sig Dispense Refill   predniSONE (DELTASONE) 10 MG tablet Take 10 mg by mouth daily with breakfast. For 8 days ( starting with 40 mg daily for 2 days then reduce dose by 10 mg every 3rd day till finish)     amLODipine  (NORVASC ) 5 MG tablet Take 1 tablet (5 mg total) by mouth daily. 90 tablet 1   cholecalciferol (VITAMIN D) 1000 UNITS tablet Take 1,000 Units by mouth daily.      dicyclomine  (BENTYL ) 10 MG capsule Take 1 capsule (10 mg total) by mouth 3 (three) times daily before meals. 90 capsule 11   inclisiran (LEQVIO ) 284 MG/1.5ML SOSY injection Inject 284 mg into the skin once.     lisinopril -hydrochlorothiazide  (ZESTORETIC ) 10-12.5 MG tablet Take 1 tablet by mouth daily. 90 tablet 3   nicotine  polacrilex (NICORETTE ) 2 MG gum Take 1 each (2 mg total) by mouth as needed for smoking cessation. 100 tablet 1   pantoprazole  (PROTONIX ) 40 MG tablet Take 1 tablet (40 mg total) by mouth daily. 90 tablet 3   pregabalin (LYRICA) 100 MG capsule Take 100 mg by mouth 2 (two) times daily.     No current facility-administered medications on file prior to visit.    Allergies  Allergen Reactions   Sulfa Antibiotics Swelling    Eyes swollen and redness    Assessment/Plan:  1. Hyperlipidemia -  Problem  Mixed Hyperlipidemia   Current Medications: Leqvio   Intolerances: rosuvastatin  20 mg and 40 mg, Lipitor 80 mg and pravastatin  - hair loss and headache  Risk Factors: CAD, hypertension, elevated CAC score,family hx of hyperlipidemia and hx of ASCVD, LDL c level 271 (7 yrs ago)  indicate  possible FH ([place] criteria for FH) LDL goal: <70 Last Lab : LDL 129, TG 122, TC 203,HDL 52     Mixed hyperlipidemia Assessment:  LDL goal: < 70 mg/dl last LDLc 811 mg/dl (91/4782) after 3 doses of Leqvio   Leqvio  lowered LDL about 20 points  Intolerance to statins multiple statins in the past - mainly due to hair loss and headache  Discussed next potential options (low dose statin PCSK-9 inhibitors, bempedoic acid and inclisiran); cost, dosing efficacy, side effects  Patient does not want to do self injection - des not like to deal with needles   Plan: Just received Leqvio  dose so not due till fall 2025   Patient is in agreement to re try Crestor  lower dose - will start at 10 mg daily if not tolerated well will reduce to 5 mg daily or even every other day - f/u via phone in a month   Coverage assessment for Nexlizet  done - it is covered  Will get base line uric acid level, once we find most tolerable statin dose we will start Nexlizet  180/10 mg daily  Given poor response to inclisiran Will get NMR, Apo B and Lp(a) to get better understanding on lipid profile     Thank you,  Nickola Baron, Pharm.D [facility] A Division of  [facility] [address] [place], [place] [address]  Phone: [phone]; Fax: [phone]

## 2023-08-26 NOTE — Telephone Encounter (Signed)
 Ran test claim for nexlizet . For a 30 day supply and the co-pay is 103.26 . PA is not needed at this time. Nothing saying this is a transition fill. This test claim was processed through [facility]- copay amounts may vary at other pharmacies due to pharmacy/plan contracts, or as the patient moves through the different stages of their insurance plan.

## 2023-08-26 NOTE — Telephone Encounter (Signed)
 Pharmacy Patient Advocate Encounter   Received notification from Pt Calls Messages-Vaishali that prior authorization for repatha is required/requested.   Insurance verification completed.   The patient is insured through [employer] .   Per test claim: PA required; PA submitted to above mentioned insurance via CoverMyMeds Key/confirmation #/EOC [id number] Status is pending

## 2023-08-26 NOTE — Telephone Encounter (Signed)
 PA assessment done for Nexlizet  see other encounter for more detail

## 2023-08-26 NOTE — Telephone Encounter (Signed)
 Pt returning call. Please advise.

## 2023-08-26 NOTE — Telephone Encounter (Signed)
 30 day's not 28 days *

## 2023-08-26 NOTE — Assessment & Plan Note (Signed)
 Assessment:  LDL goal: < 70 mg/dl last LDLc 213 mg/dl (11/6576) after 3 doses of Leqvio   Leqvio  lowered LDL about 20 points  Intolerance to statins multiple statins in the past - mainly due to hair loss and headache  Discussed next potential options (low dose statin PCSK-9 inhibitors, bempedoic acid and inclisiran); cost, dosing efficacy, side effects  Patient does not want to do self injection - des not like to deal with needles   Plan: Just received Leqvio  dose so not due till fall 2025   Patient is in agreement to re try Crestor  lower dose - will start at 10 mg daily if not tolerated well will reduce to 5 mg daily or even every other day - f/u via phone in a month  Coverage assessment for Nexlizet  done - it is covered  Will get base line uric acid level, once we find most tolerable statin dose we will start Nexlizet  180/10 mg daily  Given poor response to inclisiran Will get NMR, Apo B and Lp(a) to get better understanding on lipid profile

## 2023-08-26 NOTE — Patient Instructions (Addendum)
 Your Results:             Your most recent labs Goal  Total Cholesterol 203 < 200  Triglycerides 122 < 150  HDL (happy/good cholesterol) 52 > 40  LDL (lousy/bad cholesterol 129 < 70   Medication changes: start taking Rosuvastatin  10 mg daily if not tolerated reduce dose to 5 mg daily or even 5 mg every other day   Lab orders: Please get lab done on Monday 09/02/2023

## 2023-08-27 ENCOUNTER — Telehealth: Payer: Self-pay | Admitting: Pharmacy Technician

## 2023-08-27 ENCOUNTER — Encounter: Payer: Self-pay | Admitting: Pharmacy Technician

## 2023-08-27 DIAGNOSIS — E782 Mixed hyperlipidemia: Secondary | ICD-10-CM

## 2023-08-27 NOTE — Telephone Encounter (Signed)
 Patient Advocate Encounter   The patient was approved for a [employer] grant that will help cover the cost of nexlizet  Total amount awarded, 2500.  Effective: 07/28/23 - 07/26/24   [id number] [id number] [employer] ID: [id number]   Pharmacy provided with approval and processing information. Patient informed via mychart

## 2023-08-29 DIAGNOSIS — M5412 Radiculopathy, cervical region: Secondary | ICD-10-CM | POA: Diagnosis not present

## 2023-08-30 NOTE — Telephone Encounter (Signed)
 Spoke to patient, patient took Crestor  for few days now, no headache, will continue taking it and will start Nexlizet  from Monday May 12,2025. Will go for baseline lab on Monday 12,2025.

## 2023-09-03 LAB — LIPOPROTEIN A (LPA): Lipoprotein (a): 266.4 nmol/L — ABNORMAL HIGH (ref <75.0)

## 2023-09-03 LAB — NMR, LIPOPROFILE
Cholesterol, Total: 149 mg/dL (ref 100–199)
HDL Particle Number: 33.5 umol/L (ref >=30.5)
HDL-C: 57 mg/dL (ref >39)
LDL Particle Number: 1125 nmol/L — ABNORMAL HIGH (ref <1000)
LDL Size: 20.5 nm — ABNORMAL LOW (ref >20.5)
LDL-C (NIH Calc): 76 mg/dL (ref 0–99)
LP-IR Score: 47 — ABNORMAL HIGH (ref <=45)
Small LDL Particle Number: 563 nmol/L — ABNORMAL HIGH (ref <=527)
Triglycerides: 86 mg/dL (ref 0–149)

## 2023-09-03 LAB — URIC ACID: Uric Acid: 3.9 mg/dL (ref 3.0–7.2)

## 2023-09-03 LAB — APOLIPOPROTEIN B: Apolipoprotein B: 78 mg/dL (ref <90)

## 2023-09-04 ENCOUNTER — Ambulatory Visit: Payer: Self-pay | Admitting: Pharmacist

## 2023-09-04 MED ORDER — NEXLIZET 180-10 MG PO TABS: 1.0000 | ORAL_TABLET | Freq: Every day | ORAL | 11 refills | Status: AC

## 2023-09-04 NOTE — Telephone Encounter (Signed)
 Updated NMR, Uric acid and Lp(a) result discussed with pt. Starting Nexlizet  will repeat lab July 31,2025 seems patient is not responding to Leqvio . May want to consider PCSK9i if LDL remain above goal in 2-3 months.

## 2023-09-10 DIAGNOSIS — K6389 Other specified diseases of intestine: Secondary | ICD-10-CM | POA: Diagnosis not present

## 2023-09-10 DIAGNOSIS — D120 Benign neoplasm of cecum: Secondary | ICD-10-CM | POA: Diagnosis not present

## 2023-09-10 DIAGNOSIS — R194 Change in bowel habit: Secondary | ICD-10-CM | POA: Diagnosis not present

## 2023-09-10 DIAGNOSIS — K5730 Diverticulosis of large intestine without perforation or abscess without bleeding: Secondary | ICD-10-CM | POA: Diagnosis not present

## 2023-09-12 DIAGNOSIS — D120 Benign neoplasm of cecum: Secondary | ICD-10-CM | POA: Diagnosis not present

## 2023-10-24 ENCOUNTER — Encounter: Payer: Self-pay | Admitting: Cardiology

## 2023-10-24 NOTE — Telephone Encounter (Signed)
 Please review and advise.

## 2023-10-24 NOTE — Telephone Encounter (Signed)
 Only other option would be to consider injectable agents. This would  likely provide the best LDL reduction and cardiovascular risk reduction, but I understand patient has been reluctant in the past. If she is interested, please refer to lipid clinic.  Thanks MJP

## 2023-10-28 NOTE — Telephone Encounter (Signed)
 fyi

## 2023-10-28 NOTE — Telephone Encounter (Signed)
 If there is genetic component to it, it can be hard to control in spite of diet and lifestyle modifications.  That said, strongly agree with continued heart healthy diet and lifestyle changes.  Thanks MJP

## 2023-11-11 ENCOUNTER — Other Ambulatory Visit: Payer: Self-pay | Admitting: Family Medicine

## 2023-11-11 ENCOUNTER — Telehealth: Payer: Self-pay | Admitting: Cardiology

## 2023-11-11 DIAGNOSIS — Z1231 Encounter for screening mammogram for malignant neoplasm of breast: Secondary | ICD-10-CM

## 2023-11-11 NOTE — Telephone Encounter (Signed)
 Spoke with pt regarding lab orders. Pt was told that she does not need lab orders to go to [facility]. It was explained to the pt that she will be in the system and the lab techs will know what to draw. Pt verbalized understanding. All questions if any were answered.

## 2023-11-11 NOTE — Telephone Encounter (Signed)
 Patient is requesting written copies of lab orders. She would like to know if they can be e-mailed if possible. Please advise.

## 2023-11-25 DIAGNOSIS — E782 Mixed hyperlipidemia: Secondary | ICD-10-CM | POA: Diagnosis not present

## 2023-11-26 LAB — LIPID PANEL
Chol/HDL Ratio: 4.9 ratio — ABNORMAL HIGH (ref 0.0–4.4)
Cholesterol, Total: 236 mg/dL — ABNORMAL HIGH (ref 100–199)
HDL: 48 mg/dL (ref >39)
LDL Chol Calc (NIH): 165 mg/dL — ABNORMAL HIGH (ref 0–99)
Triglycerides: 129 mg/dL (ref 0–149)
VLDL Cholesterol Cal: 23 mg/dL (ref 5–40)

## 2023-11-26 LAB — URIC ACID: Uric Acid: 4.6 mg/dL (ref 3.0–7.2)

## 2023-11-27 ENCOUNTER — Telehealth: Payer: Self-pay | Admitting: Pharmacist

## 2023-11-27 ENCOUNTER — Ambulatory Visit: Payer: Self-pay | Admitting: Pharmacist

## 2023-11-27 NOTE — Telephone Encounter (Signed)
 At 1st visit me on 08/26/23, Crestor  10 mg daily was added to Leqvio , which helped reduce LDL cholesterol; however, the patient experienced headaches and was unable to tolerate it. Consequently, Crestor  was switched to Nexlizet .  The patient later decided to rechallenge herself with Crestor  and was taking both Crestor  and Nexlizet  concurrently for three months. Approximately 3-4 weeks prior to her recent labs, her headaches returned, leading her to stop both cholesterol medications.  She is due for her next Leqvio  dose on August 25. The patient is willing to retry Nexlizet  but does not want to restart Crestor . She will begin Nexlizet  again starting today and will contact me if any issues arise. Lipid labs will be rechecked at the end of October.

## 2023-11-27 NOTE — Telephone Encounter (Signed)
 See other encounter from 11/27/23 for more information

## 2023-12-02 ENCOUNTER — Ambulatory Visit
Admission: RE | Admit: 2023-12-02 | Discharge: 2023-12-02 | Disposition: A | Discharge status: Home / Self Care | Source: Ambulatory Visit | Attending: Family Medicine | Admitting: Family Medicine

## 2023-12-02 ENCOUNTER — Ambulatory Visit

## 2023-12-02 DIAGNOSIS — Z1231 Encounter for screening mammogram for malignant neoplasm of breast: Secondary | ICD-10-CM

## 2023-12-16 ENCOUNTER — Ambulatory Visit (INDEPENDENT_AMBULATORY_CARE_PROVIDER_SITE_OTHER): Payer: Medicare HMO

## 2023-12-16 VITALS — BP 119/72 | HR 56 | Temp 98.0°F | Resp 18 | Ht 65.0 in | Wt 165.4 lb

## 2023-12-16 DIAGNOSIS — Z1330 Encounter for screening examination for mental health and behavioral disorders, unspecified: Secondary | ICD-10-CM | POA: Diagnosis not present

## 2023-12-16 DIAGNOSIS — E782 Mixed hyperlipidemia: Secondary | ICD-10-CM

## 2023-12-16 DIAGNOSIS — M5412 Radiculopathy, cervical region: Secondary | ICD-10-CM | POA: Diagnosis not present

## 2023-12-16 DIAGNOSIS — M5013 Cervical disc disorder with radiculopathy, cervicothoracic region: Secondary | ICD-10-CM | POA: Diagnosis not present

## 2023-12-16 MED ORDER — INCLISIRAN SODIUM 284 MG/1.5ML ~~LOC~~ SOSY
284.0000 mg | PREFILLED_SYRINGE | Freq: Once | SUBCUTANEOUS | Status: AC
  Administered 2023-12-16: 284 mg via SUBCUTANEOUS
  Filled 2023-12-16: qty 1.5

## 2023-12-16 NOTE — Progress Notes (Signed)
 Diagnosis: Hyperlipidemia  Provider:  Chilton Greathouse MD  Procedure: Injection  Leqvio (inclisiran), Dose: 284 mg, Site: subcutaneous, Number of injections: 1  Injection Site(s): Right lower quad. abdomne  Post Care: Patient declined observation  Discharge: Condition: Good, Destination: Home . AVS Provided  Performed by:  Adriana Mccallum, RN

## 2024-01-09 ENCOUNTER — Other Ambulatory Visit: Payer: Self-pay | Admitting: Gastroenterology

## 2024-01-09 DIAGNOSIS — R634 Abnormal weight loss: Secondary | ICD-10-CM | POA: Diagnosis not present

## 2024-01-09 DIAGNOSIS — R1084 Generalized abdominal pain: Secondary | ICD-10-CM

## 2024-01-09 DIAGNOSIS — K219 Gastro-esophageal reflux disease without esophagitis: Secondary | ICD-10-CM | POA: Diagnosis not present

## 2024-01-09 DIAGNOSIS — K580 Irritable bowel syndrome with diarrhea: Secondary | ICD-10-CM | POA: Diagnosis not present

## 2024-01-13 ENCOUNTER — Ambulatory Visit
Admission: RE | Admit: 2024-01-13 | Discharge: 2024-01-13 | Disposition: A | Discharge status: Home / Self Care | Source: Ambulatory Visit | Attending: Gastroenterology

## 2024-01-13 DIAGNOSIS — R1084 Generalized abdominal pain: Secondary | ICD-10-CM

## 2024-01-13 DIAGNOSIS — K5730 Diverticulosis of large intestine without perforation or abscess without bleeding: Secondary | ICD-10-CM | POA: Diagnosis not present

## 2024-01-13 MED ORDER — IOPAMIDOL (ISOVUE-370) INJECTION 76%
75.0000 mL | Freq: Once | INTRAVENOUS | Status: AC | PRN
  Administered 2024-01-13: 75 mL via INTRAVENOUS

## 2024-02-14 DIAGNOSIS — N63 Unspecified lump in unspecified breast: Secondary | ICD-10-CM | POA: Diagnosis not present

## 2024-02-14 DIAGNOSIS — L729 Follicular cyst of the skin and subcutaneous tissue, unspecified: Secondary | ICD-10-CM | POA: Diagnosis not present

## 2024-02-17 ENCOUNTER — Telehealth: Payer: Self-pay | Admitting: Pharmacist

## 2024-02-17 NOTE — Telephone Encounter (Signed)
 F/u lipid lab due - call pt to remind lipid lab - pt has physical due on Nov 3 so will get lab done at PCP and will share with us .

## 2024-02-27 NOTE — Addendum Note (Signed)
 Addended by: Daquann Merriott K on: 02/27/2024 09:29 AM   Modules accepted: Orders

## 2024-02-27 NOTE — Telephone Encounter (Signed)
 Lab result at PCP - TC 137, TG 116, LDLc 65, HDL 51. Result discussed over the phone. LDLc at goal with Nexlizet  and Leqvio  advised to continue therapy.

## 2024-03-16 DIAGNOSIS — L820 Inflamed seborrheic keratosis: Secondary | ICD-10-CM | POA: Diagnosis not present

## 2024-03-16 DIAGNOSIS — M5412 Radiculopathy, cervical region: Secondary | ICD-10-CM | POA: Diagnosis not present

## 2024-03-16 DIAGNOSIS — L929 Granulomatous disorder of the skin and subcutaneous tissue, unspecified: Secondary | ICD-10-CM | POA: Diagnosis not present

## 2024-03-16 DIAGNOSIS — L821 Other seborrheic keratosis: Secondary | ICD-10-CM | POA: Diagnosis not present

## 2024-03-17 ENCOUNTER — Encounter: Payer: Self-pay | Admitting: Cardiology

## 2024-05-13 ENCOUNTER — Encounter: Payer: Self-pay | Admitting: Cardiology

## 2024-05-18 ENCOUNTER — Telehealth: Payer: Self-pay | Admitting: Pharmacy Technician

## 2024-05-18 NOTE — Telephone Encounter (Signed)
 Patient has been approve for NPAF. 04/23/24 - 04/22/25.  Auth Submission: APPROVED Site of care: Site of care: [facility] Payer: [employer] Medication & CPT/J Code(s) submitted: Leqvio  (Inclisiran) J1306 Diagnosis Code: E78.5 Route of submission (phone, fax, portal): PAP Phone # Fax # Auth type:  Units/visits requested: [id number] Reference number:  Approval from: 04/23/24 to 04/22/25

## 2024-05-20 ENCOUNTER — Other Ambulatory Visit: Payer: Self-pay | Admitting: Cardiology

## 2024-05-20 DIAGNOSIS — I10 Essential (primary) hypertension: Secondary | ICD-10-CM

## 2024-05-21 ENCOUNTER — Telehealth: Payer: Self-pay | Admitting: Cardiology

## 2024-05-21 NOTE — Telephone Encounter (Signed)
 error

## 2024-05-26 MED ORDER — LISINOPRIL-HYDROCHLOROTHIAZIDE 10-12.5 MG PO TABS: 1.0000 | ORAL_TABLET | Freq: Every day | ORAL | 0 refills | Status: AC

## 2024-06-22 ENCOUNTER — Ambulatory Visit

## 2024-08-03 ENCOUNTER — Ambulatory Visit: Admitting: Cardiology
